# Patient Record
Sex: Male | Born: 1955 | Race: White | Hispanic: No | Marital: Single | Smoking: Former smoker
Health system: Southern US, Community
[De-identification: ages and names within clinical notes are randomized; demographics above are authoritative.]

## PROBLEM LIST (undated history)

## (undated) DIAGNOSIS — E785 Hyperlipidemia, unspecified: Secondary | ICD-10-CM

## (undated) DIAGNOSIS — I639 Cerebral infarction, unspecified: Secondary | ICD-10-CM

## (undated) DIAGNOSIS — I480 Paroxysmal atrial fibrillation: Secondary | ICD-10-CM

## (undated) DIAGNOSIS — I2119 ST elevation (STEMI) myocardial infarction involving other coronary artery of inferior wall: Secondary | ICD-10-CM

## (undated) DIAGNOSIS — I2510 Atherosclerotic heart disease of native coronary artery without angina pectoris: Secondary | ICD-10-CM

## (undated) DIAGNOSIS — I5042 Chronic combined systolic (congestive) and diastolic (congestive) heart failure: Secondary | ICD-10-CM

## (undated) DIAGNOSIS — I255 Ischemic cardiomyopathy: Secondary | ICD-10-CM

## (undated) DIAGNOSIS — F17200 Nicotine dependence, unspecified, uncomplicated: Secondary | ICD-10-CM

## (undated) DIAGNOSIS — I10 Essential (primary) hypertension: Secondary | ICD-10-CM

## (undated) DIAGNOSIS — E119 Type 2 diabetes mellitus without complications: Secondary | ICD-10-CM

## (undated) DIAGNOSIS — N183 Chronic kidney disease, stage 3 unspecified: Secondary | ICD-10-CM

## (undated) HISTORY — DX: Chronic kidney disease, stage 3 unspecified: N18.30

## (undated) HISTORY — DX: Ischemic cardiomyopathy: I25.5

## (undated) HISTORY — DX: Hyperlipidemia, unspecified: E78.5

## (undated) HISTORY — DX: Chronic combined systolic (congestive) and diastolic (congestive) heart failure: I50.42

## (undated) HISTORY — PX: CARDIAC CATHETERIZATION: SHX172

---

## 2012-02-19 ENCOUNTER — Ambulatory Visit: Payer: Self-pay | Admitting: Unknown Physician Specialty

## 2012-02-21 LAB — PATHOLOGY REPORT

## 2019-05-19 ENCOUNTER — Emergency Department: Payer: Medicaid Other

## 2019-05-19 ENCOUNTER — Other Ambulatory Visit: Payer: Self-pay

## 2019-05-19 ENCOUNTER — Encounter
Admission: EM | Disposition: A | Discharge status: Skilled Nursing Facility | Payer: Self-pay | Source: Home / Self Care | Attending: Internal Medicine

## 2019-05-19 ENCOUNTER — Inpatient Hospital Stay
Admission: EM | Admit: 2019-05-19 | Discharge: 2019-06-04 | DRG: 250 | Disposition: A | Discharge status: Skilled Nursing Facility | Payer: Medicaid Other | Attending: Internal Medicine | Admitting: Internal Medicine

## 2019-05-19 ENCOUNTER — Encounter: Payer: Self-pay | Admitting: Emergency Medicine

## 2019-05-19 DIAGNOSIS — N179 Acute kidney failure, unspecified: Secondary | ICD-10-CM | POA: Diagnosis present

## 2019-05-19 DIAGNOSIS — I480 Paroxysmal atrial fibrillation: Secondary | ICD-10-CM | POA: Diagnosis present

## 2019-05-19 DIAGNOSIS — Z20828 Contact with and (suspected) exposure to other viral communicable diseases: Secondary | ICD-10-CM | POA: Diagnosis present

## 2019-05-19 DIAGNOSIS — I5021 Acute systolic (congestive) heart failure: Secondary | ICD-10-CM

## 2019-05-19 DIAGNOSIS — R4701 Aphasia: Secondary | ICD-10-CM | POA: Diagnosis present

## 2019-05-19 DIAGNOSIS — N289 Disorder of kidney and ureter, unspecified: Secondary | ICD-10-CM

## 2019-05-19 DIAGNOSIS — I313 Pericardial effusion (noninflammatory): Secondary | ICD-10-CM | POA: Diagnosis present

## 2019-05-19 DIAGNOSIS — R233 Spontaneous ecchymoses: Secondary | ICD-10-CM | POA: Diagnosis present

## 2019-05-19 DIAGNOSIS — E785 Hyperlipidemia, unspecified: Secondary | ICD-10-CM | POA: Diagnosis present

## 2019-05-19 DIAGNOSIS — I2119 ST elevation (STEMI) myocardial infarction involving other coronary artery of inferior wall: Secondary | ICD-10-CM | POA: Diagnosis present

## 2019-05-19 DIAGNOSIS — I10 Essential (primary) hypertension: Secondary | ICD-10-CM

## 2019-05-19 DIAGNOSIS — T508X5A Adverse effect of diagnostic agents, initial encounter: Secondary | ICD-10-CM | POA: Diagnosis present

## 2019-05-19 DIAGNOSIS — I5043 Acute on chronic combined systolic (congestive) and diastolic (congestive) heart failure: Secondary | ICD-10-CM | POA: Diagnosis present

## 2019-05-19 DIAGNOSIS — Z720 Tobacco use: Secondary | ICD-10-CM

## 2019-05-19 DIAGNOSIS — I213 ST elevation (STEMI) myocardial infarction of unspecified site: Secondary | ICD-10-CM

## 2019-05-19 DIAGNOSIS — W19XXXA Unspecified fall, initial encounter: Secondary | ICD-10-CM | POA: Diagnosis present

## 2019-05-19 DIAGNOSIS — N141 Nephropathy induced by other drugs, medicaments and biological substances: Secondary | ICD-10-CM | POA: Diagnosis present

## 2019-05-19 DIAGNOSIS — E872 Acidosis: Secondary | ICD-10-CM | POA: Diagnosis not present

## 2019-05-19 DIAGNOSIS — I6330 Cerebral infarction due to thrombosis of unspecified cerebral artery: Secondary | ICD-10-CM

## 2019-05-19 DIAGNOSIS — E1122 Type 2 diabetes mellitus with diabetic chronic kidney disease: Secondary | ICD-10-CM | POA: Diagnosis present

## 2019-05-19 DIAGNOSIS — I2510 Atherosclerotic heart disease of native coronary artery without angina pectoris: Secondary | ICD-10-CM

## 2019-05-19 DIAGNOSIS — Z23 Encounter for immunization: Secondary | ICD-10-CM | POA: Diagnosis not present

## 2019-05-19 DIAGNOSIS — I255 Ischemic cardiomyopathy: Secondary | ICD-10-CM | POA: Diagnosis present

## 2019-05-19 DIAGNOSIS — I130 Hypertensive heart and chronic kidney disease with heart failure and stage 1 through stage 4 chronic kidney disease, or unspecified chronic kidney disease: Secondary | ICD-10-CM | POA: Diagnosis present

## 2019-05-19 DIAGNOSIS — F17210 Nicotine dependence, cigarettes, uncomplicated: Secondary | ICD-10-CM | POA: Diagnosis present

## 2019-05-19 DIAGNOSIS — G8191 Hemiplegia, unspecified affecting right dominant side: Secondary | ICD-10-CM | POA: Diagnosis present

## 2019-05-19 DIAGNOSIS — G9340 Encephalopathy, unspecified: Secondary | ICD-10-CM | POA: Diagnosis present

## 2019-05-19 DIAGNOSIS — I4891 Unspecified atrial fibrillation: Secondary | ICD-10-CM

## 2019-05-19 DIAGNOSIS — Z9114 Patient's other noncompliance with medication regimen: Secondary | ICD-10-CM

## 2019-05-19 DIAGNOSIS — N1831 Chronic kidney disease, stage 3a: Secondary | ICD-10-CM | POA: Diagnosis present

## 2019-05-19 DIAGNOSIS — N1832 Chronic kidney disease, stage 3b: Secondary | ICD-10-CM

## 2019-05-19 DIAGNOSIS — D631 Anemia in chronic kidney disease: Secondary | ICD-10-CM | POA: Diagnosis present

## 2019-05-19 DIAGNOSIS — N189 Chronic kidney disease, unspecified: Secondary | ICD-10-CM

## 2019-05-19 DIAGNOSIS — R482 Apraxia: Secondary | ICD-10-CM | POA: Diagnosis present

## 2019-05-19 DIAGNOSIS — I63512 Cerebral infarction due to unspecified occlusion or stenosis of left middle cerebral artery: Secondary | ICD-10-CM | POA: Diagnosis present

## 2019-05-19 DIAGNOSIS — R29702 NIHSS score 2: Secondary | ICD-10-CM | POA: Diagnosis present

## 2019-05-19 DIAGNOSIS — I2111 ST elevation (STEMI) myocardial infarction involving right coronary artery: Secondary | ICD-10-CM

## 2019-05-19 DIAGNOSIS — I639 Cerebral infarction, unspecified: Secondary | ICD-10-CM

## 2019-05-19 DIAGNOSIS — I5020 Unspecified systolic (congestive) heart failure: Secondary | ICD-10-CM

## 2019-05-19 HISTORY — DX: Essential (primary) hypertension: I10

## 2019-05-19 HISTORY — DX: ST elevation (STEMI) myocardial infarction involving other coronary artery of inferior wall: I21.19

## 2019-05-19 HISTORY — DX: Nicotine dependence, unspecified, uncomplicated: F17.200

## 2019-05-19 HISTORY — PX: LEFT HEART CATH AND CORONARY ANGIOGRAPHY: CATH118249

## 2019-05-19 HISTORY — PX: CORONARY/GRAFT ACUTE MI REVASCULARIZATION: CATH118305

## 2019-05-19 HISTORY — DX: Cerebral infarction, unspecified: I63.9

## 2019-05-19 HISTORY — PX: CORONARY BALLOON ANGIOPLASTY: CATH118233

## 2019-05-19 HISTORY — DX: Paroxysmal atrial fibrillation: I48.0

## 2019-05-19 HISTORY — DX: Type 2 diabetes mellitus without complications: E11.9

## 2019-05-19 HISTORY — DX: Atherosclerotic heart disease of native coronary artery without angina pectoris: I25.10

## 2019-05-19 LAB — CBC
HCT: 38.3 % — ABNORMAL LOW (ref 39.0–52.0)
Hemoglobin: 13.3 g/dL (ref 13.0–17.0)
MCH: 30.2 pg (ref 26.0–34.0)
MCHC: 34.7 g/dL (ref 30.0–36.0)
MCV: 86.8 fL (ref 80.0–100.0)
Platelets: 304 10*3/uL (ref 150–400)
RBC: 4.41 MIL/uL (ref 4.22–5.81)
RDW: 13.5 % (ref 11.5–15.5)
WBC: 11.7 10*3/uL — ABNORMAL HIGH (ref 4.0–10.5)
nRBC: 0 % (ref 0.0–0.2)

## 2019-05-19 LAB — PROTIME-INR
INR: 1.3 — ABNORMAL HIGH (ref 0.8–1.2)
Prothrombin Time: 16.2 seconds — ABNORMAL HIGH (ref 11.4–15.2)

## 2019-05-19 LAB — URINALYSIS, COMPLETE (UACMP) WITH MICROSCOPIC
Bacteria, UA: NONE SEEN
Bilirubin Urine: NEGATIVE
Glucose, UA: NEGATIVE mg/dL
Hgb urine dipstick: NEGATIVE
Ketones, ur: NEGATIVE mg/dL
Leukocytes,Ua: NEGATIVE
Nitrite: NEGATIVE
Protein, ur: NEGATIVE mg/dL
Specific Gravity, Urine: 1.012 (ref 1.005–1.030)
pH: 5 (ref 5.0–8.0)

## 2019-05-19 LAB — SARS CORONAVIRUS 2 BY RT PCR (HOSPITAL ORDER, PERFORMED IN ~~LOC~~ HOSPITAL LAB): SARS Coronavirus 2: NEGATIVE

## 2019-05-19 LAB — TROPONIN I (HIGH SENSITIVITY)
Troponin I (High Sensitivity): 3929 ng/L (ref <18)
Troponin I (High Sensitivity): 3682 ng/L (ref <18)
Troponin I (High Sensitivity): 3729 ng/L (ref <18)

## 2019-05-19 LAB — COMPREHENSIVE METABOLIC PANEL
ALT: 16 U/L (ref 0–44)
AST: 21 U/L (ref 15–41)
Albumin: 2.9 g/dL — ABNORMAL LOW (ref 3.5–5.0)
Alkaline Phosphatase: 76 U/L (ref 38–126)
Anion gap: 15 (ref 5–15)
BUN: 32 mg/dL — ABNORMAL HIGH (ref 8–23)
CO2: 16 mmol/L — ABNORMAL LOW (ref 22–32)
Calcium: 8.5 mg/dL — ABNORMAL LOW (ref 8.9–10.3)
Chloride: 104 mmol/L (ref 98–111)
Creatinine, Ser: 1.78 mg/dL — ABNORMAL HIGH (ref 0.61–1.24)
GFR calc Af Amer: 46 mL/min — ABNORMAL LOW (ref >60)
GFR calc non Af Amer: 39 mL/min — ABNORMAL LOW (ref >60)
Glucose, Bld: 155 mg/dL — ABNORMAL HIGH (ref 70–99)
Potassium: 3.2 mmol/L — ABNORMAL LOW (ref 3.5–5.1)
Sodium: 135 mmol/L (ref 135–145)
Total Bilirubin: 1 mg/dL (ref 0.3–1.2)
Total Protein: 6.5 g/dL (ref 6.5–8.1)

## 2019-05-19 LAB — POCT ACTIVATED CLOTTING TIME
Activated Clotting Time: 219 seconds
Activated Clotting Time: 235 seconds

## 2019-05-19 LAB — GLUCOSE, CAPILLARY: Glucose-Capillary: 100 mg/dL — ABNORMAL HIGH (ref 70–99)

## 2019-05-19 LAB — CARDIAC CATHETERIZATION: Cath EF Quantitative: 30 %

## 2019-05-19 LAB — APTT: aPTT: >160 seconds — ABNORMAL HIGH (ref 24–36)

## 2019-05-19 LAB — MRSA PCR SCREENING: MRSA by PCR: NEGATIVE

## 2019-05-19 SURGERY — CORONARY/GRAFT ACUTE MI REVASCULARIZATION
Anesthesia: Moderate Sedation

## 2019-05-19 MED ORDER — METOPROLOL TARTRATE 5 MG/5ML IV SOLN
INTRAVENOUS | Status: AC
  Filled 2019-05-19: qty 5

## 2019-05-19 MED ORDER — SODIUM CHLORIDE 0.9% FLUSH: 3.0000 mL | INTRAVENOUS | Status: DC | PRN

## 2019-05-19 MED ORDER — ATORVASTATIN CALCIUM 20 MG PO TABS
80.0000 mg | ORAL_TABLET | Freq: Every day | ORAL | Status: DC
  Administered 2019-05-19: 80 mg via ORAL
  Filled 2019-05-19: qty 4

## 2019-05-19 MED ORDER — SODIUM CHLORIDE 0.9 % IV SOLN: 250.0000 mL | INTRAVENOUS | Status: DC | PRN

## 2019-05-19 MED ORDER — NITROGLYCERIN 1 MG/10 ML FOR IR/CATH LAB
INTRA_ARTERIAL | Status: AC
  Filled 2019-05-19: qty 10

## 2019-05-19 MED ORDER — DILTIAZEM HCL 25 MG/5ML IV SOLN
INTRAVENOUS | Status: AC
  Administered 2019-05-19: 20 mg via INTRAVENOUS
  Filled 2019-05-19: qty 5

## 2019-05-19 MED ORDER — MIDAZOLAM HCL 2 MG/2ML IJ SOLN
INTRAMUSCULAR | Status: DC | PRN
  Administered 2019-05-19: 1 mg via INTRAVENOUS

## 2019-05-19 MED ORDER — DILTIAZEM HCL 25 MG/5ML IV SOLN
20.0000 mg | Freq: Once | INTRAVENOUS | Status: AC
  Administered 2019-05-19: 11:00:00 20 mg via INTRAVENOUS

## 2019-05-19 MED ORDER — VERAPAMIL HCL 2.5 MG/ML IV SOLN
INTRAVENOUS | Status: DC | PRN
  Administered 2019-05-19: 2.5 mg via INTRA_ARTERIAL

## 2019-05-19 MED ORDER — ONDANSETRON HCL 4 MG/2ML IJ SOLN: 4.0000 mg | Freq: Four times a day (QID) | INTRAMUSCULAR | Status: DC | PRN

## 2019-05-19 MED ORDER — HEPARIN SODIUM (PORCINE) 1000 UNIT/ML IJ SOLN
INTRAMUSCULAR | Status: AC
  Filled 2019-05-19: qty 1

## 2019-05-19 MED ORDER — METOPROLOL TARTRATE 5 MG/5ML IV SOLN
INTRAVENOUS | Status: DC | PRN
  Administered 2019-05-19: 2.5 mg via INTRAVENOUS

## 2019-05-19 MED ORDER — NITROGLYCERIN 1 MG/10 ML FOR IR/CATH LAB
INTRA_ARTERIAL | Status: DC | PRN
  Administered 2019-05-19: 200 ug
  Administered 2019-05-19: 200 ug via INTRACORONARY

## 2019-05-19 MED ORDER — ACETAMINOPHEN 325 MG PO TABS: 650.0000 mg | ORAL_TABLET | ORAL | Status: DC | PRN

## 2019-05-19 MED ORDER — ASPIRIN 81 MG PO CHEW
81.0000 mg | CHEWABLE_TABLET | Freq: Every day | ORAL | Status: DC
  Administered 2019-05-19 – 2019-05-22 (×4): 81 mg via ORAL
  Filled 2019-05-19 (×5): qty 1

## 2019-05-19 MED ORDER — HEPARIN (PORCINE) IN NACL 1000-0.9 UT/500ML-% IV SOLN
INTRAVENOUS | Status: AC
  Filled 2019-05-19: qty 1000

## 2019-05-19 MED ORDER — HEPARIN BOLUS VIA INFUSION
4000.0000 [IU] | Freq: Once | INTRAVENOUS | Status: DC
  Filled 2019-05-19: qty 4000

## 2019-05-19 MED ORDER — HEPARIN (PORCINE) IN NACL 1000-0.9 UT/500ML-% IV SOLN
INTRAVENOUS | Status: DC | PRN
  Administered 2019-05-19: 500 mL

## 2019-05-19 MED ORDER — CLOPIDOGREL BISULFATE 75 MG PO TABS
600.0000 mg | ORAL_TABLET | Freq: Once | ORAL | Status: AC
  Administered 2019-05-19: 600 mg via ORAL
  Filled 2019-05-19: qty 8

## 2019-05-19 MED ORDER — MIDAZOLAM HCL 2 MG/2ML IJ SOLN
INTRAMUSCULAR | Status: AC
  Filled 2019-05-19: qty 2

## 2019-05-19 MED ORDER — IOHEXOL 300 MG/ML  SOLN
INTRAMUSCULAR | Status: DC | PRN
  Administered 2019-05-19: 150 mL

## 2019-05-19 MED ORDER — CHLORHEXIDINE GLUCONATE CLOTH 2 % EX PADS
6.0000 | MEDICATED_PAD | Freq: Every day | CUTANEOUS | Status: DC
  Administered 2019-05-21 – 2019-06-03 (×11): 6 via TOPICAL

## 2019-05-19 MED ORDER — SODIUM CHLORIDE 0.9 % IV SOLN: INTRAVENOUS | Status: AC

## 2019-05-19 MED ORDER — CLOPIDOGREL BISULFATE 75 MG PO TABS
75.0000 mg | ORAL_TABLET | Freq: Every day | ORAL | Status: DC
  Administered 2019-05-20 – 2019-06-03 (×15): 75 mg via ORAL
  Filled 2019-05-19 (×15): qty 1

## 2019-05-19 MED ORDER — INFLUENZA VAC SPLIT QUAD 0.5 ML IM SUSY
0.5000 mL | PREFILLED_SYRINGE | INTRAMUSCULAR | Status: AC
  Administered 2019-05-22: 0.5 mL via INTRAMUSCULAR
  Filled 2019-05-19: qty 0.5

## 2019-05-19 MED ORDER — DILTIAZEM HCL 100 MG IV SOLR
5.0000 mg/h | INTRAVENOUS | Status: DC
  Administered 2019-05-19: 5 mg/h via INTRAVENOUS
  Filled 2019-05-19 (×2): qty 100

## 2019-05-19 MED ORDER — FENTANYL CITRATE (PF) 100 MCG/2ML IJ SOLN
INTRAMUSCULAR | Status: AC
  Filled 2019-05-19: qty 2

## 2019-05-19 MED ORDER — SODIUM CHLORIDE 0.9% FLUSH
3.0000 mL | Freq: Two times a day (BID) | INTRAVENOUS | Status: DC
  Administered 2019-05-20 – 2019-06-03 (×28): 3 mL via INTRAVENOUS

## 2019-05-19 MED ORDER — METOPROLOL TARTRATE 25 MG PO TABS
25.0000 mg | ORAL_TABLET | Freq: Four times a day (QID) | ORAL | Status: DC
  Administered 2019-05-19 – 2019-05-20 (×3): 25 mg via ORAL
  Filled 2019-05-19 (×3): qty 1

## 2019-05-19 MED ORDER — SODIUM CHLORIDE 0.9 % IV BOLUS
1000.0000 mL | Freq: Once | INTRAVENOUS | Status: AC
  Administered 2019-05-19: 1000 mL via INTRAVENOUS

## 2019-05-19 MED ORDER — FENTANYL CITRATE (PF) 100 MCG/2ML IJ SOLN
INTRAMUSCULAR | Status: DC | PRN
  Administered 2019-05-19: 25 ug via INTRAVENOUS

## 2019-05-19 MED ORDER — HEPARIN (PORCINE) 25000 UT/250ML-% IV SOLN
800.0000 [IU]/h | INTRAVENOUS | Status: DC
  Filled 2019-05-19: qty 250

## 2019-05-19 MED ORDER — HEPARIN (PORCINE) 25000 UT/250ML-% IV SOLN
1300.0000 [IU]/h | INTRAVENOUS | Status: DC
  Administered 2019-05-19: 22:00:00 800 [IU]/h via INTRAVENOUS
  Administered 2019-05-21: 1300 [IU]/h via INTRAVENOUS
  Filled 2019-05-19 (×2): qty 250

## 2019-05-19 MED ORDER — VERAPAMIL HCL 2.5 MG/ML IV SOLN
INTRAVENOUS | Status: AC
  Filled 2019-05-19: qty 2

## 2019-05-19 MED ORDER — HEPARIN SODIUM (PORCINE) 1000 UNIT/ML IJ SOLN
INTRAMUSCULAR | Status: DC | PRN
  Administered 2019-05-19: 4000 [IU] via INTRAVENOUS
  Administered 2019-05-19: 3000 [IU] via INTRAVENOUS

## 2019-05-19 SURGICAL SUPPLY — 15 items
BALLN MINITREK RX 2.0X15 (BALLOONS) ×3
BALLN TREK RX 2.5X12 (BALLOONS) ×3
BALLN TREK RX 2.5X20 (BALLOONS) ×3
BALLOON MINITREK RX 2.0X15 (BALLOONS) ×1 IMPLANT
BALLOON TREK RX 2.5X12 (BALLOONS) ×1 IMPLANT
BALLOON TREK RX 2.5X20 (BALLOONS) ×1 IMPLANT
CATH INFINITI 5FR JK (CATHETERS) ×3 IMPLANT
CATH VISTA GUIDE 6FR JR4 (CATHETERS) ×3 IMPLANT
DEVICE INFLAT 30 PLUS (MISCELLANEOUS) ×3 IMPLANT
DEVICE RAD TR BAND REGULAR (VASCULAR PRODUCTS) ×3 IMPLANT
GLIDESHEATH SLEND SS 6F .021 (SHEATH) ×3 IMPLANT
KIT MANI 3VAL PERCEP (MISCELLANEOUS) ×3 IMPLANT
PACK CARDIAC CATH (CUSTOM PROCEDURE TRAY) ×3 IMPLANT
WIRE ROSEN-J .035X260CM (WIRE) ×3 IMPLANT
WIRE RUNTHROUGH .014X180CM (WIRE) ×3 IMPLANT

## 2019-05-19 NOTE — ED Notes (Signed)
Cardiologist at bedside.  

## 2019-05-19 NOTE — ED Provider Notes (Signed)
[facility] Emergency Department Provider Note  Time seen: 10:26 AM  I have reviewed the triage vital signs and the nursing notes.   HISTORY  Chief Complaint Weakness   HPI Gordon [name] is a 63 y.o. male with a past medical history of hypertension not taking any medications currently presents to the emergency department for generalized weakness and confusion.  According to the patient since early this morning he has been feeling very weak, states he went to try to get out of bed today and fell to the ground.  States he just does not feel right but denies any pain.  Denies any headache.  Denies any loss of consciousness.  Denies any chest pain or palpitations however the patient was noted to be in atrial fibrillation at 150 bpm upon arrival.  No history of atrial fibrillation in the past per patient.  Denies any focal weakness or numbness, states a sensation of generalized fatigue.   No past medical history on file.  There are no active problems to display for this patient.   Prior to Admission medications   Not on File    Not on File  No family history on file.  Social History Social History   Tobacco Use  . Smoking status: Not on file  Substance Use Topics  . Alcohol use: Not on file  . Drug use: Not on file    Review of Systems Constitutional: Negative for fever. Cardiovascular: Negative for chest pain.  Negative for palpitations. Respiratory: Negative for shortness of breath.  Negative for cough. Gastrointestinal: Negative for abdominal pain, vomiting and diarrhea. Musculoskeletal: Negative for musculoskeletal complaints Neurological: Negative for headache All other ROS negative  ____________________________________________   PHYSICAL EXAM:  VITAL SIGNS: ED Triage Vitals  Enc Vitals Group     BP 05/19/19 1017 (!) 184/126     Pulse Rate 05/19/19 1017 (!) 151     Resp 05/19/19 1017 20     Temp --      Temp src --      SpO2 05/19/19  1017 97 %     Weight 05/19/19 1018 160 lb (72.6 kg)     Height 05/19/19 1018 5\' 6"  (1.676 m)     Head Circumference --      Peak Flow --      Pain Score 05/19/19 1018 0     Pain Loc --      Pain Edu? --      Excl. in [place]? --    Constitutional: Alert and oriented. Well appearing and in no distress. Eyes: Normal exam ENT      Head: Normocephalic and atraumatic.      Mouth/Throat: Mucous membranes are moist. Cardiovascular: Irregular rhythm rate around 150+ beats per minute.  No obvious murmur. Respiratory: Normal respiratory effort without tachypnea nor retractions. Breath sounds are clear Gastrointestinal: Soft and nontender. No distention. Musculoskeletal: Nontender with normal range of motion in all extremities.  Neurologic:  Normal speech and language. No gross focal neurologic deficits  Skin:  Skin is warm, dry and intact.  Psychiatric: Mood and affect are normal.   ____________________________________________    EKG  EKG viewed and interpreted by myself shows atrial fibrillation 140 bpm with a widened QRS, normal axis, largely normal intervals nonspecific ST changes.  No old EKG for comparison.  ____________________________________________    RADIOLOGY  CT head does not appear to show any acute abnormality.  ____________________________________________   INITIAL IMPRESSION / ASSESSMENT AND PLAN / ED COURSE  Pertinent labs & imaging results that were available during my care of the patient were reviewed by me and considered in my medical decision making (see chart for details).   Patient presents to the emergency department for generalized weakness, noted to be in atrial fibrillation around 150 bpm as high as 170 bpm at times.  Patient is hypertensive we will dose 20 mg of diltiazem, IV fluids and continue to closely monitor.  We will check labs including cardiac enzymes.  Patient's EKG shows nonspecific findings somewhat concerning in the inferior leads however given  the rate and likely demand ischemia we will repeat an EKG once we are able to slow the rate with diltiazem and reassess.  Patient denies any chest pain.  Repeat EKG viewed and interpreted by myself continues to show what appears to be ST elevation in the inferior leads.  I discussed patient with Dr. Fletcher Anon of cardiology has come down to see the patient.  Troponin has since resolved to greater than 3000 consistent with STEMI versus NSTEMI.  Given the patient's EKG findings Dr. Fletcher Anon will be taking emergently to the Cath Lab.  Tabor Trussell was evaluated in Emergency Department on 05/19/2019 for the symptoms described in the history of present illness. He was evaluated in the context of the global COVID-19 pandemic, which necessitated consideration that the patient might be at risk for infection with the SARS-CoV-2 virus that causes COVID-19. Institutional protocols and algorithms that pertain to the evaluation of patients at risk for COVID-19 are in a state of rapid change based on information released by regulatory bodies including the CDC and federal and state organizations. These policies and algorithms were followed during the patient's care in the ED.  CRITICAL CARE Performed by: Harvest Dark   Total critical care time: 45 minutes  Critical care time was exclusive of separately billable procedures and treating other patients.  Critical care was necessary to treat or prevent imminent or life-threatening deterioration.  Critical care was time spent personally by me on the following activities: development of treatment plan with patient and/or surrogate as well as nursing, discussions with consultants, evaluation of patient's response to treatment, examination of patient, obtaining history from patient or surrogate, ordering and performing treatments and interventions, ordering and review of laboratory studies, ordering and review of radiographic studies, pulse oximetry and re-evaluation of  patient's condition.   ____________________________________________   FINAL CLINICAL IMPRESSION(S) / ED DIAGNOSES  Weakness Atrial fibrillation with rapid ventricular response STEMI   Harvest Dark, MD 05/19/19 1300

## 2019-05-19 NOTE — Progress Notes (Signed)
   05/19/19 1600  Clinical Encounter Type  Visited With Patient and family together  Visit Type Follow-up  Referral From Nurse  Spiritual Encounters  Spiritual Needs Other (Comment) (Advance Directives)  Ch received an OR for AD education. Upon entering room, ch found the pt lying comfortably, and sister at bedside. Pt was recovering from the procedure and slightly confused. Pt requested that the AD education be done at a later time. Ch informed them how they can reach a chaplain. Chaplain assistance will be available upon request.

## 2019-05-19 NOTE — ED Triage Notes (Addendum)
Pt arrival via [facility] from home. EMS states that the patient called due to feeling confused. Pt states he has been 'non-compliant with his HTN medications'. Pt states having chest pain 3 days ago.  Pt had two medication bottles with unknown pills in them that are under his sisters name, pt states he doesn't know what they are.

## 2019-05-19 NOTE — H&P (Addendum)
Cardiology Admission History and Physical:   Patient ID: Gordon [name] MRN: [id number]; DOB: 07/09/1955   Admission date: 05/19/2019  Primary Care Provider: System, Pcp Not In Primary Cardiologist:new Fletcher Anon) Primary Electrophysiologist:  None   Chief Complaint: Chest pain and weakness  Patient Profile:   Gordon [name] is a 63 y.o. male with past medical history of essential hypertension currently not taking medications and tobacco use who presented with chest pain and generalized weakness.  History of Present Illness:   Mr. Gordon [name] is a 63 year old male with known history of tobacco use and no prior cardiac history.  He reports known history of essential hypertension but he has not taken any medications or seen a physician in many years.  He lives by himself.  He reports intermittent substernal chest pain described as tightness feeling which started about 1 week ago.  This has been happening more frequently and became severe this morning and thus he called EMS.  His sister was present at the bedside and reported that the patient did not look good during Thanksgiving dinner and he did complain of chest pain at that time and weakness but did not want to seek medical attention.  However, his symptoms worsened this morning and he also had periods of confusion with significant shortness of breath and dizziness.  Thus, he called EMS.  He was noted to be in A. fib with RVR and was hypertensive on presentation.  His initial EKG showed inferior ST elevation but the code STEMI was not called given concerns about his mental status and A. fib with RVR.  Subsequently, his troponin came back elevated at 3500 and the patient was given diltiazem.  EKG was repeated and the inferior ST elevation was still persistent.  I was contacted at this point and given the significant EKG changes in the patient's symptoms, I activated a code STEMI.  However, given the patient's intermittent confusion, I recommended obtaining a CT  scan of the head before proceeding with cath.  This was done and showed no evidence of bleeding.  The patient was given aspirin, heparin bolus and was placed on diltiazem drip for rate control.  Heart Pathway Score:     Past Medical History:  Diagnosis Date  . Hypertension     History reviewed. No pertinent surgical history.   Medications Prior to Admission: Prior to Admission medications   Not on File     Allergies:   Not on File  Social History:   Social History   Socioeconomic History  . Marital status: Single    Spouse name: Not on file  . Number of children: Not on file  . Years of education: Not on file  . Highest education level: Not on file  Occupational History  . Not on file  Social Needs  . Financial resource strain: Not on file  . Food insecurity    Worry: Not on file    Inability: Not on file  . Transportation needs    Medical: Not on file    Non-medical: Not on file  Tobacco Use  . Smoking status: Current Every Day Smoker    Packs/day: 0.50    Types: Cigarettes  Substance and Sexual Activity  . Alcohol use: Not on file  . Drug use: Not on file  . Sexual activity: Not on file  Lifestyle  . Physical activity    Days per week: Not on file    Minutes per session: Not on file  . Stress: Not on file  Relationships  . Social Herbalist on phone: Not on file    Gets together: Not on file    Attends religious service: Not on file    Active member of club or organization: Not on file    Attends meetings of clubs or organizations: Not on file    Relationship status: Not on file  . Intimate partner violence    Fear of current or ex partner: Not on file    Emotionally abused: Not on file    Physically abused: Not on file    Forced sexual activity: Not on file  Other Topics Concern  . Not on file  Social History Narrative  . Not on file    Family History:   The patient could not provide family history due to mental status change and  confusion.  I specifically asked him and he said he did not know.  ROS:  Please see the history of present illness.  All other ROS reviewed and negative.     Physical Exam/Data:   Vitals:   05/19/19 1115 05/19/19 1130 05/19/19 1145 05/19/19 1301  BP: (!) 158/98 (!) 142/92 (!) 161/109   Pulse: (!) 138 (!) 139 (!) 131   Resp: (!) 24 (!) 28 (!) 27   SpO2: 100% 100% 100% 100%  Weight:      Height:       No intake or output data in the 24 hours ending 05/19/19 1441 Last 3 Weights 05/19/2019  Weight (lbs) 160 lb  Weight (kg) 72.576 kg     Body mass index is 25.82 kg/m.  General:  Well nourished, well developed, in no acute distress HEENT: normal Lymph: no adenopathy Neck: no JVD Endocrine:  No thryomegaly Vascular: No carotid bruits; FA pulses 2+ bilaterally without bruits  Cardiac: Irregularly irregular and tachycardic; ; no murmur  Lungs:  clear to auscultation bilaterally, no wheezing, rhonchi or rales  Abd: soft, nontender, no hepatomegaly  Ext: no edema Musculoskeletal:  No deformities, BUE and BLE strength normal and equal Skin: warm and dry  Neuro:  CNs 2-12 intact, no focal abnormalities noted Psych:  Normal affect    EKG:  The ECG that was done.  EKG was personally reviewed and showed atrial fibrillation with RVR with inferior ST elevation   Relevant CV Studies: Cardiac catheterization was done emergently:  1.  Codominant coronary arteries with severe one-vessel coronary artery disease.  The culprit is an occluded proximal right coronary artery which is diffusely diseased and heavily calcified.  Left to right collaterals were noted. 2.  Moderately to severely reduced LV systolic function with an EF of 30 to 35% with akinesis of the inferior wall. 3.  Mildly elevated left ventricular end-diastolic pressure at 21 mmHg. 4.  Unsuccessful balloon angioplasty of the right coronary artery given diffuse disease, calcifications and thrombus.  This was in spite of multiple  balloon inflations throughout the whole vessel.  Recommendations: Based on the patient's history and angiographic findings, I suspect that this is a late presentation inferior ST elevation myocardial infarction or acute on chronic presentation.  Given late presentation, underlying renal failure of unknown chronicity and the fact that the patient was chest pain-free, I elected not to prolong the procedure anymore and accepted results. Recommend medical therapy.  I was also concerned about the patient's continuous right arm movement in spite of instructed him not to do so.  Laboratory Data:  High Sensitivity Troponin:   Recent Labs  Lab 05/19/19 1026  TROPONINIHS 3,929*      Chemistry Recent Labs  Lab 05/19/19 1026  NA 135  K 3.2*  CL 104  CO2 16*  GLUCOSE 155*  BUN 32*  CREATININE 1.78*  CALCIUM 8.5*  GFRNONAA 39*  GFRAA 46*  ANIONGAP 15    Recent Labs  Lab 05/19/19 1026  PROT 6.5  ALBUMIN 2.9*  AST 21  ALT 16  ALKPHOS 76  BILITOT 1.0   Hematology Recent Labs  Lab 05/19/19 1026  WBC 11.7*  RBC 4.41  HGB 13.3  HCT 38.3*  MCV 86.8  MCH 30.2  MCHC 34.7  RDW 13.5  PLT 304   BNPNo results for input(s): BNP, PROBNP in the last 168 hours.  DDimer No results for input(s): DDIMER in the last 168 hours.   Radiology/Studies:  Ct Head Wo Contrast  Result Date: 05/19/2019 CLINICAL DATA:  Encephalopathy EXAM: CT HEAD WITHOUT CONTRAST TECHNIQUE: Contiguous axial images were obtained from the base of the skull through the vertex without intravenous contrast. COMPARISON:  None FINDINGS: Brain: There is no acute intracranial hemorrhage, mass-effect, or edema. Gordon-white differentiation is preserved. There is no extra-axial fluid collection. Patchy and confluent hypoattenuation in the supratentorial white matter is nonspecific but may reflect moderate chronic microvascular ischemic changes. Small age-indeterminate infarct of the lateral right cerebellum. Ventricles and sulci  are within normal limits in size and configuration. Vascular: There is atherosclerotic calcification at the skull base. Skull: Calvarium is unremarkable. Sinuses/Orbits: Circumferential left sphenoid sinus mucosal thickening with occluded outflow and mild mucoperiosteal thickening. Orbits are unremarkable. Other: Mastoid air cells are clear. IMPRESSION: No acute intracranial hemorrhage, mass effect, or evidence of acute infarction. Moderate chronic microvascular ischemic changes. Small age-indeterminate infarct of right cerebellum. 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 Electronically Signed   By: Macy Mis M.D.   On: 05/19/2019 12:45   Dg Chest Portable 1 View  Result Date: 05/19/2019 CLINICAL DATA:  Altered mental status EXAM: PORTABLE CHEST 1 VIEW COMPARISON:  None. FINDINGS: Mild atelectasis at the left lung base. No pleural effusion or pneumothorax. Heart size is normal. IMPRESSION: Mild atelectasis at the left lung base. Electronically Signed   By: Macy Mis M.D.   On: 05/19/2019 11:02    Assessment and Plan:   1. Inferior ST elevation myocardial infarction: Likely with late presentation given elevated troponin and Q waves on EKG.  Emergent cardiac catheterization was performed which showed occluded codominant right coronary artery with left-to-right collaterals.  The vessel was noted to be heavily calcified with diffuse disease and thrombus.  In spite of multiple balloon inflations throughout the whole vessel, optimal flow could not be achieved and I elected not to continue with stenting as that might have required a full metal jacket likely with suboptimal results due to underexpansion related to calcifications.  Given that the patient was chest pain-free with left to right collaterals and the fact that he was a late presenter, I elected to accept balloon angioplasty result with TIMI II flow.  Ongoing with the patient with clopidogrel.  Recommend cardiac rehab at discharge.  2.  Atrial  fibrillation with rapid ventricular response: Continue diltiazem drip for now for rate control but the plan is not to send him home on diltiazem given ischemic cardiomyopathy.  I started oral metoprolol.  Resume heparin drip 8 hours after sheath pull with plans to transition to a DOAC before hospital discharge.  Cardioversion can be considered in the outpatient setting after 3 weeks of anticoagulation or TEE guided cardioversion can  be done if were not able to achieve optimal rate control.  3.  Acute systolic heart failure due to ischemic cardiomyopathy: EF was 30 to 35% by LV gram but the patient was very tachycardic at that time.  Obtain an echocardiogram tomorrow.  Transition metoprolol to Toprol before hospital discharge and had an ARB if renal function is stable.  4.  Renal failure: Unknown chronicity.  Gentle hydration post cardiac cath and repeat labs tomorrow morning.  5.  Essential hypertension: The patient continues to be hypertensive and will add medications as needed.  6.  Tobacco use: I will discuss with the patient smoking cessation.    Severity of Illness: The appropriate patient status for this patient is INPATIENT. Inpatient status is judged to be reasonable and necessary in order to provide the required intensity of service to ensure the patient's safety. The patient's presenting symptoms, physical exam findings, and initial radiographic and laboratory data in the context of their chronic comorbidities is felt to place them at high risk for further clinical deterioration. Furthermore, it is not anticipated that the patient will be medically stable for discharge from the hospital within 2 midnights of admission. The following factors support the patient status of inpatient.   " The patient's presenting symptoms include chest pain and shortness of breath. " The worrisome physical exam findings include significant tachycardia. " The initial radiographic and laboratory data are  worrisome because of presentation consistent with inferior ST elevation myocardial infarction as well as A. fib with RVR. " The chronic co-morbidities include essential hypertension and tobacco use.   * I certify that at the point of admission it is my clinical judgment that the patient will require inpatient hospital care spanning beyond 2 midnights from the point of admission due to high intensity of service, high risk for further deterioration and high frequency of surveillance required.*    For questions or updates, please contact [place] Please consult [email or web address] for contact info under        Signed, Kathlyn Sacramento, MD  05/19/2019 2:41 PM

## 2019-05-19 NOTE — Progress Notes (Signed)
   05/19/19 1200  Clinical Encounter Type  Visited With Patient and family together  Visit Type Initial;Code  Referral From Nurse  Stress Factors  Patient Stress Factors Health changes  Family Stress Factors Health changes  Ch received a call for Code Stemi. Pt has just returned from CT scan and pt's sister was at bedside. Sister debriefed the ch on what has happened to the pt. Sister seemed glad to have the pastoral support. Sister relies on her faith to maintain her calm. Pt seemed pretty calm as well but he was quickly moved to Cath Lab for the upcoming procedure. Ch escorted the sister to the waiting area and will check on her later.

## 2019-05-19 NOTE — ED Notes (Signed)
Verbal given by Dr. Fletcher Anon for 4000 units Heparin IV

## 2019-05-19 NOTE — ED Notes (Signed)
Pt A&Ox4 in NAD, pt stood up to use the urinal and his HR went up to 136, pt reports no chest pain at this time. Dr. Kerman Passey informed

## 2019-05-19 NOTE — ED Notes (Signed)
Code cart at bedside, pads placed

## 2019-05-19 NOTE — ED Notes (Signed)
Pt chewed 324 mg ASA

## 2019-05-19 NOTE — Progress Notes (Signed)
ANTICOAGULATION CONSULT NOTE  Pharmacy Consult for heparin Indication: chest pain/ACS (NSTEMI)  Not on File  Patient Measurements: Height: 5\' 8"  (172.7 cm) Weight: 161 lb 6 oz (73.2 kg) IBW/kg (Calculated) : 68.4 Heparin Dosing Weight: 72 kg  Vital Signs: Temp: 97.8 F (36.6 C) (12/07 1500) Temp Source: Oral (12/07 1500) BP: 126/83 (12/07 1500) Pulse Rate: 110 (12/07 1500)  Labs: Recent Labs    05/19/19 1026 05/19/19 1446  HGB 13.3  --   HCT 38.3*  --   PLT 304  --   APTT  --  >160*  LABPROT  --  16.2*  INR  --  1.3*  CREATININE 1.78*  --   TROPONINIHS 3,929* 3,682*    Estimated Creatinine Clearance: 40.6 mL/min (A) (by C-G formula based on SCr of 1.78 mg/dL (H)).   Medical History: Past Medical History:  Diagnosis Date  . Hypertension     Assessment: 63 year old male presented with chest pain and confusion. Patient reports non-compliance with his antihypertensives. Confirmed with patient that he is not on anticoagulants PTA. Pharmacy consulted for heparin drip for NSTEMI.  Goal of Therapy:  Heparin level 0.3-0.7 units/ml Monitor platelets by anticoagulation protocol: Yes   Plan:  Patient s/p cath. APTT elevated. Level drawn post cath. Patient received heparin bolus x 2 for total of 7000 units while in procedure.  Per Cardiologist, patient to resume on heparin 8 hours after sheath removal. Per procedure log, sheath removed approximately 1400. Heparin 800 units/hr ordered to start at 2200. HL tomorrow at 0400. CBC daily while on heparin drip.  Per Cardiology note, plan to transition to [place] before discharge.  Tawnya Crook, PharmD 05/19/2019,4:22 PM

## 2019-05-19 NOTE — Progress Notes (Signed)
[facility] for heparin Indication: chest pain/ACS (NSTEMI)  Not on File  Patient Measurements: Height: 5\' 6"  (167.6 cm) Weight: 160 lb (72.6 kg) IBW/kg (Calculated) : 63.8 Heparin Dosing Weight: 72 kg  Vital Signs: BP: 161/109 (12/07 1145) Pulse Rate: 131 (12/07 1145)  Labs: Recent Labs    05/19/19 1026  HGB 13.3  HCT 38.3*  PLT 304  CREATININE 1.78*  TROPONINIHS 3,929*    Estimated Creatinine Clearance: 37.8 mL/min (A) (by C-G formula based on SCr of 1.78 mg/dL (H)).   Medical History: Past Medical History:  Diagnosis Date  . Hypertension     Assessment: 63 year old male presented with chest pain and confusion. Patient reports non-compliance with his antihypertensives. Confirmed with patient that he is not on anticoagulants PTA. Pharmacy consulted for heparin drip for NSTEMI.  Goal of Therapy:  Heparin level 0.3-0.7 units/ml Monitor platelets by anticoagulation protocol: Yes   Plan:  Heparin 4000 unit bolus followed by heparin drip at 800 units/hr. HL at 1900. CBC daily while on heparin drip.  Tawnya Crook, PharmD 05/19/2019,12:22 PM

## 2019-05-19 NOTE — ED Notes (Addendum)
Pt otf for imaging, cardiologist aware

## 2019-05-19 NOTE — ED Notes (Signed)
Dr. Kerman Passey informed of elevated trop

## 2019-05-19 NOTE — Progress Notes (Signed)
Pt HR< 60 ; Dr. Fletcher Anon notified. Order given to d/c cardizem gtt. Will continue to monitor pt closely.

## 2019-05-20 ENCOUNTER — Inpatient Hospital Stay: Payer: Medicaid Other

## 2019-05-20 ENCOUNTER — Inpatient Hospital Stay (HOSPITAL_COMMUNITY)
Admit: 2019-05-20 | Discharge: 2019-05-20 | Disposition: A | Discharge status: Home / Self Care | Payer: Medicaid Other | Attending: Cardiovascular Disease | Admitting: Cardiovascular Disease

## 2019-05-20 DIAGNOSIS — I255 Ischemic cardiomyopathy: Secondary | ICD-10-CM

## 2019-05-20 DIAGNOSIS — E785 Hyperlipidemia, unspecified: Secondary | ICD-10-CM

## 2019-05-20 DIAGNOSIS — I340 Nonrheumatic mitral (valve) insufficiency: Secondary | ICD-10-CM

## 2019-05-20 DIAGNOSIS — I361 Nonrheumatic tricuspid (valve) insufficiency: Secondary | ICD-10-CM

## 2019-05-20 DIAGNOSIS — I2119 ST elevation (STEMI) myocardial infarction involving other coronary artery of inferior wall: Principal | ICD-10-CM

## 2019-05-20 DIAGNOSIS — N289 Disorder of kidney and ureter, unspecified: Secondary | ICD-10-CM

## 2019-05-20 LAB — LIPID PANEL
Cholesterol: 108 mg/dL (ref 0–200)
HDL: 22 mg/dL — ABNORMAL LOW (ref >40)
LDL Cholesterol: 75 mg/dL (ref 0–99)
Total CHOL/HDL Ratio: 4.9 RATIO
Triglycerides: 54 mg/dL (ref <150)
VLDL: 11 mg/dL (ref 0–40)

## 2019-05-20 LAB — HEPATIC FUNCTION PANEL
ALT: 18 U/L (ref 0–44)
AST: 22 U/L (ref 15–41)
Albumin: 2.6 g/dL — ABNORMAL LOW (ref 3.5–5.0)
Alkaline Phosphatase: 67 U/L (ref 38–126)
Bilirubin, Direct: 0.4 mg/dL — ABNORMAL HIGH (ref 0.0–0.2)
Indirect Bilirubin: 0.6 mg/dL (ref 0.3–0.9)
Total Bilirubin: 1 mg/dL (ref 0.3–1.2)
Total Protein: 5.9 g/dL — ABNORMAL LOW (ref 6.5–8.1)

## 2019-05-20 LAB — CBC
HCT: 35.8 % — ABNORMAL LOW (ref 39.0–52.0)
Hemoglobin: 12.2 g/dL — ABNORMAL LOW (ref 13.0–17.0)
MCH: 30 pg (ref 26.0–34.0)
MCHC: 34.1 g/dL (ref 30.0–36.0)
MCV: 88.2 fL (ref 80.0–100.0)
Platelets: 282 10*3/uL (ref 150–400)
RBC: 4.06 MIL/uL — ABNORMAL LOW (ref 4.22–5.81)
RDW: 13.8 % (ref 11.5–15.5)
WBC: 11.2 10*3/uL — ABNORMAL HIGH (ref 4.0–10.5)
nRBC: 0 % (ref 0.0–0.2)

## 2019-05-20 LAB — BASIC METABOLIC PANEL
Anion gap: 9 (ref 5–15)
BUN: 32 mg/dL — ABNORMAL HIGH (ref 8–23)
CO2: 19 mmol/L — ABNORMAL LOW (ref 22–32)
Calcium: 8.2 mg/dL — ABNORMAL LOW (ref 8.9–10.3)
Chloride: 107 mmol/L (ref 98–111)
Creatinine, Ser: 1.95 mg/dL — ABNORMAL HIGH (ref 0.61–1.24)
GFR calc Af Amer: 41 mL/min — ABNORMAL LOW (ref >60)
GFR calc non Af Amer: 35 mL/min — ABNORMAL LOW (ref >60)
Glucose, Bld: 107 mg/dL — ABNORMAL HIGH (ref 70–99)
Potassium: 4.2 mmol/L (ref 3.5–5.1)
Sodium: 135 mmol/L (ref 135–145)

## 2019-05-20 LAB — HEPARIN LEVEL (UNFRACTIONATED)
Heparin Unfractionated: <0.1 IU/mL — ABNORMAL LOW (ref 0.30–0.70)
Heparin Unfractionated: 0.28 IU/mL — ABNORMAL LOW (ref 0.30–0.70)
Heparin Unfractionated: 0.1 IU/mL — ABNORMAL LOW (ref 0.30–0.70)

## 2019-05-20 LAB — ECHOCARDIOGRAM COMPLETE
Height: 68 in
Weight: 2582.03 oz

## 2019-05-20 MED ORDER — ENSURE ENLIVE PO LIQD
237.0000 mL | Freq: Two times a day (BID) | ORAL | Status: DC
  Administered 2019-05-21 – 2019-06-03 (×27): 237 mL via ORAL

## 2019-05-20 MED ORDER — ATORVASTATIN CALCIUM 20 MG PO TABS
40.0000 mg | ORAL_TABLET | Freq: Every day | ORAL | Status: DC
  Administered 2019-05-20 – 2019-06-03 (×15): 40 mg via ORAL
  Filled 2019-05-20 (×15): qty 2

## 2019-05-20 MED ORDER — HEART ATTACK BOUNCING BOOK
Freq: Once | Status: AC
  Administered 2019-05-20: 12:00:00

## 2019-05-20 MED ORDER — CARVEDILOL 12.5 MG PO TABS
12.5000 mg | ORAL_TABLET | Freq: Two times a day (BID) | ORAL | Status: DC
  Administered 2019-05-20 – 2019-05-23 (×6): 12.5 mg via ORAL
  Filled 2019-05-20 (×6): qty 1

## 2019-05-20 NOTE — Plan of Care (Signed)
Nutrition Education Note  RD consulted for nutrition education regarding a Heart Healthy diet.   63 y.o. male with history of hypertension and tobacco use, admitted with late-presenting inferior STEMI, status post unsuccessful PCI to the RCA complicated by ischemic cardiomyopathy and renal insufficiency of uncertain chronicity.  Met with pt in room today. Pt reports good appetite and oral intake at baseline. Pt reports eating 50% of his breakfast and lunch today but reports that he does not feel hungry enough to eat supper. Pt would like to have Ensure supplements between meals; RD will order these to help pt meet his estimated needs until his appetite improves.   Lipid Panel     Component Value Date/Time   CHOL 108 05/20/2019 0408   TRIG 54 05/20/2019 0408   HDL 22 (L) 05/20/2019 0408   CHOLHDL 4.9 05/20/2019 0408   VLDL 11 05/20/2019 0408   LDLCALC 75 05/20/2019 0408    RD provided "Heart Healthy Nutrition Therapy" handout from the [employer]. Reviewed patient's dietary recall. Provided examples on ways to decrease sodium and fat intake in diet. Discouraged intake of processed foods and use of salt shaker. Encouraged fresh fruits and vegetables as well as whole grain sources of carbohydrates to maximize fiber intake. Teach back method used.  Expect fair compliance.  Body mass index is 24.54 kg/m. Pt meets criteria for normal weight based on current BMI.  RD will add Ensure Enlive po BID, each supplement provides 350 kcal and 20 grams of protein  No further nutrition interventions warranted at this time. RD contact information provided. If additional nutrition issues arise, please re-consult RD.  Koleen Distance MS, RD, LDN Pager #- [phone] Office#- [phone] After Hours Pager: [phone]

## 2019-05-20 NOTE — Progress Notes (Addendum)
Progress Note  Patient Name: Gordon [name] Date of Encounter: 05/20/2019  Primary Cardiologist: New - [facility]  Subjective   Feeling better this morning.  Notes some palpitations but denies chest pain, shortness of breath, orthopnea, and edema.  Inpatient Medications    Scheduled Meds: . aspirin  81 mg Oral Daily  . atorvastatin  80 mg Oral q1800  . Chlorhexidine Gluconate Cloth  6 each Topical Daily  . clopidogrel  75 mg Oral Daily  . influenza vac split quadrivalent PF  0.5 mL Intramuscular Tomorrow-1000  . metoprolol tartrate  25 mg Oral Q6H  . sodium chloride flush  3 mL Intravenous Q12H   Continuous Infusions: . sodium chloride    . heparin 800 Units/hr (05/19/19 2221)   PRN Meds: sodium chloride, acetaminophen, ondansetron (ZOFRAN) IV, sodium chloride flush   Vital Signs    Vitals:   05/20/19 0503 05/20/19 0600 05/20/19 0700 05/20/19 0800  BP: (!) 116/100 118/89 (!) 140/102   Pulse: 66 63 (!) 30   Resp:  (!) 21 19   Temp:    (!) 97.5 F (36.4 C)  TempSrc:    Oral  SpO2:  95% 99%   Weight:      Height:        Intake/Output Summary (Last 24 hours) at 05/20/2019 0836 Last data filed at 05/20/2019 0600 Gross per 24 hour  Intake 235.82 ml  Output 750 ml  Net -514.18 ml   Last 3 Weights 05/19/2019 05/19/2019  Weight (lbs) 161 lb 6 oz 160 lb  Weight (kg) 73.2 kg 72.576 kg      Telemetry    NSR with frequent PAC's and occasional PVC's - Personally Reviewed  ECG    No new tracing  Physical Exam   GEN: No acute distress.   Neck: No JVD Cardiac: RRR, no murmurs, rubs, or gallops.  Respiratory: Moderately diminished breath sounds throughout with scatter wheezes.  No crackles. GI: Soft, nontender, non-distended  MS: No edema; No deformity.  Right radial cath site covered with clean dressing. No hematoma. Neuro:  Nonfocal  Psych: Normal affect   Labs    High Sensitivity Troponin:   Recent Labs  Lab 05/19/19 1026 05/19/19 1446 05/19/19 1720   TROPONINIHS 3,929* 3,682* 3,729*      Chemistry Recent Labs  Lab 05/19/19 1026 05/20/19 0408  NA 135 135  K 3.2* 4.2  CL 104 107  CO2 16* 19*  GLUCOSE 155* 107*  BUN 32* 32*  CREATININE 1.78* 1.95*  CALCIUM 8.5* 8.2*  PROT 6.5 5.9*  ALBUMIN 2.9* 2.6*  AST 21 22  ALT 16 18  ALKPHOS 76 67  BILITOT 1.0 1.0  GFRNONAA 39* 35*  GFRAA 46* 41*  ANIONGAP 15 9     Hematology Recent Labs  Lab 05/19/19 1026 05/20/19 0408  WBC 11.7* 11.2*  RBC 4.41 4.06*  HGB 13.3 12.2*  HCT 38.3* 35.8*  MCV 86.8 88.2  MCH 30.2 30.0  MCHC 34.7 34.1  RDW 13.5 13.8  PLT 304 282    BNPNo results for input(s): BNP, PROBNP in the last 168 hours.   DDimer No results for input(s): DDIMER in the last 168 hours.   Radiology    Ct Head Wo Contrast  Result Date: 05/19/2019 CLINICAL DATA:  Encephalopathy EXAM: CT HEAD WITHOUT CONTRAST TECHNIQUE: Contiguous axial images were obtained from the base of the skull through the vertex without intravenous contrast. COMPARISON:  None FINDINGS: Brain: There is no acute intracranial hemorrhage, mass-effect, or edema.  Gray-white differentiation is preserved. There is no extra-axial fluid collection. Patchy and confluent hypoattenuation in the supratentorial white matter is nonspecific but may reflect moderate chronic microvascular ischemic changes. Small age-indeterminate infarct of the lateral right cerebellum. Ventricles and sulci are within normal limits in size and configuration. Vascular: There is atherosclerotic calcification at the skull base. Skull: Calvarium is unremarkable. Sinuses/Orbits: Circumferential left sphenoid sinus mucosal thickening with occluded outflow and mild mucoperiosteal thickening. Orbits are unremarkable. Other: Mastoid air cells are clear. IMPRESSION: No acute intracranial hemorrhage, mass effect, or evidence of acute infarction. Moderate chronic microvascular ischemic changes. Small age-indeterminate infarct of right cerebellum. 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 Electronically Signed   By: Macy Mis M.D.   On: 05/19/2019 12:45   Dg Chest Portable 1 View  Result Date: 05/19/2019 CLINICAL DATA:  Altered mental status EXAM: PORTABLE CHEST 1 VIEW COMPARISON:  None. FINDINGS: Mild atelectasis at the left lung base. No pleural effusion or pneumothorax. Heart size is normal. IMPRESSION: Mild atelectasis at the left lung base. Electronically Signed   By: Macy Mis M.D.   On: 05/19/2019 11:02    Cardiac Studies   LHC (127/12/2018): 1.  Codominant coronary arteries with severe one-vessel coronary artery disease.  The culprit is an occluded proximal right coronary artery which is diffusely diseased and heavily calcified.  Left to right collaterals were noted. 2.  Moderately to severely reduced LV systolic function with an EF of 30 to 35% with akinesis of the inferior wall. 3.  Mildly elevated left ventricular Chun Sellen-diastolic pressure at 21 mmHg. 4.  Unsuccessful balloon angioplasty of the right coronary artery given diffuse disease, calcifications and thrombus.  This was in spite of multiple balloon inflations throughout the whole vessel.  Patient Profile     63 y.o. male with history of hypertension and tobacco use, admitted with late-presenting inferior STEMI, status post unsuccessful PCI to the RCA complicated by ischemic cardiomyopathy and renal insufficiency of uncertain chronicity.  Assessment & Plan    Inferior STEMI: Late presenting (CP for at least 3 days prior to presentation).  PCI to the RCA was unsuccessful due to diffuse disease and heavy calcification.  Chest pain has since resolved.  Telemetry shows persistent ST elevation; post-cath EKG yet to be obtained.  HS-TnI flat, consistent with late-presenting MI.  Complete 48 hours of IV heparin.  DAPT with ASA and clopidogrel for at least 12 months.  Obtain EKG.  Follow-up TTE.  Continue atorvastatin 80 mg daily.  Advised smoking cessation.  Acute  systolic heart failure due to ischemic cardiomyopathy: LVEF 30-35% by LVgram with mildly elevated LVEDP.  Pt appears grossly euvolemic on exam.  Follow-up echo.  Maintain net even fluid status in the setting of renal insufficiency.  Switch metoprolol to carvedilol 12.5 mg BID for improved BP control and optimization of evidence-based HF therapy.  Defer addition of ACEI/ARB/ARNI/AA in the setting of renal insufficiency.  Consider addition of BiDil if BP remains elevated.  Ok to transfer to telemetry today.  Atrial fibrillation: Patient converted to sinus rhythm yesterday shortly after cath.  Continue heparin gtt for now.  Given short duration of a-fib in the setting AMI, will need to reassess risks and benefits of long-term anticoagulation with NOAC prior to discharge.  Renal insufficiency: Uncertain chronicity, with slight rise in creatinine overnight.  UA yesterday unrevealing.  Maintain net even fluid balance.  Avoid nephrotoxic drugs.  Check renal ultrasound to exclude obstructive uropathy.  Hypertension: BP trending down.  Transition metoprolol to carvedilol.  Consider addition of hydralazine/long-acting nitrate for BP control and HF therapy, if needed.  Hyperlipidemia: LDL reasonable at 75.  Deescalate atorvastatin to 40 mg daily to lower risk for side effects.  For questions or updates, please contact [place] Please consult [email or web address] for contact info under [facility] Cardiology.     Signed, Nelva Bush, MD  05/20/2019, 8:36 AM

## 2019-05-20 NOTE — Progress Notes (Signed)
ANTICOAGULATION CONSULT NOTE  Pharmacy Consult for heparin Indication: chest pain/ACS (NSTEMI)  Patient Measurements: Height: 5\' 8"  (172.7 cm) Weight: 161 lb 6 oz (73.2 kg) IBW/kg (Calculated) : 68.4 Heparin Dosing Weight: 72 kg  Vital Signs: Temp: 97.5 F (36.4 C) (12/08 0800) Temp Source: Oral (12/08 0800) BP: 131/79 (12/08 1300) Pulse Rate: 64 (12/08 1300)  Labs: Recent Labs    05/19/19 1026 05/19/19 1446 05/19/19 1720 05/20/19 0408 05/20/19 1405  HGB 13.3  --   --  12.2*  --   HCT 38.3*  --   --  35.8*  --   PLT 304  --   --  282  --   APTT  --  >160*  --   --   --   LABPROT  --  16.2*  --   --   --   INR  --  1.3*  --   --   --   HEPARINUNFRC  --   --   --  <0.10* 0.28*  CREATININE 1.78*  --   --  1.95*  --   TROPONINIHS 3,929* 3,682* 3,729*  --   --     Estimated Creatinine Clearance: 37 mL/min (A) (by C-G formula based on SCr of 1.95 mg/dL (H)).   Medical History: Past Medical History:  Diagnosis Date  . Hypertension     Assessment: 63 year old male presented with chest pain and confusion. Patient reports non-compliance with his antihypertensives. Confirmed with patient that he is not on anticoagulants PTA. Pharmacy consulted for heparin drip for NSTEMI. H&H trending down, PLT wnl  Heparin Course 12/7 initiation: 4000 unit bolus, then 800 units/hr 12/8 0408 HL<0.10: inc to 1000 units/hr 12/8 1405 HL 0.28: inc to 1100 units/hr  Goal of Therapy:  Heparin level 0.3-0.7 units/ml Monitor platelets by anticoagulation protocol: Yes   Plan:   Heparin level slightly subtherapeutic: increase rate to 1100 units/hr  Re-check heparin level 6 hours after rate change  CBC in am  Per Cardiology note, plan to transition to [place] before discharge.  Dallie Piles, PharmD 05/20/2019,2:41 PM

## 2019-05-20 NOTE — Progress Notes (Signed)
ANTICOAGULATION CONSULT NOTE  Pharmacy Consult for heparin Indication: chest pain/ACS (NSTEMI)  Not on File  Patient Measurements: Height: 5\' 8"  (172.7 cm) Weight: 161 lb 6 oz (73.2 kg) IBW/kg (Calculated) : 68.4 Heparin Dosing Weight: 72 kg  Vital Signs: Temp: 98.3 F (36.8 C) (12/08 0400) Temp Source: Oral (12/08 0400) BP: 116/100 (12/08 0503) Pulse Rate: 66 (12/08 0503)  Labs: Recent Labs    05/19/19 1026 05/19/19 1446 05/19/19 1720 05/20/19 0408  HGB 13.3  --   --  12.2*  HCT 38.3*  --   --  35.8*  PLT 304  --   --  282  APTT  --  >160*  --   --   LABPROT  --  16.2*  --   --   INR  --  1.3*  --   --   HEPARINUNFRC  --   --   --  <0.10*  CREATININE 1.78*  --   --  1.95*  TROPONINIHS 3,929* 3,682* 3,729*  --     Estimated Creatinine Clearance: 37 mL/min (A) (by C-G formula based on SCr of 1.95 mg/dL (H)).   Medical History: Past Medical History:  Diagnosis Date  . Hypertension     Assessment: 63 year old male presented with chest pain and confusion. Patient reports non-compliance with his antihypertensives. Confirmed with patient that he is not on anticoagulants PTA. Pharmacy consulted for heparin drip for NSTEMI.  Goal of Therapy:  Heparin level 0.3-0.7 units/ml Monitor platelets by anticoagulation protocol: Yes   Plan:  Patient s/p cath. APTT elevated. Level drawn post cath. Patient received heparin bolus x 2 for total of 7000 units while in procedure.  Per Cardiologist, patient to resume on heparin 8 hours after sheath removal. Per procedure log, sheath removed approximately 1400. Heparin 800 units/hr ordered to start at 2200. HL tomorrow at 0400. CBC daily while on heparin drip.  12/8 @ 0408 HL < 0.10.  Will increase heparin infusion to 1000 units/hr and recheck in 8 hours  Per Cardiology note, plan to transition to [place] before discharge.  Ena Dawley, PharmD 05/20/2019,5:52 AM

## 2019-05-20 NOTE — Progress Notes (Signed)
*  PRELIMINARY RESULTS* Echocardiogram 2D Echocardiogram has been performed.  Gordon [name] 05/20/2019, 11:17 AM

## 2019-05-21 ENCOUNTER — Inpatient Hospital Stay: Payer: Medicaid Other

## 2019-05-21 DIAGNOSIS — I5020 Unspecified systolic (congestive) heart failure: Secondary | ICD-10-CM

## 2019-05-21 DIAGNOSIS — I4891 Unspecified atrial fibrillation: Secondary | ICD-10-CM

## 2019-05-21 LAB — BASIC METABOLIC PANEL
Anion gap: 11 (ref 5–15)
BUN: 30 mg/dL — ABNORMAL HIGH (ref 8–23)
CO2: 18 mmol/L — ABNORMAL LOW (ref 22–32)
Calcium: 8.1 mg/dL — ABNORMAL LOW (ref 8.9–10.3)
Chloride: 108 mmol/L (ref 98–111)
Creatinine, Ser: 2.01 mg/dL — ABNORMAL HIGH (ref 0.61–1.24)
GFR calc Af Amer: 39 mL/min — ABNORMAL LOW (ref >60)
GFR calc non Af Amer: 34 mL/min — ABNORMAL LOW (ref >60)
Glucose, Bld: 89 mg/dL (ref 70–99)
Potassium: 3.8 mmol/L (ref 3.5–5.1)
Sodium: 137 mmol/L (ref 135–145)

## 2019-05-21 LAB — CBC
HCT: 36.4 % — ABNORMAL LOW (ref 39.0–52.0)
Hemoglobin: 12 g/dL — ABNORMAL LOW (ref 13.0–17.0)
MCH: 30.2 pg (ref 26.0–34.0)
MCHC: 33 g/dL (ref 30.0–36.0)
MCV: 91.5 fL (ref 80.0–100.0)
Platelets: 278 10*3/uL (ref 150–400)
RBC: 3.98 MIL/uL — ABNORMAL LOW (ref 4.22–5.81)
RDW: 14.1 % (ref 11.5–15.5)
WBC: 10.2 10*3/uL (ref 4.0–10.5)
nRBC: 0 % (ref 0.0–0.2)

## 2019-05-21 LAB — HEPARIN LEVEL (UNFRACTIONATED)
Heparin Unfractionated: 0.39 IU/mL (ref 0.30–0.70)
Heparin Unfractionated: <0.1 IU/mL — ABNORMAL LOW (ref 0.30–0.70)

## 2019-05-21 MED ORDER — ISOSORB DINITRATE-HYDRALAZINE 20-37.5 MG PO TABS
1.0000 | ORAL_TABLET | Freq: Three times a day (TID) | ORAL | Status: DC
  Administered 2019-05-21 – 2019-05-23 (×5): 1 via ORAL
  Filled 2019-05-21 (×10): qty 1

## 2019-05-21 NOTE — Progress Notes (Signed)
ANTICOAGULATION CONSULT NOTE  Pharmacy Consult for heparin Indication: chest pain/ACS (NSTEMI)  Patient Measurements: Height: 5\' 8"  (172.7 cm) Weight: 163 lb 4.8 oz (74.1 kg) IBW/kg (Calculated) : 68.4 Heparin Dosing Weight: 72 kg  Vital Signs: Temp: 97.9 F (36.6 C) (12/09 0731) Temp Source: Oral (12/09 0731) BP: 128/83 (12/09 0731) Pulse Rate: 70 (12/09 0731)  Labs: Recent Labs    05/19/19 1026 05/19/19 1446 05/19/19 1720  05/20/19 0408 05/20/19 1405 05/20/19 2242 05/21/19 0425 05/21/19 0627  HGB 13.3  --   --   --  12.2*  --   --  12.0*  --   HCT 38.3*  --   --   --  35.8*  --   --  36.4*  --   PLT 304  --   --   --  282  --   --  278  --   APTT  --  >160*  --   --   --   --   --   --   --   LABPROT  --  16.2*  --   --   --   --   --   --   --   INR  --  1.3*  --   --   --   --   --   --   --   HEPARINUNFRC  --   --   --    < > <0.10* 0.28* 0.10*  --  0.39  CREATININE 1.78*  --   --   --  1.95*  --   --   --   --   TROPONINIHS 3,929* 3,682* 3,729*  --   --   --   --   --   --    < > = values in this interval not displayed.    Estimated Creatinine Clearance: 37 mL/min (A) (by C-G formula based on SCr of 1.95 mg/dL (H)).   Medical History: Past Medical History:  Diagnosis Date  . Hypertension     Assessment: 63 year old male presented with chest pain and confusion. Patient reports non-compliance with his antihypertensives. Confirmed with patient that he is not on anticoagulants PTA. Pharmacy consulted for heparin drip for NSTEMI. H&H trending down, PLT wnl  Heparin Course 12/7 initiation: 4000 unit bolus, then 800 units/hr 12/8 0408 HL<0.10: inc to 1000 units/hr 12/8 1405 HL 0.28: inc to 1100 units/hr 12/8 2242 HL 0.10: inc to 1300 units/hr 12/9 0627 HL 0.39: continue with current rate.   Goal of Therapy:  Heparin level 0.3-0.7 units/ml Monitor platelets by anticoagulation protocol: Yes   Plan:   Heparin level therapeutic: continue rate at 1300  units/hr  Re-check heparin level 6 hours, if therapeutic switch to daily levels.   Daily CBC  Per Cardiology note, plan to transition to [place] before discharge, as pt has a diagnosis of afib.  Oswald Hillock, PharmD, BCPS 05/21/2019,7:45 AM

## 2019-05-21 NOTE — Evaluation (Signed)
Physical Therapy Evaluation Patient Details Name: Gordon [name] MRN: [id number] DOB: [birth year] Today's Date: 05/21/2019   History of Present Illness  Gordon [name] is a 33yoM who comes to [facility] 12/7 c CP and gen weakness, notable HTN and AFcRVR, pt admitted c STEMI, HeadCT negaitve. Pt underwent a cardiac catheterization on 12/7 status post unsuccessful balloon angioplasty of the RCA due to diffuse disease, calcification and thrombus. Pt having expressive difficulty and motor control impairment of the right hemibody. Head CT/MRI showing acute CVA. PTA pt was fully independent, retired but looking ino to working PT in the future.  Clinical Impression           Extremity/Trunk Assessment            Pt admitted with above diagnosis. Pt currently with functional limitations due to the deficits listed below (see "PT Problem List"). Upon entry, pt in bed, awake and agreeable to participate. The pt is alert and oriented x4 and generally a good historian, however his acute expressive aphasia creates communicative difficulty at times and pt is labile rapidly moving from pleasant to quite frustrated when presented with language difficulty. Sister is at bedside who helps with details in information. Pt able to mobilize to EOB with minA, rise to standing with minA, and AMB ~62ft with MinA, but has fatigue limitations. Examination is revealing for significant Rt hemi weakness (mildly), gross and fine motor apraxia and ataxia, mild cognitive difficulty with following commands for strength testing, and Right sensory impairment pt unable to localize any tactile stimulus in RUE/RLE. Pt also has difficulty with R/L discrimination at times, as well as Rt upper/lower limb discrimination at times. Functional mobility assessment demonstrates increased effort/time requirements, fair tolerance, and absolute need for physical assistance, whereas the patient performed these at a higher level of independence PTA. Pt will  benefit from skilled PT intervention to increase independence and safety with basic mobility in preparation for discharge to the venue listed below.       Upper Extremity Assessment Upper Extremity Assessment: RUE deficits/detail RUE Sensation: decreased proprioception;decreased light touch(tested sensory localization c eyes cosed. Pt unable to sense any lght touch on the RUE/RLE.) RUE Coordination: decreased gross motor;decreased fine motor(decreased segmental differentiation of the entire limb; delayed inititation with eventual large amplitude explosive movements; Pt sometimes has difficulty differentiating RUE from RLE or differentiaitng RUE from LUE.)  Lower Extremity Assessment Lower Extremity Assessment: RLE deficits/detail RLE Sensation: decreased proprioception;decreased light touch RLE Coordination: decreased gross motor;decreased fine motor(decreased segmental differentiation of the entire limb; delayed inititation with eventual large amplitude explosive movements; Pt sometimes has difficulty differentiating RUE from RLE or differentiaitng RUE from LUE.)  Cervical / Trunk Assessment Cervical / Trunk Assessment: (unable to follow cues for trunk leaning; gave cues for lateral lean and reach with resultant LUE/LLE synchronous movement.)    Follow Up Recommendations CIR;Supervision for mobility/OOB;Supervision - Intermittent    Equipment Recommendations  None recommended by PT(to be determined;)    Recommendations for Other Services       Precautions / Restrictions Precautions Precautions: Fall Restrictions Weight Bearing Restrictions: No      Mobility  Bed Mobility Overal bed mobility: Modified Independent             General bed mobility comments: loss of smooth, controlled movements, but generally able to move to EOB without mch assist, although Right heel get stuck in bed frame catherter hook and pt unable to problem solve this issue or identify the  problem.  Transfers Overall transfer  level: Needs assistance Equipment used: 1 person hand held assist Transfers: Sit to/from Stand Sit to Stand: Min assist         General transfer comment: rises to standing with fairly low to moderate effort, no impulsivity, but holds onto PT for balance throughout.  Ambulation/Gait Ambulation/Gait assistance: Min assist Gait Distance (Feet): 80 Feet Assistive device: 1 person hand held assist Gait Pattern/deviations: Step-to pattern     General Gait Details: genrally good Rt foot clearnance/heel stike, but does scuff Right foot twice during gait; instinctive imulsive use of RUE for self stabilization which is thwarted by coordination issues. Pt fatigued after 40 ft, asks to turn back to room.  Stairs            Wheelchair Mobility    Modified Rankin (Stroke Patients Only)       Balance Overall balance assessment: Modified Independent;Needs assistance Sitting-balance support: Feet supported Sitting balance-Leahy Scale: Fair Sitting balance - Comments: truncal apraxia, but sits upright and balanced without apparent trunk weakness. Unable to lean when cued. Able to touch ankles with BUE individually without LOB.   Standing balance support: Single extremity supported;During functional activity Standing balance-Leahy Scale: Fair Standing balance comment: surprisingly stable, but reqires absolute minA for avoiding LOB.                             Pertinent Vitals/Pain Pain Assessment: No/denies pain    Home Living Family/patient expects to be discharged to:: Private residence Living Arrangements: Alone   Type of Home: Mobile home Home Access: Level entry     Home Layout: One level Home Equipment: None      Prior Function Level of Independence: Independent         Comments: no history of falls or balance issues;     Hand Dominance   Dominant Hand: Right(sister thinks he is Left handed)     Extremity/Trunk Assessment   Upper Extremity Assessment Upper Extremity Assessment: RUE deficits/detail RUE Sensation: decreased proprioception;decreased light touch(tested sensory localization c eyes cosed. Pt unable to sense any lght touch on the RUE/RLE.) RUE Coordination: decreased gross motor;decreased fine motor(decreased segmental differentiation of the entire limb; delayed inititation with eventual large amplitude explosive movements; Pt sometimes has difficulty differentiating RUE from RLE or differentiaitng RUE from LUE.)    Lower Extremity Assessment Lower Extremity Assessment: RLE deficits/detail RLE Sensation: decreased proprioception;decreased light touch RLE Coordination: decreased gross motor;decreased fine motor(decreased segmental differentiation of the entire limb; delayed inititation with eventual large amplitude explosive movements; Pt sometimes has difficulty differentiating RUE from RLE or differentiaitng RUE from LUE.)    Cervical / Trunk Assessment Cervical / Trunk Assessment: (unable to follow cues for trunk leaning; gave cues for lateral lean and reach with resultant LUE/LLE synchronous movement.)  Communication   Communication: Expressive difficulties  Cognition Arousal/Alertness: Awake/alert Behavior During Therapy: WFL for tasks assessed/performed Overall Cognitive Status: Within Functional Limits for tasks assessed                                        General Comments      Exercises     Assessment/Plan    PT Assessment Patient needs continued PT services  PT Problem List Decreased strength;Decreased range of motion;Decreased activity tolerance;Decreased balance;Decreased mobility;Decreased coordination;Decreased safety awareness;Decreased knowledge of precautions       PT Treatment Interventions DME  instruction;Balance training;Gait training;Cognitive remediation;Neuromuscular re-education;Functional mobility training;Therapeutic  activities;Therapeutic exercise;Patient/family education    PT Goals (Current goals can be found in the Care Plan section)  Acute Rehab PT Goals Patient Stated Goal: return to baseline for basic mobility. PT Goal Formulation: With patient Time For Goal Achievement: 06/04/19 Potential to Achieve Goals: Fair    Frequency 7X/week   Barriers to discharge Inaccessible home environment      Co-evaluation               AM-PAC PT "6 Clicks" Mobility  Outcome Measure Help needed turning from your back to your side while in a flat bed without using bedrails?: A Little Help needed moving from lying on your back to sitting on the side of a flat bed without using bedrails?: A Little Help needed moving to and from a bed to a chair (including a wheelchair)?: A Little Help needed standing up from a chair using your arms (e.g., wheelchair or bedside chair)?: A Little Help needed to walk in hospital room?: A Little Help needed climbing 3-5 steps with a railing? : A Lot 6 Click Score: 17    End of Session Equipment Utilized During Treatment: Gait belt Activity Tolerance: Patient tolerated treatment well;Patient limited by fatigue Patient left: in chair;with family/visitor present;with call bell/phone within reach;with chair alarm set(sister at bedside, educated on not getting up on own, RN aware to place alarm once ready.) Nurse Communication: Mobility status PT Visit Diagnosis: Unsteadiness on feet (R26.81);Difficulty in walking, not elsewhere classified (R26.2);Apraxia (R48.2);Ataxic gait (R26.0);Other abnormalities of gait and mobility (R26.89)    Time: 1455-1530 PT Time Calculation (min) (ACUTE ONLY): 35 min   Charges:   PT Evaluation $PT Eval Moderate Complexity: 1 Mod PT Treatments $Neuromuscular Re-education: 8-22 mins        4:06 PM, 05/21/19 Etta Grandchild, PT, DPT Physical Therapist - [facility]  [phone] ([place])   Nyella Eckels  C 05/21/2019, 3:57 PM

## 2019-05-21 NOTE — Consult Note (Signed)
[facility] Medical Consultation  Gordon [name] [id number] DOB: [birth year] DOA: 05/19/2019 PCP: System, Pcp Not In   Requesting physician: Dr. Jaynie Bream Date of consultation: 05/21/2019 Reason for consultation: word finding difficulties  HPI:  63 year old male without known past medical history, on no medications as an outpatient and not seeing doctors regularly, who was admitted to the hospital on 05/23/2019 on cardiology service with substernal chest pain.  He was found to be in A. fib with RVR and there was concern for STEMI.  On admission he also reportedly had intermittent confusion.  He underwent a cardiac catheterization on 12/7 status post unsuccessful balloon angioplasty of the RCA due to diffuse disease, calcification and thrombus.  He was also found to have moderately to severely reduced LV systolic function with an EF of 30-35% with inferior wall akinesis.  I was called in for consult today by primary cardiology team due to the fact that since yesterday the nurses reported that the patient is experiencing word finding difficulties.  On my evaluation he appears comfortable and in no acute distress, and tells me that he has been having difficulties finding words and organizing his speech following the cardiac catheterization.  He denies any headaches, denies any lightheadedness or dizziness.  He denies any significant weakness but later admits to feeling more weak on the right side of his body including his right arm and right leg.  He denies any numbness or tingling.  Currently has no chest pain, denies any fever or chills.  Denies any palpitations.  Review of Systems:  As per HPI, otherwise 10 point review of systems negative   Impression/Recommendations Active Problems:   ST elevation myocardial infarction (STEMI) of inferior wall (HCC)   Word finding difficulties /acute encephalopathy /concern for CVA -Concern for CVA, he has been having persistent symptoms since  yesterday.  Will obtain an MRI of the brain, and if positive will need neurology consultation and completion of his stroke work-up -There are reports of patient having intermittent confusion since admission for which he underwent a CT scan prior to the cath which showed chronic microvascular ischemic changes and small age-indeterminate infarct of the right cerebellum -He has risk factors for CVA given A. fib, possibly post cath as well -Part of the stroke work-up already underwent a 2D echo which showed an EF of [id number], grade 2 diastolic dysfunction, underwent a lipid panel which showed an LDL of 75 and he was already started on statin.  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  Await MRI of the brain to make decisions about when could he be transitioned to a NOAC  Hyperlipidemia -Patient has been started on atorvastatin  Essential hypertension -If MRI positive for stroke will need to allow permissive hypertension and gradually normalize blood pressure in 5 to 7 days  Tobacco use -Counseled for cessation  Chronic kidney disease, unable to determined stage -Creatinine 1.78 on admission, 2.0 this morning, overall stable.  Renal ultrasound done yesterday showed increased cortical echogenicity consistent with medical renal disease -Monitor post cath contrast exposure   The hospitalist team will followup again tomorrow. Please contact me if I can be of assistance in the meanwhile. Thank you  for this consultation.   Past Medical History:  Diagnosis Date   Hypertension    Past Surgical History:  Procedure Laterality Date     CORONARY BALLOON ANGIOPLASTY N/A 05/19/2019   Procedure: CORONARY BALLOON ANGIOPLASTY;  Surgeon: Wellington Hampshire, MD;  Location: [place] [facility];  Service: Cardiovascular;  Laterality: N/A;   CORONARY/GRAFT ACUTE MI REVASCULARIZATION N/A 05/19/2019   Procedure: Coronary/Graft Acute MI Revascularization;  Surgeon: Wellington Hampshire, MD;  Location: [place] [facility];  Service: Cardiovascular;  Laterality: N/A;   LEFT HEART CATH AND CORONARY ANGIOGRAPHY N/A 05/19/2019   Procedure: LEFT HEART CATH AND CORONARY ANGIOGRAPHY;  Surgeon: Wellington Hampshire, MD;  Location: [place] [facility];  Service: Cardiovascular;  Laterality: N/A;   Social History:  reports that he has been smoking cigarettes. He has been smoking about 0.50 packs per day. He uses smokeless tobacco. No history on file for alcohol and drug.  History reviewed. No pertinent family history.  Prior to Admission medications   Not on File   Physical Exam: Blood pressure 128/83, pulse 70, temperature 97.9 F (36.6 C), temperature source Oral, resp. rate 18, height 5\' 8"  (1.727 m), weight 74.1 kg, SpO2 95 %. Vitals:   05/21/19 0410 05/21/19 0731  BP: 129/80 128/83  Pulse: 72 70  Resp: 17 18  Temp: 97.7 F (36.5 C) 97.9 F (36.6 C)  SpO2: 98% 95%     General: No apparent distress, appears comfortable  Eyes: No scleral icterus  ENT: NCAT  Neck: Supple  Cardiovascular: Regular rate and rhythm, no significant murmurs identified.  No peripheral edema  Respiratory: Clear bilaterally, slightly diminished at the bases but no wheezing or rhonchi  Abdomen: Soft, nontender, nondistended, bowel sounds positive  Skin: No rashes seen  Musculoskeletal: Normal muscle tone  Psychiatric: Alert and oriented x3  Neurologic: Cranial nerves grossly intact, 5-/5 right upper and right lower extremity, 5/5 on left.  Expressive aphasia noted  Labs on Admission:  Basic Metabolic Panel: Recent Labs  Lab 05/19/19 1026  05/20/19 0408 05/21/19 0427  NA 135 135 137  K 3.2* 4.2 3.8  CL 104 107 108  CO2 16* 19* 18*  GLUCOSE 155* 107* 89  BUN 32* 32* 30*  CREATININE 1.78* 1.95* 2.01*  CALCIUM 8.5* 8.2* 8.1*   Liver Function Tests: Recent Labs  Lab 05/19/19 1026 05/20/19 0408  AST 21 22  ALT 16 18  ALKPHOS 76 67  BILITOT 1.0 1.0  PROT 6.5 5.9*  ALBUMIN 2.9* 2.6*   No results for input(s): LIPASE, AMYLASE in the last 168 hours. No results for input(s): AMMONIA in the last 168 hours. CBC: Recent Labs  Lab 05/19/19 1026 05/20/19 0408 05/21/19 0425  WBC 11.7* 11.2* 10.2  HGB 13.3 12.2* 12.0*  HCT 38.3* 35.8* 36.4*  MCV 86.8 88.2 91.5  PLT 304 282 278   Cardiac Enzymes: No results for input(s): CKTOTAL, CKMB, CKMBINDEX, TROPONINI in the last 168 hours. BNP: Invalid input(s): POCBNP CBG: Recent Labs  Lab 05/19/19 1443  GLUCAP 100*    Radiological Exams on Admission: Ct Head Wo Contrast  Result Date: 05/19/2019 CLINICAL DATA:  Encephalopathy EXAM: CT HEAD WITHOUT CONTRAST TECHNIQUE: Contiguous axial images were obtained from the base of the skull through the vertex without intravenous contrast. COMPARISON:  None FINDINGS: Brain: There is no acute intracranial hemorrhage, mass-effect, or edema. Gray-white differentiation is preserved. There is no extra-axial fluid collection. Patchy and confluent hypoattenuation in the supratentorial white matter is nonspecific but may reflect  moderate chronic microvascular ischemic changes. Small age-indeterminate infarct of the lateral right cerebellum. Ventricles and sulci are within normal limits in size and configuration. Vascular: There is atherosclerotic calcification at the skull base. Skull: Calvarium is unremarkable. Sinuses/Orbits: Circumferential left sphenoid sinus mucosal thickening with occluded outflow and mild mucoperiosteal thickening. Orbits are unremarkable. Other: Mastoid air cells are clear. IMPRESSION: No acute intracranial hemorrhage,  mass effect, or evidence of acute infarction. Moderate chronic microvascular ischemic changes. Small age-indeterminate infarct of right cerebellum. 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 Electronically Signed   By: Macy Mis M.D.   On: 05/19/2019 12:45   US Renal  Result Date: 05/20/2019 CLINICAL DATA:  Renal insufficiency EXAM: RENAL / URINARY TRACT ULTRASOUND COMPLETE COMPARISON:  None. FINDINGS: Right Kidney: Renal measurements: 9.3 x 4.9 x 4.9 cm = volume: 104.3 mL. Increased cortical echogenicity. Left Kidney: Renal measurements: 9.6 x 6.1 x 5.5 cm = volume: 167.0 mL. Increased cortical echogenicity. Bladder: Appears normal for degree of bladder distention. Other: None. IMPRESSION: Increased cortical echogenicity bilaterally consistent with medical renal disease. No acute abnormalities. Electronically Signed   By: Dorise Bullion III M.D   On: 05/20/2019 10:53   Dg Chest Portable 1 View  Result Date: 05/19/2019 CLINICAL DATA:  Altered mental status EXAM: PORTABLE CHEST 1 VIEW COMPARISON:  None. FINDINGS: Mild atelectasis at the left lung base. No pleural effusion or pneumothorax. Heart size is normal. IMPRESSION: Mild atelectasis at the left lung base. Electronically Signed   By: Macy Mis M.D.   On: 05/19/2019 11:02    [facility]   If 7PM-7AM, please contact night-coverage [email or web address]  05/21/2019, 10:14 AM

## 2019-05-21 NOTE — Consult Note (Signed)
Reason for Consult:stroke Referring Physician: DR. Cruzita Lederer  CC: stroke  HPI: Gordon [name] is an 63 y.o. male without known past medical history, on no medications as an outpatient and no primary physician admitted to the hospital for substernal chest pian.  He was found to be in A. fib with RVR and there was concern for STEMI.  On admission he also reportedly had intermittent confusion.  He underwent a cardiac catheterization on 12/7 status post unsuccessful balloon angioplasty of the RCA due to diffuse disease, calcification and thrombus.  He was also found to have moderately to severely reduced LV systolic function with an EF of 30-35% with inferior wall akinesis.  Last seen normal yesterday and he was found to have difficulty expressing himself around the time post cath.      Past Medical History:  Diagnosis Date  . Hypertension     Past Surgical History:  Procedure Laterality Date  . CORONARY BALLOON ANGIOPLASTY N/A 05/19/2019   Procedure: CORONARY BALLOON ANGIOPLASTY;  Surgeon: Wellington Hampshire, MD;  Location: [place] [facility];  Service: Cardiovascular;  Laterality: N/A;  . CORONARY/GRAFT ACUTE MI REVASCULARIZATION N/A 05/19/2019   Procedure: Coronary/Graft Acute MI Revascularization;  Surgeon: Wellington Hampshire, MD;  Location: [place] [facility];  Service: Cardiovascular;  Laterality: N/A;  . LEFT HEART CATH AND CORONARY ANGIOGRAPHY N/A 05/19/2019   Procedure: LEFT HEART CATH AND CORONARY ANGIOGRAPHY;  Surgeon: Wellington Hampshire, MD;  Location: [place] [facility];  Service: Cardiovascular;  Laterality: N/A;    History reviewed. No pertinent family history.  Social History:  reports that he has been smoking cigarettes. He has been smoking about 0.50 packs per day. He uses smokeless tobacco. No history on file for alcohol and drug.  Not on File  Medications: I have reviewed the patient's current medications.  ROS: Unable to obtain due to hard of hearing and confusion    Physical Examination: Blood pressure 128/83, pulse 70, temperature 97.9 F (36.6 C), temperature source Oral, resp. rate 18, height 5\' 8"  (1.727 m), weight 74.1 kg, SpO2 95 %.   Neurological Examination   Mental Status: Alert, oriented to name and location. Periods of expressive aphasia  Cranial Nerves: II: Discs flat bilaterally; Visual fields grossly normal, pupils equal, round, reactive to light and accommodation III,IV, VI: ptosis not present, extra-ocular motions intact bilaterally V,VII: smile symmetric, facial light touch sensation normal bilaterally VIII: hearing normal bilaterally IX,X: gag reflex present XI: bilateral shoulder shrug XII: midline tongue extension Motor: Right : Upper extremity   4+/5    Left:     Upper extremity   5/5  Lower extremity   4+/5     Lower extremity   5/5 Tone and bulk:normal tone throughout; no atrophy noted Sensory: Pinprick and light touch intact throughout, bilaterally Deep Tendon Reflexes: 1+ and symmetric throughout Plantars: Right: downgoing   Left: downgoing Cerebellar: normal finger-to-nose Gait: not tested      Laboratory Studies:   Basic Metabolic Panel: Recent Labs  Lab 05/19/19 1026 05/20/19 0408 05/21/19 0427  NA 135 135 137  K 3.2* 4.2 3.8  CL 104 107 108  CO2 16* 19* 18*  GLUCOSE 155* 107* 89  BUN 32* 32* 30*  CREATININE 1.78* 1.95* 2.01*  CALCIUM 8.5* 8.2* 8.1*    Liver Function Tests: Recent Labs  Lab 05/19/19 1026 05/20/19 0408  AST 21 22  ALT 16 18  ALKPHOS 76 67  BILITOT 1.0 1.0  PROT 6.5 5.9*  ALBUMIN 2.9* 2.6*  No results for input(s): LIPASE, AMYLASE in the last 168 hours. No results for input(s): AMMONIA in the last 168 hours.  CBC: Recent Labs  Lab 05/19/19 1026 05/20/19 0408 05/21/19 0425  WBC 11.7* 11.2* 10.2  HGB 13.3 12.2* 12.0*  HCT 38.3* 35.8* 36.4*  MCV 86.8 88.2 91.5  PLT 304 282 278    Cardiac Enzymes: No results for input(s): CKTOTAL, CKMB, CKMBINDEX, TROPONINI  in the last 168 hours.  BNP: Invalid input(s): POCBNP  CBG: Recent Labs  Lab 05/19/19 1443  GLUCAP 100*    Microbiology: Results for orders placed or performed during the hospital encounter of 05/19/19  SARS Coronavirus 2 by RT PCR (hospital order, performed in [facility] hospital lab) Nasopharyngeal Nasopharyngeal Swab     Status: None   Collection Time: 05/19/19 12:40 PM   Specimen: Nasopharyngeal Swab  Result Value Ref Range Status   SARS Coronavirus 2 NEGATIVE NEGATIVE Final    Comment: (NOTE) SARS-CoV-2 target nucleic acids are NOT DETECTED. The SARS-CoV-2 RNA is generally detectable in upper and lower respiratory specimens during the acute phase of infection. The lowest concentration of SARS-CoV-2 viral copies this assay can detect is 250 copies / mL. A negative result does not preclude SARS-CoV-2 infection and should not be used as the sole basis for treatment or other patient management decisions.  A negative result may occur with improper specimen collection / handling, submission of specimen other than nasopharyngeal swab, presence of viral mutation(s) within the areas targeted by this assay, and inadequate number of viral copies (<250 copies / mL). A negative result must be combined with clinical observations, patient history, and epidemiological information. Fact Sheet for Patients:   StrictlyIdeas.no Fact Sheet for Healthcare Providers: BankingDealers.co.za This test is not yet approved or cleared  by the [place] FDA and has been authorized for detection and/or diagnosis of SARS-CoV-2 by FDA under an Emergency Use Authorization (EUA).  This EUA will remain in effect (meaning this test can be used) for the duration of the COVID-19 declaration under Section 564(b)(1) of the Act, 21 U.S.C. section 360bbb-3(b)(1), unless the authorization is terminated or revoked sooner. Performed at [facility], [address] [place], [place] [address]   MRSA PCR Screening     Status: None   Collection Time: 05/19/19  3:57 PM   Specimen: Nasal Mucosa; Nasopharyngeal  Result Value Ref Range Status   MRSA by PCR NEGATIVE NEGATIVE Final    Comment:        The GeneXpert MRSA Assay (FDA approved for NASAL specimens only), is one component of a comprehensive MRSA colonization surveillance program. It is not intended to diagnose MRSA infection nor to guide or monitor treatment for MRSA infections. Performed at [facility], [address] [place], [place] [address]     Coagulation Studies: Recent Labs    05/19/19 1446  LABPROT 16.2*  INR 1.3*    Urinalysis:  Recent Labs  Lab 05/19/19 1059  COLORURINE YELLOW*  LABSPEC 1.012  PHURINE 5.0  GLUCOSEU NEGATIVE  HGBUR NEGATIVE  BILIRUBINUR NEGATIVE  KETONESUR NEGATIVE  PROTEINUR NEGATIVE  NITRITE NEGATIVE  LEUKOCYTESUR NEGATIVE    Lipid Panel:     Component Value Date/Time   CHOL 108 05/20/2019 0408   TRIG 54 05/20/2019 0408   HDL 22 (L) 05/20/2019 0408   CHOLHDL 4.9 05/20/2019 0408   VLDL 11 05/20/2019 0408   LDLCALC 75 05/20/2019 0408    HgbA1C: No results found for: HGBA1C  Urine Drug Screen:  No results found for: LABOPIA, COCAINSCRNUR, LABBENZ, AMPHETMU, THCU, LABBARB  Alcohol Level: No results for input(s): ETH in the last 168 hours.  Other results: EKG: A-fib rate controlled .  Imaging: Mr Brain Wo Contrast  Result Date: 05/21/2019 CLINICAL DATA:  Focal neuro deficit, greater than 6 hours, stroke suspected. EXAM: MRI HEAD WITHOUT CONTRAST TECHNIQUE: Multiplanar, multiecho pulse sequences of the brain and surrounding structures were obtained without intravenous contrast. COMPARISON:  Head CT 05/19/2019 FINDINGS: Brain: Mild-to-moderate motion degradation of multiple sequences. There is a moderate-sized acute cortical/subcortical infarct within the left MCA vascular territory measuring 5.1 x 3.3 x 4.4 cm  (AP x TV x CC). The infarct involves the left insula, a small portion of the superior left temporal lobe and the left parietal lobe, including the postcentral gyrus. Corresponding T2/FLAIR hyperintensity at this site. Small amount of petechial hemorrhage within the posterior left insula. There is a background of moderate chronic small vessel ischemic disease. Small chronic lacunar infarct within the left thalamus. No midline shift. No intracranial mass. No extra-axial fluid collection. Mild generalized parenchymal atrophy. Vascular: Flow voids maintained within the proximal large arterial vessels. Skull and upper cervical spine: No focal marrow lesion identified on motion degraded imaging. Sinuses/Orbits: Visualized orbits demonstrate no acute abnormality. Mild left sphenoid sinus mucosal thickening. No significant mastoid effusion. These results were called by telephone at the time of interpretation on 05/21/2019 at 1:09 pm to provider [facility] , who verbally acknowledged these results. IMPRESSION: 1. Motion degraded examination. 2. Moderate-sized acute cortical/subcortical infarct within the left MCA vascular territory as detailed. Small amount of petechial hemorrhage within the posterior left insula. 3. Background of moderate chronic small vessel ischemic disease. Tiny chronic left thalamic lacunar infarct. Electronically Signed   By: Kellie Simmering DO   On: 05/21/2019 13:09   US Renal  Result Date: 05/20/2019 CLINICAL DATA:  Renal insufficiency EXAM: RENAL / URINARY TRACT ULTRASOUND COMPLETE COMPARISON:  None. FINDINGS: Right Kidney: Renal measurements: 9.3 x 4.9 x 4.9 cm = volume: 104.3 mL. Increased cortical echogenicity. Left Kidney: Renal measurements: 9.6 x 6.1 x 5.5 cm = volume: 167.0 mL. Increased cortical echogenicity. Bladder: Appears normal for degree of bladder distention. Other: None. IMPRESSION: Increased cortical echogenicity bilaterally consistent with medical renal disease. No acute  abnormalities. Electronically Signed   By: Dorise Bullion III M.D   On: 05/20/2019 10:53   US Carotid Bilateral  Result Date: 05/21/2019 CLINICAL DATA:  63 year old male with a history stroke EXAM: BILATERAL CAROTID DUPLEX ULTRASOUND TECHNIQUE: Pearline Cables scale imaging, color Doppler and duplex ultrasound were performed of bilateral carotid and vertebral arteries in the neck. COMPARISON:  None. FINDINGS: Criteria: Quantification of carotid stenosis is based on velocity parameters that correlate the residual internal carotid diameter with NASCET-based stenosis levels, using the diameter of the distal internal carotid lumen as the denominator for stenosis measurement. The following velocity measurements were obtained: RIGHT ICA:  Systolic 80 cm/sec, Diastolic 25 cm/sec CCA:  86 cm/sec SYSTOLIC ICA/CCA RATIO:  0.9 ECA:  92 cm/sec LEFT ICA:  Systolic 74 cm/sec, Diastolic 21 cm/sec CCA:  123XX123 cm/sec SYSTOLIC ICA/CCA RATIO:  0.7 ECA:  123 cm/sec Right Brachial SBP: Not acquired Left Brachial SBP: Not acquired RIGHT CAROTID ARTERY: No significant calcifications of the right common carotid artery. Intermediate waveform maintained. Heterogeneous and partially calcified plaque at the right carotid bifurcation. No significant lumen shadowing. Low resistance waveform of the right ICA. No significant tortuosity. RIGHT VERTEBRAL ARTERY: Antegrade flow with low resistance waveform. LEFT CAROTID  ARTERY: No significant calcifications of the left common carotid artery. Intermediate waveform maintained. Heterogeneous and partially calcified plaque at the left carotid bifurcation without significant lumen shadowing. Low resistance waveform of the left ICA. No significant tortuosity. LEFT VERTEBRAL ARTERY: Antegrade flow. Late reversal of the systolic waveform with antegrade diastole. IMPRESSION: Color duplex indicates minimal heterogeneous and calcified plaque, with no hemodynamically significant stenosis by duplex criteria in the  extracranial cerebrovascular circulation. Waveform of the left vertebral artery indicates developing proximal left subclavian artery stenosis. Correlation with bilateral upper extremity blood pressure is recommended. Signed, Dulcy Fanny. Dellia Nims, RPVI [facility] Radiology Electronically Signed   By: Corrie Mckusick D.O.   On: 05/21/2019 12:39     Assessment/Plan:  63 y.o. male without known past medical history, on no medications as an outpatient and no primary physician admitted to the hospital for substernal chest pian.  He was found to be in A. fib with RVR and there was concern for STEMI.  On admission he also reportedly had intermittent confusion.  He underwent a cardiac catheterization on 12/7 status post unsuccessful balloon angioplasty of the RCA due to diffuse disease, calcification and thrombus.  He was also found to have moderately to severely reduced LV systolic function with an EF of 30-35% with inferior wall akinesis.  Last seen normal yesterday and he was found to have difficulty expressing himself around the time post cath.      - MRI brain with L MCA subcortical ischemia that is fairly decent in size along with some petechial hemorrhage in the area - [facility] reviewed on 12/7 with aspect 10 and no signs of ischemia.  Suspect stroke is about 74 days old - Please hold heparin gtt for next 2 days due to size of stroke and petechial hemorrhage - Repeat CTH in 2 days and if resolution of petechial hemorrhage and no further hemorrhage can restart anticoagulation and likely NOAC early next week for his A-fib along with poor EF.  - will follow.  - Heparin sq can be restarted.  05/21/2019, 1:20 PM

## 2019-05-21 NOTE — Progress Notes (Signed)
Pt having difficulties with fine motor movement- requiring RN  to assist pt with eating breakfast/ pt also seem to have difficulty finding words and repeating words/ MD made aware/ hospitalitis and neuro consult ordered/ MRI ordered/  RN called sister, Levada Dy to obtain pts baseline / she states this is new for him- he lives alone and is very independent- states she spoke with him yesterday morning and could tell "his head wasn't right " and that she informed MD of this over the phone/ will monitor close

## 2019-05-21 NOTE — Progress Notes (Signed)
Cardiovascular and Pulmonary Nurse Navigator Note:    Rounded on patient this a.m. Patient getting bath.  This RN checked to make sure patient had received, "Heart Attack Bouncing Back," booklet. Booklet at bedside.  Informed patient I would return to see patient prior to discharged to discuss Referral to Cardiac Rehab.  Patient appreciative of this RN stopping by.     NOTE:  MD has ordered a MRI of brain today  to rule out stroke.     Roanna Epley, RN, BSN, [facility]  Cardiovascular & Pulmonary Nurse Navigator  Direct Line: [phone]  Department Phone #: [phone] Fax: [phone]  Email Address: [email or web address]

## 2019-05-21 NOTE — Progress Notes (Signed)
Progress Note  Patient Name: Gordon [name] Date of Encounter: 05/21/2019  Primary Cardiologist:New - Dr. Fletcher Anon  Subjective   He denies any chest pain, shortness of breath, orthopnea, or lower extremity edema.  He also denies racing heart rate but does note some palpitations; however, he is unable to specify at what time they occur.    Of note, patient does not appear completely oriented this morning with baseline status unknown.  Will discuss with admitting cardiologist.  Inpatient Medications    Scheduled Meds: . aspirin  81 mg Oral Daily  . atorvastatin  40 mg Oral q1800  . carvedilol  12.5 mg Oral BID WC  . Chlorhexidine Gluconate Cloth  6 each Topical Daily  . clopidogrel  75 mg Oral Daily  . feeding supplement (ENSURE ENLIVE)  237 mL Oral BID BM  . influenza vac split quadrivalent PF  0.5 mL Intramuscular Tomorrow-1000  . sodium chloride flush  3 mL Intravenous Q12H   Continuous Infusions: . sodium chloride    . heparin 1,300 Units/hr (05/21/19 0011)   PRN Meds: sodium chloride, acetaminophen, ondansetron (ZOFRAN) IV, sodium chloride flush   Vital Signs    Vitals:   05/20/19 1533 05/20/19 1952 05/21/19 0410 05/21/19 0731  BP: (!) 151/91 118/81 129/80 128/83  Pulse: 74 67 72 70  Resp: 18 20 17 18   Temp: 97.6 F (36.4 C) 98 F (36.7 C) 97.7 F (36.5 C) 97.9 F (36.6 C)  TempSrc:  Oral Oral Oral  SpO2: 100% 99% 98% 95%  Weight:   74.1 kg   Height:        Intake/Output Summary (Last 24 hours) at 05/21/2019 0926 Last data filed at 05/21/2019 0416 Gross per 24 hour  Intake 287.47 ml  Output 500 ml  Net -212.53 ml   Last 3 Weights 05/21/2019 05/19/2019 05/19/2019  Weight (lbs) 163 lb 4.8 oz 161 lb 6 oz 160 lb  Weight (kg) 74.072 kg 73.2 kg 72.576 kg      Telemetry    Normal sinus rhythm with frequent ectopy- Personally Reviewed  ECG    No new tracings- Personally Reviewed  Physical Exam   GEN: No acute distress.  Lying comfortably in bed Neck: No  JVD Cardiac: RRR, no murmurs, rubs, or gallops.  Respiratory:  Poor inspiratory effort and bilaterally diminished breath sounds.  No crackles or wheezes noted. GI: Somewhat firm and distended. MS:  1+ bilateral lower extremity edema; No deformity. Neuro:  Nonfocal  Psych: Normal affect   Labs    High Sensitivity Troponin:   Recent Labs  Lab 05/19/19 1026 05/19/19 1446 05/19/19 1720  TROPONINIHS 3,929* 3,682* 3,729*      Cardiac EnzymesNo results for input(s): TROPONINI in the last 168 hours. No results for input(s): TROPIPOC in the last 168 hours.   Chemistry Recent Labs  Lab 05/19/19 1026 05/20/19 0408 05/21/19 0427  NA 135 135 137  K 3.2* 4.2 3.8  CL 104 107 108  CO2 16* 19* 18*  GLUCOSE 155* 107* 89  BUN 32* 32* 30*  CREATININE 1.78* 1.95* 2.01*  CALCIUM 8.5* 8.2* 8.1*  PROT 6.5 5.9*  --   ALBUMIN 2.9* 2.6*  --   AST 21 22  --   ALT 16 18  --   ALKPHOS 76 67  --   BILITOT 1.0 1.0  --   GFRNONAA 39* 35* 34*  GFRAA 46* 41* 39*  ANIONGAP 15 9 11      Hematology Recent Labs  Lab 05/19/19 1026  05/20/19 0408 05/21/19 0425  WBC 11.7* 11.2* 10.2  RBC 4.41 4.06* 3.98*  HGB 13.3 12.2* 12.0*  HCT 38.3* 35.8* 36.4*  MCV 86.8 88.2 91.5  MCH 30.2 30.0 30.2  MCHC 34.7 34.1 33.0  RDW 13.5 13.8 14.1  PLT 304 282 278    BNPNo results for input(s): BNP, PROBNP in the last 168 hours.   DDimer No results for input(s): DDIMER in the last 168 hours.   Radiology    Ct Head Wo Contrast  Result Date: 05/19/2019 CLINICAL DATA:  Encephalopathy EXAM: CT HEAD WITHOUT CONTRAST TECHNIQUE: Contiguous axial images were obtained from the base of the skull through the vertex without intravenous contrast. COMPARISON:  None FINDINGS: Brain: There is no acute intracranial hemorrhage, mass-effect, or edema. Gray-white differentiation is preserved. There is no extra-axial fluid collection. Patchy and confluent hypoattenuation in the supratentorial white matter is nonspecific but may  reflect moderate chronic microvascular ischemic changes. Small age-indeterminate infarct of the lateral right cerebellum. Ventricles and sulci are within normal limits in size and configuration. Vascular: There is atherosclerotic calcification at the skull base. Skull: Calvarium is unremarkable. Sinuses/Orbits: Circumferential left sphenoid sinus mucosal thickening with occluded outflow and mild mucoperiosteal thickening. Orbits are unremarkable. Other: Mastoid air cells are clear. IMPRESSION: No acute intracranial hemorrhage, mass effect, or evidence of acute infarction. Moderate chronic microvascular ischemic changes. Small age-indeterminate infarct of right cerebellum. 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 Electronically Signed   By: Macy Mis M.D.   On: 05/19/2019 12:45   US Renal  Result Date: 05/20/2019 CLINICAL DATA:  Renal insufficiency EXAM: RENAL / URINARY TRACT ULTRASOUND COMPLETE COMPARISON:  None. FINDINGS: Right Kidney: Renal measurements: 9.3 x 4.9 x 4.9 cm = volume: 104.3 mL. Increased cortical echogenicity. Left Kidney: Renal measurements: 9.6 x 6.1 x 5.5 cm = volume: 167.0 mL. Increased cortical echogenicity. Bladder: Appears normal for degree of bladder distention. Other: None. IMPRESSION: Increased cortical echogenicity bilaterally consistent with medical renal disease. No acute abnormalities. Electronically Signed   By: Dorise Bullion III M.D   On: 05/20/2019 10:53   Dg Chest Portable 1 View  Result Date: 05/19/2019 CLINICAL DATA:  Altered mental status EXAM: PORTABLE CHEST 1 VIEW COMPARISON:  None. FINDINGS: Mild atelectasis at the left lung base. No pleural effusion or pneumothorax. Heart size is normal. IMPRESSION: Mild atelectasis at the left lung base. Electronically Signed   By: Macy Mis M.D.   On: 05/19/2019 11:02    Cardiac Studies   LHC (127/12/2018): 1. Codominant coronary arteries with severe one-vessel coronary artery disease. The culprit is an occluded  proximal right coronary artery which is diffusely diseased and heavily calcified. Left to right collaterals were noted. 2. Moderately to severely reduced LV systolic function with an EF of 30 to 35% with akinesis of the inferior wall. 3. Mildly elevated left ventricular end-diastolic pressure at 21 mmHg. 4. Unsuccessful balloon angioplasty of the right coronary artery given diffuse disease, calcifications and thrombus. This was in spite of multiple balloon inflations throughout the whole vessel.  Echo 05/21/2019  1. Left ventricular ejection fraction, by visual estimation, is 30 to 35%. The left ventricle has moderate to severely decreased function. There is no left ventricular hypertrophy.  2. Left ventricular diastolic parameters are consistent with Grade II diastolic dysfunction (pseudonormalization).  3. There is severe hypokinesis of the inferior and inferoseptal walls.  4. There is septal "bounce" suggestive of increased intrapericardial pressure or constriction.  5. Global right ventricle has moderately reduced systolic function.The right ventricular  size is mildly enlarged. No increase in right ventricular wall thickness.  6. Left atrial size was normal.  7. Right atrial size was mildly dilated.  8. Small pericardial effusion.  9. Mild to moderate mitral annular calcification. 10. The mitral valve is degenerative. Mild mitral valve regurgitation. No evidence of mitral stenosis. 11. The tricuspid valve is not well visualized. Tricuspid valve regurgitation mild-moderate. 12. The aortic valve is tricuspid. Aortic valve regurgitation is not visualized. Mild to moderate aortic valve sclerosis/calcification without any evidence of aortic stenosis. 13. The pulmonic valve was not well visualized. Pulmonic valve regurgitation is not visualized. 14. Mildly dilated pulmonary artery. 15. Mildly elevated pulmonary artery systolic pressure. 16. The inferior vena cava is dilated in size with <50%  respiratory variability, suggesting right atrial pressure of 15 mmHg.  Patient Profile     63 y.o. male with a history of hypertension, tobacco use, and admitted with late presenting inferior ST elevation MI s/p unsuccessful PCI to RCA, complicated by ischemic cardiomyopathy and renal insufficiency of uncertain chronicity.  Assessment & Plan    Inferior ST elevation MI --Late presenting with chest pain at least 3 days prior to presentation.  HS Tn flat and consistent with late presentation. As above, PCI to RCA unsuccessful with diffuse disease and heavy calcification noted.  No chest pain this morning.  Completing 48 hours of IV heparin and should receive daily CBC with Hgb stable 12.2  12.0, as well as Hct and RBC. 12/7 EKG showed atrial fibrillation with recommendations as below to consider NOAC before discharge.  TTE as above with EF 30 to 35%, severe hypokinesis of the inferior / inferoseptal walls, septal bounce, small pericardial effusion, right heart enlargement, mildly elevated PASP, and further findings as outlined above.  At discharge, will need at least 12 months of dual antiplatelet therapy and ASA. Continue Coreg. ACE/ARB/ARNI/AA to be considered if BP and renal function allow. Addition of nitrates before discharge as well. Further recommendations as below and including statin therapy for aggressive risk factor modification.  Smoking cessation advised.  Acute systolic HF due to ICM --Mildly volume up on exam, which appears to be an increase in volume when compared with yesterday's physical exam.  Echo as above shows EF 30 to 35% and mildly elevated PASP.  Continue to monitor I's/O's, daily weights, and BMET. May need low dose diuretic at discharge as renal function tolerates and to include hydralazine. Addition of Bidil could be considered if needed for more BP support.  Continue Coreg.  Addition of ACE/ARB/ARNI has been deferred in the setting of renal insufficiency and should be  considered at follow-up if not added before discharge. Addition of nitrate therapy before discharge. Follow-up echo recommended to reassess EF in 3-4 months. If not before discharge, ARB / Entresto/ Arlyce Harman should be considered at follow-up.  Atrial fibrillation --As above, EKG showed atrial fibrillation; however, converted to NSR shortly after catheterization.  Rates have been well controlled on Coreg, started yesterday. Remains on heparin at this time. Before discharge, reassess risks and benefits of long-term anticoagulation in the setting of AMI. CHA2DS2VASc score of at least 3 (CHF, HTN, vascular), soon to be 4 with recommendation for long term [place].   Renal insufficiency --Continue to monitor with daily BMET. Slightly volume overloaded today with Cr slowly rising with improved BUN 1.95  2.01. BUN 32  30. Consider worsening Cr 2/2 volume overload. Avoid nephrotoxins. Consider renal ultrasound.  HTN --As above, metoprolol transition to carvedilol. BP improved today. Could consider  BiDil/long-acting nitrate, addition of hydralazine for BP control and heart failure therapy, if needed.  HLD --LDL 75. Statin decreased to lower side effects and should be continued.   For questions or updates, please contact [place] Please consult [email or web address] for contact info under        Signed, Arvil Chaco, PA-C  05/21/2019, 9:26 AM

## 2019-05-21 NOTE — Progress Notes (Signed)
ANTICOAGULATION CONSULT NOTE  Pharmacy Consult for heparin Indication: chest pain/ACS (NSTEMI)  Patient Measurements: Height: 5\' 8"  (172.7 cm) Weight: 161 lb 6 oz (73.2 kg) IBW/kg (Calculated) : 68.4 Heparin Dosing Weight: 72 kg  Vital Signs: Temp: 98 F (36.7 C) (12/08 1952) Temp Source: Oral (12/08 1952) BP: 118/81 (12/08 1952) Pulse Rate: 67 (12/08 1952)  Labs: Recent Labs    05/19/19 1026 05/19/19 1446 05/19/19 1720 05/20/19 0408 05/20/19 1405 05/20/19 2242  HGB 13.3  --   --  12.2*  --   --   HCT 38.3*  --   --  35.8*  --   --   PLT 304  --   --  282  --   --   APTT  --  >160*  --   --   --   --   LABPROT  --  16.2*  --   --   --   --   INR  --  1.3*  --   --   --   --   HEPARINUNFRC  --   --   --  <0.10* 0.28* 0.10*  CREATININE 1.78*  --   --  1.95*  --   --   TROPONINIHS 3,929* 3,682* 3,729*  --   --   --     Estimated Creatinine Clearance: 37 mL/min (A) (by C-G formula based on SCr of 1.95 mg/dL (H)).   Medical History: Past Medical History:  Diagnosis Date  . Hypertension     Assessment: 63 year old male presented with chest pain and confusion. Patient reports non-compliance with his antihypertensives. Confirmed with patient that he is not on anticoagulants PTA. Pharmacy consulted for heparin drip for NSTEMI. H&H trending down, PLT wnl  Heparin Course 12/7 initiation: 4000 unit bolus, then 800 units/hr 12/8 0408 HL<0.10: inc to 1000 units/hr 12/8 1405 HL 0.28: inc to 1100 units/hr 12/8 2242 HL 0.10: inc to 1300 units/hr  Goal of Therapy:  Heparin level 0.3-0.7 units/ml Monitor platelets by anticoagulation protocol: Yes   Plan:   Heparin level subtherapeutic: increase rate to 1300 units/hr  Re-check heparin level 6 hours after rate change  CBC in am  Per Cardiology note, plan to transition to [place] before discharge.  Ena Dawley, PharmD 05/21/2019,12:01 AM

## 2019-05-21 NOTE — Progress Notes (Signed)
   Notified by IM that patient has a large stroke with some petechial hemorrhage and neurology consulted with recommendation to hold heparin for at least 3 days and repeat CT scan on Friday. Appropriate to switch patient over to [employer], given these findings.   Signed, Arvil Chaco, PA-C 05/21/2019, 1:56 PM Pager [phone]

## 2019-05-22 DIAGNOSIS — N179 Acute kidney failure, unspecified: Secondary | ICD-10-CM

## 2019-05-22 DIAGNOSIS — I639 Cerebral infarction, unspecified: Secondary | ICD-10-CM

## 2019-05-22 DIAGNOSIS — I480 Paroxysmal atrial fibrillation: Secondary | ICD-10-CM

## 2019-05-22 DIAGNOSIS — R4701 Aphasia: Secondary | ICD-10-CM

## 2019-05-22 LAB — HEMOGLOBIN A1C
Hgb A1c MFr Bld: 6.1 % — ABNORMAL HIGH (ref 4.8–5.6)
Mean Plasma Glucose: 128.37 mg/dL

## 2019-05-22 NOTE — Progress Notes (Addendum)
Physical Therapy Treatment Patient Details Name: Gordon [name] MRN: [id number] DOB: 1955-10-05 Today's Date: 05/22/2019    History of Present Illness Gordon [name] is a 20yoM who comes to [facility] 12/7 c CP and gen weakness, notable HTN and AFcRVR, pt admitted c STEMI, HeadCT negaitve. Pt underwent a cardiac catheterization on 12/7 status post unsuccessful balloon angioplasty of the RCA due to diffuse disease, calcification and thrombus. Pt having expressive difficulty and motor control impairment of the right hemibody. Head CT/MRI showing acute CVA. PTA pt was fully independent, retired but looking ino to working part time in the future.    PT Comments    Pt in bed upon entry, agreeable to participate. Pt appears in better spirits this date. Pt given supervision for moving to EOB, help provided for catheter awareness, and problem solving when foot gets stuck in mattress at times as pt has limited awareness of limb. At EOB, seated balance remains intact. MinA and RW to rise to standing, although later pt attempts several STS unsupported for motor control training, minA required for Rt foot planting and intermittent MaxA for LOB. Pt able to AMB with intermittent MinA for hand placement and occasional LOB. Continued with standing balance training with double to single limb support for balance, as well as promotion of differentiation of BUE tasks. Pt continues to have impaired proproception of RUE. Overall expressive language difficulties remain apparent with continued intermittent frustrations. Pt also continues to have lability easily frustrated and agitated, but calming back down quickly.     Follow Up Recommendations  CIR;Supervision for mobility/OOB;Supervision - Intermittent     Equipment Recommendations  None recommended by PT(unclear at this time;)    Recommendations for Other Services       Precautions / Restrictions Precautions Precautions: Fall Restrictions Weight Bearing Restrictions:  No    Mobility  Bed Mobility Overal bed mobility: Needs Assistance Bed Mobility: Supine to Sit;Sit to Supine     Supine to sit: Min assist Sit to supine: Supervision   General bed mobility comments: supervision for safety regarding condom cath and Right heel getting stuck at EOB again.  Transfers Overall transfer level: Needs assistance Equipment used: Rolling walker (2 wheeled) Transfers: Sit to/from Stand Sit to Stand: Min assist         General transfer comment: Needs assist getting Rt hand placement on RW; difficulty following commands for LUE placement; has difficulty keeping Left knee flexed during  Ambulation/Gait Ambulation/Gait assistance: Min assist Gait Distance (Feet): 40 Feet(4x76ft, requires standing rest breaks between efforts to maintian good control of RLE during gait.) Assistive device: Rolling walker (2 wheeled)       General Gait Details: suprisingly good management of RW, some isntances of trunk instability minA correction c gait belt, difficult with smal radius turns (got trapped in a corner while turning). SOB after final interval.   Stairs             Wheelchair Mobility    Modified Rankin (Stroke Patients Only)       Balance Overall balance assessment: Modified Independent;Needs assistance Sitting-balance support: Feet supported Sitting balance-Leahy Scale: Good Sitting balance - Comments: Pt steady static and dynamic sitting EOB during functional tasks. No UE support this date.   Standing balance support: Single extremity supported;During functional activity Standing balance-Leahy Scale: Fair Standing balance comment: struggles unsupported, but single limb balance is fairly successful.  Cognition Arousal/Alertness: Awake/alert Behavior During Therapy: WFL for tasks assessed/performed;Agitated(eaily agitated with easy resolution.) Overall Cognitive Status: Impaired/Different from  baseline Area of Impairment: Safety/judgement;Following commands;Problem solving;Awareness                       Following Commands: Follows one step commands with increased time;Follows multi-step commands inconsistently Safety/Judgement: Decreased awareness of deficits;Decreased awareness of safety Awareness: Emergent Problem Solving: Difficulty sequencing;Requires tactile cues General Comments: has limited awareness of Rt limbs, limited sensation/proprioception.      Exercises Other Exercises Other Exercises: Single UE support on RW c contrlateral target tapping on daily status board using visual ane verbal cues, 1x10 bilat. Pt elects to use thumb tip for touching, acurracy improves over the course of reps. Other Exercises: STS from EOB hands free, smooth controlled movement and balance: 1x5, has difficulty with keeping Rt knee flexion, 1 significant LOB requires maxA for control. [place] provides feet block Right for last 3. Other Exercises: Normal stance, firm surface, no horizontal head turns 5x bilat c BUE support, 5x bilat c single UE support; vertical head turns x10 c RU Esupport (no LOB issues)    General Comments        Pertinent Vitals/Pain Pain Assessment: No/denies pain    Home Living Family/patient expects to be discharged to:: Private residence Living Arrangements: Alone Available Help at Discharge: Family;Available PRN/intermittently(Sister) Type of Home: Mobile home Home Access: Level entry   Home Layout: One level Home Equipment: None      Prior Function Level of Independence: Independent      Comments: no history of falls or balance issues;   PT Goals (current goals can now be found in the care plan section) Acute Rehab PT Goals Patient Stated Goal: To regain my independence PT Goal Formulation: With patient Time For Goal Achievement: 06/04/19 Potential to Achieve Goals: Fair Progress towards PT goals: Progressing toward goals     Frequency    7X/week      PT Plan Current plan remains appropriate    Co-evaluation              AM-PAC PT "6 Clicks" Mobility   Outcome Measure  Help needed turning from your back to your side while in a flat bed without using bedrails?: A Little Help needed moving from lying on your back to sitting on the side of a flat bed without using bedrails?: A Little Help needed moving to and from a bed to a chair (including a wheelchair)?: A Little Help needed standing up from a chair using your arms (e.g., wheelchair or bedside chair)?: A Little Help needed to walk in hospital room?: A Little Help needed climbing 3-5 steps with a railing? : A Lot 6 Click Score: 17    End of Session Equipment Utilized During Treatment: Gait belt Activity Tolerance: Patient tolerated treatment well;Patient limited by fatigue Patient left: with call bell/phone within reach;with chair alarm set;in bed Nurse Communication: Mobility status PT Visit Diagnosis: Unsteadiness on feet (R26.81);Difficulty in walking, not elsewhere classified (R26.2);Apraxia (R48.2);Ataxic gait (R26.0);Other abnormalities of gait and mobility (R26.89)     Time: [id number] PT Time Calculation (min) (ACUTE ONLY): 24 min  Charges:  $Neuromuscular Re-education: 23-37 mins                     12:44 PM, 05/22/19 Etta Grandchild, PT, DPT Physical Therapist - [facility]  [phone] ([place])    Buccola,Allan C 05/22/2019, 12:36 PM

## 2019-05-22 NOTE — Consult Note (Signed)
Subjective:  No complaints this AM. RUE appears stronger.    Past Medical History:  Diagnosis Date  . Hypertension     Past Surgical History:  Procedure Laterality Date  . CORONARY BALLOON ANGIOPLASTY N/A 05/19/2019   Procedure: CORONARY BALLOON ANGIOPLASTY;  Surgeon: Wellington Hampshire, MD;  Location: [facility];  Service: Cardiovascular;  Laterality: N/A;  . CORONARY/GRAFT ACUTE MI REVASCULARIZATION N/A 05/19/2019   Procedure: Coronary/Graft Acute MI Revascularization;  Surgeon: Wellington Hampshire, MD;  Location: [facility];  Service: Cardiovascular;  Laterality: N/A;  . LEFT HEART CATH AND CORONARY ANGIOGRAPHY N/A 05/19/2019   Procedure: LEFT HEART CATH AND CORONARY ANGIOGRAPHY;  Surgeon: Wellington Hampshire, MD;  Location: [facility];  Service: Cardiovascular;  Laterality: N/A;    History reviewed. No pertinent family history.  Social History:  reports that he has been smoking cigarettes. He has been smoking about 0.50 packs per day. He uses smokeless tobacco. No history on file for alcohol and drug.  Not on File  Medications: I have reviewed the patient's current medications.  ROS: Unable to obtain due to hard of hearing and confusion   Physical Examination: Blood pressure (!) 139/95, pulse 63, temperature 97.7 F (36.5 C), temperature source Oral, resp. rate 18, height 5\' 8"  (1.727 m), weight 73.8 kg, SpO2 99 %.   Neurological Examination   Mental Status: Alert, oriented to name and location. Periods of expressive aphasia  Cranial Nerves: II: Discs flat bilaterally; Visual fields grossly normal, pupils equal, round, reactive to light and accommodation III,IV, VI: ptosis not present, extra-ocular motions intact bilaterally V,VII: smile symmetric, facial light touch sensation normal bilaterally VIII: hearing normal bilaterally IX,X: gag reflex present XI: bilateral shoulder shrug XII: midline tongue extension Motor: Right : Upper extremity    4+/5    Left:     Upper extremity   5/5  Lower extremity   4+/5     Lower extremity   5/5 Tone and bulk:normal tone throughout; no atrophy noted Sensory: Pinprick and light touch intact throughout, bilaterally Deep Tendon Reflexes: 1+ and symmetric throughout Plantars: Right: downgoing   Left: downgoing Cerebellar: normal finger-to-nose Gait: not tested      Laboratory Studies:   Basic Metabolic Panel: Recent Labs  Lab 05/19/19 1026 05/20/19 0408 05/21/19 0427  NA 135 135 137  K 3.2* 4.2 3.8  CL 104 107 108  CO2 16* 19* 18*  GLUCOSE 155* 107* 89  BUN 32* 32* 30*  CREATININE 1.78* 1.95* 2.01*  CALCIUM 8.5* 8.2* 8.1*    Liver Function Tests: Recent Labs  Lab 05/19/19 1026 05/20/19 0408  AST 21 22  ALT 16 18  ALKPHOS 76 67  BILITOT 1.0 1.0  PROT 6.5 5.9*  ALBUMIN 2.9* 2.6*   No results for input(s): LIPASE, AMYLASE in the last 168 hours. No results for input(s): AMMONIA in the last 168 hours.  CBC: Recent Labs  Lab 05/19/19 1026 05/20/19 0408 05/21/19 0425  WBC 11.7* 11.2* 10.2  HGB 13.3 12.2* 12.0*  HCT 38.3* 35.8* 36.4*  MCV 86.8 88.2 91.5  PLT 304 282 278    Cardiac Enzymes: No results for input(s): CKTOTAL, CKMB, CKMBINDEX, TROPONINI in the last 168 hours.  BNP: Invalid input(s): POCBNP  CBG: Recent Labs  Lab 05/19/19 1443  GLUCAP 100*    Microbiology: Results for orders placed or performed during the hospital encounter of 05/19/19  SARS Coronavirus 2 by RT PCR (hospital order, performed in [facility] hospital lab)  Nasopharyngeal Nasopharyngeal Swab     Status: None   Collection Time: 05/19/19 12:40 PM   Specimen: Nasopharyngeal Swab  Result Value Ref Range Status   SARS Coronavirus 2 NEGATIVE NEGATIVE Final    Comment: (NOTE) SARS-CoV-2 target nucleic acids are NOT DETECTED. The SARS-CoV-2 RNA is generally detectable in upper and lower respiratory specimens during the acute phase of infection. The lowest concentration of SARS-CoV-2  viral copies this assay can detect is 250 copies / mL. A negative result does not preclude SARS-CoV-2 infection and should not be used as the sole basis for treatment or other patient management decisions.  A negative result may occur with improper specimen collection / handling, submission of specimen other than nasopharyngeal swab, presence of viral mutation(s) within the areas targeted by this assay, and inadequate number of viral copies (<250 copies / mL). A negative result must be combined with clinical observations, patient history, and epidemiological information. Fact Sheet for Patients:   StrictlyIdeas.no Fact Sheet for Healthcare Providers: BankingDealers.co.za This test is not yet approved or cleared  by the [place] FDA and has been authorized for detection and/or diagnosis of SARS-CoV-2 by FDA under an Emergency Use Authorization (EUA).  This EUA will remain in effect (meaning this test can be used) for the duration of the COVID-19 declaration under Section 564(b)(1) of the Act, 21 U.S.C. section 360bbb-3(b)(1), unless the authorization is terminated or revoked sooner. Performed at [facility], [address] [place], [place] [address]   MRSA PCR Screening     Status: None   Collection Time: 05/19/19  3:57 PM   Specimen: Nasal Mucosa; Nasopharyngeal  Result Value Ref Range Status   MRSA by PCR NEGATIVE NEGATIVE Final    Comment:        The GeneXpert MRSA Assay (FDA approved for NASAL specimens only), is one component of a comprehensive MRSA colonization surveillance program. It is not intended to diagnose MRSA infection nor to guide or monitor treatment for MRSA infections. Performed at [facility], [address] [place],  [address]     Coagulation Studies: Recent Labs    05/19/19 1446  LABPROT 16.2*  INR 1.3*    Urinalysis:  Recent Labs  Lab 05/19/19 1059  COLORURINE  YELLOW*  LABSPEC 1.012  PHURINE 5.0  GLUCOSEU NEGATIVE  HGBUR NEGATIVE  BILIRUBINUR NEGATIVE  KETONESUR NEGATIVE  PROTEINUR NEGATIVE  NITRITE NEGATIVE  LEUKOCYTESUR NEGATIVE    Lipid Panel:     Component Value Date/Time   CHOL 108 05/20/2019 0408   TRIG 54 05/20/2019 0408   HDL 22 (L) 05/20/2019 0408   CHOLHDL 4.9 05/20/2019 0408   VLDL 11 05/20/2019 0408   LDLCALC 75 05/20/2019 0408    HgbA1C: No results found for: HGBA1C  Urine Drug Screen:  No results found for: LABOPIA, COCAINSCRNUR, LABBENZ, AMPHETMU, THCU, LABBARB  Alcohol Level: No results for input(s): ETH in the last 168 hours.  Other results: EKG: A-fib rate controlled .  Imaging: MR BRAIN WO CONTRAST  Result Date: 05/21/2019 CLINICAL DATA:  Focal neuro deficit, greater than 6 hours, stroke suspected. EXAM: MRI HEAD WITHOUT CONTRAST TECHNIQUE: Multiplanar, multiecho pulse sequences of the brain and surrounding structures were obtained without intravenous contrast. COMPARISON:  Head CT 05/19/2019 FINDINGS: Brain: Mild-to-moderate motion degradation of multiple sequences. There is a moderate-sized acute cortical/subcortical infarct within the left MCA vascular territory measuring 5.1 x 3.3 x 4.4 cm (AP x TV x CC). The infarct involves the left insula, a small portion of  the superior left temporal lobe and the left parietal lobe, including the postcentral gyrus. Corresponding T2/FLAIR hyperintensity at this site. Small amount of petechial hemorrhage within the posterior left insula. There is a background of moderate chronic small vessel ischemic disease. Small chronic lacunar infarct within the left thalamus. No midline shift. No intracranial mass. No extra-axial fluid collection. Mild generalized parenchymal atrophy. Vascular: Flow voids maintained within the proximal large arterial vessels. Skull and upper cervical spine: No focal marrow lesion identified on motion degraded imaging. Sinuses/Orbits: Visualized orbits  demonstrate no acute abnormality. Mild left sphenoid sinus mucosal thickening. No significant mastoid effusion. These results were called by telephone at the time of interpretation on 05/21/2019 at 1:09 pm to provider [facility] , who verbally acknowledged these results. IMPRESSION: 1. Motion degraded examination. 2. Moderate-sized acute cortical/subcortical infarct within the left MCA vascular territory as detailed. Small amount of petechial hemorrhage within the posterior left insula. 3. Background of moderate chronic small vessel ischemic disease. Tiny chronic left thalamic lacunar infarct. Electronically Signed   By: Kellie Simmering DO   On: 05/21/2019 13:09   US Carotid Bilateral  Result Date: 05/21/2019 CLINICAL DATA:  63 year old male with a history stroke EXAM: BILATERAL CAROTID DUPLEX ULTRASOUND TECHNIQUE: Pearline Cables scale imaging, color Doppler and duplex ultrasound were performed of bilateral carotid and vertebral arteries in the neck. COMPARISON:  None. FINDINGS: Criteria: Quantification of carotid stenosis is based on velocity parameters that correlate the residual internal carotid diameter with NASCET-based stenosis levels, using the diameter of the distal internal carotid lumen as the denominator for stenosis measurement. The following velocity measurements were obtained: RIGHT ICA:  Systolic 80 cm/sec, Diastolic 25 cm/sec CCA:  86 cm/sec SYSTOLIC ICA/CCA RATIO:  0.9 ECA:  92 cm/sec LEFT ICA:  Systolic 74 cm/sec, Diastolic 21 cm/sec CCA:  123XX123 cm/sec SYSTOLIC ICA/CCA RATIO:  0.7 ECA:  123 cm/sec Right Brachial SBP: Not acquired Left Brachial SBP: Not acquired RIGHT CAROTID ARTERY: No significant calcifications of the right common carotid artery. Intermediate waveform maintained. Heterogeneous and partially calcified plaque at the right carotid bifurcation. No significant lumen shadowing. Low resistance waveform of the right ICA. No significant tortuosity. RIGHT VERTEBRAL ARTERY: Antegrade flow with low  resistance waveform. LEFT CAROTID ARTERY: No significant calcifications of the left common carotid artery. Intermediate waveform maintained. Heterogeneous and partially calcified plaque at the left carotid bifurcation without significant lumen shadowing. Low resistance waveform of the left ICA. No significant tortuosity. LEFT VERTEBRAL ARTERY: Antegrade flow. Late reversal of the systolic waveform with antegrade diastole. IMPRESSION: Color duplex indicates minimal heterogeneous and calcified plaque, with no hemodynamically significant stenosis by duplex criteria in the extracranial cerebrovascular circulation. Waveform of the left vertebral artery indicates developing proximal left subclavian artery stenosis. Correlation with bilateral upper extremity blood pressure is recommended. Signed, Dulcy Fanny. Dellia Nims, RPVI [facility] Radiology Electronically Signed   By: Corrie Mckusick D.O.   On: 05/21/2019 12:39     Assessment/Plan:  63 y.o. male without known past medical history, on no medications as an outpatient and no primary physician admitted to the hospital for substernal chest pian.  He was found to be in A. fib with RVR and there was concern for STEMI.  On admission he also reportedly had intermittent confusion.  He underwent a cardiac catheterization on 12/7 status post unsuccessful balloon angioplasty of the RCA due to diffuse disease, calcification and thrombus.  He was also found to have moderately to severely reduced LV systolic function with  an EF of 30-35% with inferior wall akinesis.  Last seen normal yesterday and he was found to have difficulty expressing himself around the time post cath.      - MRI brain with L MCA subcortical ischemia that is fairly decent in size along with some petechial hemorrhage in the area - [facility] reviewed on 12/7 with aspect 10 and no signs of ischemia.  Suspect stroke is about 83 days old - Please hold heparin gtt until  tomorrow when [facility] will be repeated to make sure no hemorrhage.  - Can restart anticoagulation and likely NOAC early next week for his A-fib along with poor EF.  - will follow.   05/22/2019, 11:54 AM

## 2019-05-22 NOTE — Evaluation (Addendum)
Speech Language Pathology Evaluation Patient Details Name: Gordon [name] MRN: [id number] DOB: Aug 03, 1955 Today's Date: 05/22/2019 Time: [id number] SLP Time Calculation (min) (ACUTE ONLY): 60 min  Problem List:  Patient Active Problem List   Diagnosis Date Noted  . Acute CVA (cerebrovascular accident) ([place])   . Aphasia   . AF (paroxysmal atrial fibrillation) ([place])   . Acute renal failure superimposed on stage 3a chronic kidney disease ([place])   . Atrial fibrillation with rapid ventricular response ([place])   . HFrEF (heart failure with reduced ejection fraction) ([place])   . ST elevation myocardial infarction (STEMI) ([place]) 05/19/2019   Past Medical History:  Past Medical History:  Diagnosis Date  . Hypertension    Past Surgical History:  Past Surgical History:  Procedure Laterality Date  . CORONARY BALLOON ANGIOPLASTY N/A 05/19/2019   Procedure: CORONARY BALLOON ANGIOPLASTY;  Surgeon: Wellington Hampshire, MD;  Location: [place] [facility];  Service: Cardiovascular;  Laterality: N/A;  . CORONARY/GRAFT ACUTE MI REVASCULARIZATION N/A 05/19/2019   Procedure: Coronary/Graft Acute MI Revascularization;  Surgeon: Wellington Hampshire, MD;  Location: [place] [facility];  Service: Cardiovascular;  Laterality: N/A;  . LEFT HEART CATH AND CORONARY ANGIOGRAPHY N/A 05/19/2019   Procedure: LEFT HEART CATH AND CORONARY ANGIOGRAPHY;  Surgeon: Wellington Hampshire, MD;  Location: [place] [facility];  Service: Cardiovascular;  Laterality: N/A;   HPI:  Pt is a 63yo male who comes to [facility] 12/7  w/ c/o CP and weakness, notable HTN and AFcRVR. Pt endorsed s/s of issues including chest pain since Thanksgiving; code STEMI initiated by MDs. Head CT negaitve. Pt underwent a cardiac catheterization on 12/7 status post unsuccessful balloon angioplasty of the RCA due to diffuse disease, calcification and thrombus. Pt having expressive difficulty and motor control impairment of the right hemibody post precedure. Head CT/MRI  following revealed acute CVA. PTA pt was fully independent, retired. Pt verbally engaging and eager to attempt communication but is quite hampered by the expressive deficits. Speech clear.    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 This significantly impacts his ability to verbally communicate in functional communication/ADLs. Pt also exhibits impaired proprioception and differentiation of R/L in tasks. He is able to read at the Word Level aloud often but again hampered by expressive deficits when the task becomes verbal in nature. Pt's expressive language was c/b by deficits of phonemic and semantic paraphasias; Apraxia and groping w/ increased motor behaviors of limbs/body; jargon w/ unintelligible speech. Pt was mostly aware of his speech errors and would immediately try to self-correct but when unable to, he became frustrated. He often stated "I'm going to try to do/say that" in a clear automatic statement when he was presented w/ a task/question. Confrontational tasks were mostly in error; few Automatic speech tasks were successful given verbal cues and melodic intonation to engage successful verbal initiation by pt(numbers 1-10, DOW, greetings, "turn the TV off right now"). He often recognized his errors and attempted to self-correct. Auditory comprehension tasks appeared grossly [facility] w/ basic tasks, but full assessment was difficult d/t the degree of expressive deficits and Apraxia. No Dysarthria noted; speech clear in greetings as well as few tasks. Pt responded well to trying/following along w/ SLP as strategies were attempted; the easy onset initiation phrase("my name is ___"; "I live in ___"; this is a ___") and discussing object attributes/use was fairly successful; pt did not respond immeidately to phonemic cues.  Recommend further skilled ST services for more formal assessment of Aphasia and  [place]; therapy and teaching of strategies in order to maximize functional communication, safety, and independence thus reduce caregiver burden. Upon hospital discharge, recommend pt discharge to CIR for acute inpatient rehabilitation to maximize communication and return to PLOF.    SLP Assessment  SLP Recommendation/Assessment: Patient needs continued Speech Lanaguage Pathology Services SLP Visit Diagnosis: Apraxia (R48.2);Aphasia (R47.01)    Follow Up Recommendations  Inpatient Rehab    Frequency and Duration min 3x week  2 weeks      SLP Evaluation Cognition  Overall Cognitive Status: Difficult to assess(d/t degree of Expressive deficits) Arousal/Alertness: Awake/alert Orientation Level: Oriented to person;Oriented to place;Oriented to situation("I had a stroke") Attention: Focused;Sustained Focused Attention: Appears intact Sustained Attention: Appears intact Memory: (DNT) Awareness: Appears intact Problem Solving: (DNT) Behaviors: Restless;Poor frustration tolerance;Lability(min overall) Safety/Judgment: Appears intact Comments: he nodded readily and stated "I know" to need to stay in bed d/t risk for falling       Comprehension  Auditory Comprehension Overall Auditory Comprehension: Appears within functional limits for tasks assessed(at this bedside screening) Yes/No Questions: Within Functional Limits(self-corrected w/ accuracy as difficulty increased) Commands: Within Functional Limits(1-2 step) Conversation: Simple Other Conversation Comments: expressive deficits Interfering Components: (apraxic-like behaviors) EffectiveTechniques: Extra processing time(given) Reading Comprehension Reading Status: Within funtional limits(grossly w/ few 1-2 word food names on menu)    Expression Expression Primary Mode of Expression: Verbal Verbal Expression Overall Verbal Expression: Impaired Initiation: Impaired Automatic Speech: (impaired but improved w/ easy onset  phrases) Level of Generative/Spontaneous Verbalization: Word Repetition: Impaired Level of Impairment: Word level(intermittent; more impairment at phrase level) Naming: Impairment Responsive: 0-25% accurate Confrontation: Impaired Convergent: 0-24% accurate Other Naming Comments: phonemic and semantic paraphasias; Apraxia and groping w/ increased motor behaviors of limbs/body; jargon Verbal Errors: Semantic paraphasias;Phonemic paraphasias;Aware of errors;Jargon Pragmatics: Impairment Impairments: (min labile intermittently w/ SLP) Interfering Components: Speech intelligibility(frustration) Effective Techniques: Melodic intonation;Semantic cues(easy onset phrase to initiate) Non-Verbal Means of Communication: Gestures(were helpful) Written Expression Dominant Hand: Right Written Expression: Not tested   Oral / Motor  Oral Motor/Sensory Function Overall Oral Motor/Sensory Function: Within functional limits(no overt lingual-labial weakness)   GO                      [name], MS, CCC-SLP Julisa Flippo 05/22/2019, 4:08 PM

## 2019-05-22 NOTE — Progress Notes (Signed)
Inpatient Rehabilitation-Admissions Coordinator   Please see PM&R note from Dr. Ranell Patrick for details. Pt will not require an IP Rehab stay at this time. Dr. Ranell Patrick has discussed her recommendations with the patient. AC will sign off and communicate with TOC team.   Please call if questions.   Jhonnie Garner, OTR/L  Rehab Admissions Coordinator  [phone] 05/22/2019 4:10 PM

## 2019-05-22 NOTE — Plan of Care (Signed)
  Problem: Education: Goal: Knowledge of General Education information will improve Description: Including pain rating scale, medication(s)/side effects and non-pharmacologic comfort measures Outcome: Progressing   Problem: Health Behavior/Discharge Planning: Goal: Ability to manage health-related needs will improve Outcome: Progressing   Problem: Clinical Measurements: Goal: Ability to maintain clinical measurements within normal limits will improve Outcome: Progressing Goal: Will remain free from infection Outcome: Progressing Goal: Diagnostic test results will improve Outcome: Progressing Goal: Respiratory complications will improve Outcome: Not Progressing Note: Pt desaturated to 82% on RA while at rest.  Resumed oxygen at 2L/min [place] and SpO2 now 95%. Goal: Cardiovascular complication will be avoided Outcome: Progressing   Problem: Activity: Goal: Risk for activity intolerance will decrease Outcome: Progressing Note: Pt OOB in chair x4 hours; tolerated well.   Problem: Nutrition: Goal: Adequate nutrition will be maintained Outcome: Progressing   Problem: Coping: Goal: Level of anxiety will decrease Outcome: Progressing   Problem: Elimination: Goal: Will not experience complications related to bowel motility Outcome: Not Progressing Note: Lactulose given for c/o mild constipation. Goal: Will not experience complications related to urinary retention Outcome: Progressing   Problem: Pain Managment: Goal: General experience of comfort will improve Outcome: Progressing   Problem: Safety: Goal: Ability to remain free from injury will improve Outcome: Progressing   Problem: Skin Integrity: Goal: Risk for impaired skin integrity will decrease Outcome: Progressing

## 2019-05-22 NOTE — Progress Notes (Signed)
[place] PHYSICAL MEDICINE AND REHABILITATION  CONSULT SERVICE NOTE       Physical Medicine and Rehabilitation Consult Reason for Consult: Impaired mobility and ADLs following stroke. Referring Physician: Dr. Loletha Grayer   HPI: Gordon [name] is a 63 y.o. male without PMH who presented to [facility] with substernal chest pain and was found to be in Afib with RVR with concern for STEMI. He was found to have intermittent confusion and underwent cardiac catheterization on 12/7 s/p an unsuccessful balloon angioplasty of the RCA due to diffuse disease, calcification, and thrombus. He was found to have moderately to severely reduced LV systolic function with an EF of 30-35% with inferior wall akinesis.    ROS Past Medical History:  Diagnosis Date   Hypertension    Past Surgical History:  Procedure Laterality Date   CORONARY BALLOON ANGIOPLASTY N/A 05/19/2019   Procedure: CORONARY BALLOON ANGIOPLASTY;  Surgeon: Wellington Hampshire, MD;  Location: [place] [facility];  Service: Cardiovascular;  Laterality: N/A;   CORONARY/GRAFT ACUTE MI REVASCULARIZATION N/A 05/19/2019   Procedure: Coronary/Graft Acute MI Revascularization;  Surgeon: Wellington Hampshire, MD;  Location: [place] [facility];  Service: Cardiovascular;  Laterality: N/A;   LEFT HEART CATH AND CORONARY ANGIOGRAPHY N/A 05/19/2019   Procedure: LEFT HEART CATH AND CORONARY ANGIOGRAPHY;  Surgeon: Wellington Hampshire, MD;  Location: [place] [facility];  Service: Cardiovascular;  Laterality: N/A;   History reviewed. No pertinent family history. Social History:  reports that he has been smoking cigarettes. He has been smoking about 0.50 packs per day. He uses smokeless tobacco. No history on file for alcohol and drug. Allergies: Not on File No medications prior to admission.    Home: Home Living Family/patient expects to be discharged to:: Private residence Living Arrangements: Alone Available Help at Discharge: Family, Available  PRN/intermittently(Sister) Type of Home: Mobile home Home Access: Level entry Home Layout: One level Bathroom Shower/Tub: Multimedia programmer: Standard Home Equipment: None  Functional History: Prior Function Level of Independence: Independent Comments: no history of falls or balance issues; Functional Status:  Mobility: Bed Mobility Overal bed mobility: Needs Assistance Bed Mobility: Supine to Sit, Sit to Supine Supine to sit: Min assist Sit to supine: Supervision General bed mobility comments: supervision for safety regarding condom cath and Right heel getting stuck at EOB again. Transfers Overall transfer level: Needs assistance Equipment used: Rolling walker (2 wheeled) Transfers: Sit to/from Stand Sit to Stand: Min assist General transfer comment: Needs assist getting Rt hand placement on RW; difficulty following commands for LUE placement; has difficulty keeping Left knee flexed during Ambulation/Gait Ambulation/Gait assistance: Min assist Gait Distance (Feet): 40 Feet(4x30ft, requires standing rest breaks between efforts to maintian good control of RLE during gait.) Assistive device: Rolling walker (2 wheeled) Gait Pattern/deviations: Step-to pattern General Gait Details: suprisingly good management of RW, some isntances of trunk instability minA correction c gait belt, difficult with smal radius turns (got trapped in a corner while turning). SOB after final interval.    ADL: ADL Overall ADL's : Needs assistance/impaired Eating/Feeding: Set up, Supervision/ safety, Minimal assistance, Sitting, Cueing for compensatory techinques, With adaptive utensils Eating/Feeding Details (indicate cue type and reason): Pt requires min A for cutting items on meal tray and occasional HOH assistance to bring food to mouth. OT educated pt on compensatory eating strategies including 1-handed cutting and provided red foam built-up handle this date. Pt expressed improved  satisfaction with self-feeding ability with use of built-up handle, but continues to be functionally  limited by decreased fine motor coordination and poor motor planning. General ADL Comments: Pt functionally limited by R sided weakness, difficulty with R/L differentiation, and decreased motor planning. Requires CGA-min assist for functional mobility. Min assist for self-feeding. Likely minimal to moderate assist for more complex motor tasks such as bathing and dressing.  Cognition: Cognition Overall Cognitive Status: Impaired/Different from baseline Orientation Level: (P) Oriented X4, Other (comment)(expressive aphasia) Cognition Arousal/Alertness: Awake/alert Behavior During Therapy: WFL for tasks assessed/performed, Agitated(eaily agitated with easy resolution.) Overall Cognitive Status: Impaired/Different from baseline Area of Impairment: Safety/judgement, Following commands, Problem solving, Awareness Following Commands: Follows one step commands with increased time, Follows multi-step commands inconsistently Safety/Judgement: Decreased awareness of deficits, Decreased awareness of safety Awareness: Emergent Problem Solving: Difficulty sequencing, Requires tactile cues General Comments: has limited awareness of Rt limbs, limited sensation/proprioception.  Blood pressure (!) 139/95, pulse 63, temperature 97.7 F (36.5 C), temperature source Oral, resp. rate 18, height 5\' 8"  (1.727 m), weight 73.8 kg, SpO2 99 %. Physical Exam   Gen: no distress, normal appearing HEENT: oral mucosa pink and moist, NCAT Cardio: Reg rate Chest: normal effort, normal rate of breathing Abd: soft, non-distended Ext: no edema Skin: intact Neuro/MSK: AOx3. CN2-12 intact bilaterally. 5/5 strength on left side, 4+/5 throughout right side. Able to follow commands. Finger-to-nose intact bilaterally. Sensation intact throughout.  Psych: pleasant, normal affect  No results found for this or any previous visit  (from the past 24 hour(s)). MR BRAIN WO CONTRAST  Result Date: 05/21/2019 CLINICAL DATA:  Focal neuro deficit, greater than 6 hours, stroke suspected. EXAM: MRI HEAD WITHOUT CONTRAST TECHNIQUE: Multiplanar, multiecho pulse sequences of the brain and surrounding structures were obtained without intravenous contrast. COMPARISON:  Head CT 05/19/2019 FINDINGS: Brain: Mild-to-moderate motion degradation of multiple sequences. There is a moderate-sized acute cortical/subcortical infarct within the left MCA vascular territory measuring 5.1 x 3.3 x 4.4 cm (AP x TV x CC). The infarct involves the left insula, a small portion of the superior left temporal lobe and the left parietal lobe, including the postcentral gyrus. Corresponding T2/FLAIR hyperintensity at this site. Small amount of petechial hemorrhage within the posterior left insula. There is a background of moderate chronic small vessel ischemic disease. Small chronic lacunar infarct within the left thalamus. No midline shift. No intracranial mass. No extra-axial fluid collection. Mild generalized parenchymal atrophy. Vascular: Flow voids maintained within the proximal large arterial vessels. Skull and upper cervical spine: No focal marrow lesion identified on motion degraded imaging. Sinuses/Orbits: Visualized orbits demonstrate no acute abnormality. Mild left sphenoid sinus mucosal thickening. No significant mastoid effusion. These results were called by telephone at the time of interpretation on 05/21/2019 at 1:09 pm to provider [facility] , who verbally acknowledged these results. IMPRESSION: 1. Motion degraded examination. 2. Moderate-sized acute cortical/subcortical infarct within the left MCA vascular territory as detailed. Small amount of petechial hemorrhage within the posterior left insula. 3. Background of moderate chronic small vessel ischemic disease. Tiny chronic left thalamic lacunar infarct. Electronically Signed   By: Kellie Simmering DO   On:  05/21/2019 13:09   US Carotid Bilateral  Result Date: 05/21/2019 CLINICAL DATA:  63 year old male with a history stroke EXAM: BILATERAL CAROTID DUPLEX ULTRASOUND TECHNIQUE: Pearline Cables scale imaging, color Doppler and duplex ultrasound were performed of bilateral carotid and vertebral arteries in the neck. COMPARISON:  None. FINDINGS: Criteria: Quantification of carotid stenosis is based on velocity parameters that correlate the residual internal carotid diameter with NASCET-based stenosis levels, using the diameter of the distal  internal carotid lumen as the denominator for stenosis measurement. The following velocity measurements were obtained: RIGHT ICA:  Systolic 80 cm/sec, Diastolic 25 cm/sec CCA:  86 cm/sec SYSTOLIC ICA/CCA RATIO:  0.9 ECA:  92 cm/sec LEFT ICA:  Systolic 74 cm/sec, Diastolic 21 cm/sec CCA:  123XX123 cm/sec SYSTOLIC ICA/CCA RATIO:  0.7 ECA:  123 cm/sec Right Brachial SBP: Not acquired Left Brachial SBP: Not acquired RIGHT CAROTID ARTERY: No significant calcifications of the right common carotid artery. Intermediate waveform maintained. Heterogeneous and partially calcified plaque at the right carotid bifurcation. No significant lumen shadowing. Low resistance waveform of the right ICA. No significant tortuosity. RIGHT VERTEBRAL ARTERY: Antegrade flow with low resistance waveform. LEFT CAROTID ARTERY: No significant calcifications of the left common carotid artery. Intermediate waveform maintained. Heterogeneous and partially calcified plaque at the left carotid bifurcation without significant lumen shadowing. Low resistance waveform of the left ICA. No significant tortuosity. LEFT VERTEBRAL ARTERY: Antegrade flow. Late reversal of the systolic waveform with antegrade diastole. IMPRESSION: Color duplex indicates minimal heterogeneous and calcified plaque, with no hemodynamically significant stenosis by duplex criteria in the extracranial cerebrovascular circulation. Waveform of the left vertebral artery  indicates developing proximal left subclavian artery stenosis. Correlation with bilateral upper extremity blood pressure is recommended. Signed, Dulcy Fanny. Dellia Nims, RPVI [facility] Rad[facility] Electronically Signed   By: Corrie Mckusick D.O.   On: 05/21/2019 12:39    Assessment/Plan: Gordon [name] is a 63 year old man presenting with right sided weakness, gross and fine motor apraxia, and cognitive difficulty following a L MCA stroke. On examination he demonstrates good cognition and strength and is highly motivated. He has been able to walk with RW 40 feet x4 MinA with rest breaks and perform ADLs MinA. He will not require intensive rehabilitation in acute rehab and can be discharged home with family supervision and support.  Thank you for this consult. I will order physiatry outpatient follow-up to assist in his rehabilitation upon discharge.   Izora Ribas, MD 05/22/2019

## 2019-05-22 NOTE — NC FL2 (Signed)
[place] LEVEL OF CARE SCREENING TOOL     IDENTIFICATION  Patient Name: Gordon [name] Birthdate: February 21, 1956 Sex: male Admission Date (Current Location): 05/19/2019  [place] and [place] Number:  Engineering geologist and Address:  [facility], [address] [place], [place] [address]      Provider Number: [id number]  Attending Physician Name and Address:  Loletha Grayer, MD  Relative Name and Phone Number:  Ancil Linsey Sister [phone]  [phone] or Tonna Boehringer Niece   [phone]    Current Level of Care: Hospital Recommended Level of Care: [place] Prior Approval Number:    Date Approved/Denied:   PASRR Number: [id number]  Discharge Plan: SNF    Current Diagnoses: Patient Active Problem List   Diagnosis Date Noted  . Acute CVA (cerebrovascular accident) ([place])   . Aphasia   . AF (paroxysmal atrial fibrillation) ([place])   . Acute renal failure superimposed on stage 3a chronic kidney disease ([place])   . Atrial fibrillation with rapid ventricular response ([place])   . HFrEF (heart failure with reduced ejection fraction) ([place])   . ST elevation myocardial infarction (STEMI) ([place]) 05/19/2019    Orientation RESPIRATION BLADDER Height & Weight     Self, Time, Place  Normal Continent Weight: 162 lb 11.2 oz (73.8 kg) Height:  5\' 8"  (172.7 cm)  BEHAVIORAL SYMPTOMS/MOOD NEUROLOGICAL BOWEL NUTRITION STATUS      Continent Diet(Cardiac)  AMBULATORY STATUS COMMUNICATION OF NEEDS Skin   Limited Assist Verbally Normal                       Personal Care Assistance Level of Assistance  Feeding, Bathing, Dressing Bathing Assistance: Limited assistance Feeding assistance: Limited assistance Dressing Assistance: Limited assistance     Functional Limitations Info  Sight, Hearing, Speech Sight Info: Adequate Hearing Info: Adequate Speech Info: Adequate    SPECIAL CARE FACTORS FREQUENCY  PT (By licensed  PT), OT (By licensed OT)     PT Frequency: Minimum 5x a week OT Frequency: Minimum 5x a week            Contractures Contractures Info: Not present    Additional Factors Info  Code Status, Allergies Code Status Info: Full Code Allergies Info: NKA           Current Medications (05/22/2019):  This is the current hospital active medication list Current Facility-Administered Medications  Medication Dose Route Frequency Provider Last Rate Last Admin  . 0.9 %  sodium chloride infusion  250 mL Intravenous PRN Kathlyn Sacramento A, MD      . acetaminophen (TYLENOL) tablet 650 mg  650 mg Oral Q4H PRN Kathlyn Sacramento A, MD      . aspirin chewable tablet 81 mg  81 mg Oral Daily Kathlyn Sacramento A, MD   81 mg at 05/22/19 1106  . atorvastatin (LIPITOR) tablet 40 mg  40 mg Oral q1800 End, Christopher, MD   40 mg at 05/21/19 1716  . carvedilol (COREG) tablet 12.5 mg  12.5 mg Oral BID WC End, Christopher, MD   12.5 mg at 05/22/19 1216  . Chlorhexidine Gluconate Cloth 2 % PADS 6 each  6 each Topical Daily Wellington Hampshire, MD   6 each at 05/21/19 [phone]  . clopidogrel (PLAVIX) tablet 75 mg  75 mg Oral Daily Kathlyn Sacramento A, MD   75 mg at 05/22/19 1106  . feeding supplement (ENSURE ENLIVE) (ENSURE ENLIVE) liquid 237 mL  237 mL Oral BID  BM Wellington Hampshire, MD   237 mL at 05/22/19 1556  . isosorbide-hydrALAZINE (BIDIL) 20-37.5 MG per tablet 1 tablet  1 tablet Oral TID Kate Sable, MD   1 tablet at 05/22/19 1217  . ondansetron (ZOFRAN) injection 4 mg  4 mg Intravenous Q6H PRN Kathlyn Sacramento A, MD      . sodium chloride flush (NS) 0.9 % injection 3 mL  3 mL Intravenous Q12H Kathlyn Sacramento A, MD   3 mL at 05/22/19 1216  . sodium chloride flush (NS) 0.9 % injection 3 mL  3 mL Intravenous PRN Wellington Hampshire, MD         Discharge Medications: Please see discharge summary for a list of discharge medications.  Relevant Imaging Results:  Relevant Lab Results:   Additional  Information SSN [id number]  Ross Ludwig, LCSW

## 2019-05-22 NOTE — Progress Notes (Signed)
Rehab Admissions Coordinator Note:  Per PT recommendation, this patient was screened by Raechel Ache for appropriateness for an Inpatient Acute Rehab Consult.  At this time, we are recommending an Inpatient Rehab consult.  AC will place a consult order in the chart to allow for further assessment and candidacy for IP Rehab.   Raechel Ache 05/22/2019, 9:46 AM  I can be reached at [phone].

## 2019-05-22 NOTE — Progress Notes (Signed)
Patient ID: Demitre Borts, male   DOB: 1956-03-13, 63 y.o.   MRN: [id number] [facility] PROGRESS NOTE  Dwan Coady [id number] DOB: [birth year] DOA: 05/19/2019 PCP: System, Pcp Not In  HPI/Subjective: Patient having difficulty expressing himself and getting out what he wants to say.  He was not slurring his speech.  It took a while for him to process what I was saying.  Objective: Vitals:   05/22/19 0420 05/22/19 0844  BP: (!) 138/92 (!) 139/95  Pulse: 70 63  Resp:  18  Temp: 97.7 F (36.5 C) 97.7 F (36.5 C)  SpO2: 99% 99%    Intake/Output Summary (Last 24 hours) at 05/22/2019 1549 Last data filed at 05/22/2019 1300 Gross per 24 hour  Intake 240 ml  Output 350 ml  Net -110 ml   Filed Weights   05/19/19 1500 05/21/19 0410 05/22/19 0420  Weight: 73.2 kg 74.1 kg 73.8 kg    ROS: Review of Systems  Constitutional: Negative for chills and fever.  Eyes: Negative for blurred vision.  Respiratory: Negative for cough and shortness of breath.   Cardiovascular: Negative for chest pain.  Gastrointestinal: Negative for abdominal pain, constipation, diarrhea, nausea and vomiting.  Genitourinary: Negative for dysuria.  Musculoskeletal: Negative for joint pain.  Neurological: Negative for dizziness and headaches.   Exam: Physical Exam  Constitutional: He is oriented to person, place, and time.  HENT:  Nose: No mucosal edema.  Mouth/Throat: No oropharyngeal exudate or posterior oropharyngeal edema.  Eyes: Pupils are equal, round, and reactive to light. Conjunctivae, EOM and lids are normal.  Neck: No JVD present. Carotid bruit is not present. No thyroid mass and no thyromegaly present.  Cardiovascular: S1 normal and S2 normal. Exam reveals no gallop.  No murmur heard. Pulses:      Dorsalis pedis pulses are 2+ on the right side and 2+ on the left side.  Respiratory: No respiratory distress. He has no wheezes. He has no rhonchi. He has no rales.  GI: Soft. Bowel sounds are  normal. There is no abdominal tenderness.  Musculoskeletal:     Cervical back: No edema.     Right ankle: No swelling.     Left ankle: No swelling.  Lymphadenopathy:    He has no cervical adenopathy.  Neurological: He is alert and oriented to person, place, and time. No cranial nerve deficit.  Power 5 out of 5 upper and lower extremities but slow with his responses of what I was asking him to do.  Skin: Skin is warm. No rash noted. Nails show no clubbing.  Psychiatric:  Slow with some responses.      Data Reviewed: Basic Metabolic Panel: Recent Labs  Lab 05/19/19 1026 05/20/19 0408 05/21/19 0427  NA 135 135 137  K 3.2* 4.2 3.8  CL 104 107 108  CO2 16* 19* 18*  GLUCOSE 155* 107* 89  BUN 32* 32* 30*  CREATININE 1.78* 1.95* 2.01*  CALCIUM 8.5* 8.2* 8.1*   Liver Function Tests: Recent Labs  Lab 05/19/19 1026 05/20/19 0408  AST 21 22  ALT 16 18  ALKPHOS 76 67  BILITOT 1.0 1.0  PROT 6.5 5.9*  ALBUMIN 2.9* 2.6*   CBC: Recent Labs  Lab 05/19/19 1026 05/20/19 0408 05/21/19 0425  WBC 11.7* 11.2* 10.2  HGB 13.3 12.2* 12.0*  HCT 38.3* 35.8* 36.4*  MCV 86.8 88.2 91.5  PLT 304 282 278    CBG: Recent Labs  Lab 05/19/19 1443  GLUCAP 100*    Recent Results (from  the past 240 hour(s))  SARS Coronavirus 2 by RT PCR (hospital order, performed in [facility] hospital lab) Nasopharyngeal Nasopharyngeal Swab     Status: None   Collection Time: 05/19/19 12:40 PM   Specimen: Nasopharyngeal Swab  Result Value Ref Range Status   SARS Coronavirus 2 NEGATIVE NEGATIVE Final    Comment: (NOTE) SARS-CoV-2 target nucleic acids are NOT DETECTED. The SARS-CoV-2 RNA is generally detectable in upper and lower respiratory specimens during the acute phase of infection. The lowest concentration of SARS-CoV-2 viral copies this assay can detect is 250 copies / mL. A negative result does not preclude SARS-CoV-2 infection and should not be used as the sole basis for treatment or  other patient management decisions.  A negative result may occur with improper specimen collection / handling, submission of specimen other than nasopharyngeal swab, presence of viral mutation(s) within the areas targeted by this assay, and inadequate number of viral copies (<250 copies / mL). A negative result must be combined with clinical observations, patient history, and epidemiological information. Fact Sheet for Patients:   StrictlyIdeas.no Fact Sheet for Healthcare Providers: BankingDealers.co.za This test is not yet approved or cleared  by the [place] FDA and has been authorized for detection and/or diagnosis of SARS-CoV-2 by FDA under an Emergency Use Authorization (EUA).  This EUA will remain in effect (meaning this test can be used) for the duration of the COVID-19 declaration under Section 564(b)(1) of the Act, 21 U.S.C. section 360bbb-3(b)(1), unless the authorization is terminated or revoked sooner. Performed at [facility], [address] [place], [place] [address]   MRSA PCR Screening     Status: None   Collection Time: 05/19/19  3:57 PM   Specimen: Nasal Mucosa; Nasopharyngeal  Result Value Ref Range Status   MRSA by PCR NEGATIVE NEGATIVE Final    Comment:        The GeneXpert MRSA Assay (FDA approved for NASAL specimens only), is one component of a comprehensive MRSA colonization surveillance program. It is not intended to diagnose MRSA infection nor to guide or monitor treatment for MRSA infections. Performed at [facility], [address] [place], [place] [address]      Studies: MR BRAIN WO CONTRAST  Result Date: 05/21/2019 CLINICAL DATA:  Focal neuro deficit, greater than 6 hours, stroke suspected. EXAM: MRI HEAD WITHOUT CONTRAST TECHNIQUE: Multiplanar, multiecho pulse sequences of the brain and surrounding structures were obtained without intravenous contrast. COMPARISON:   Head CT 05/19/2019 FINDINGS: Brain: Mild-to-moderate motion degradation of multiple sequences. There is a moderate-sized acute cortical/subcortical infarct within the left MCA vascular territory measuring 5.1 x 3.3 x 4.4 cm (AP x TV x CC). The infarct involves the left insula, a small portion of the superior left temporal lobe and the left parietal lobe, including the postcentral gyrus. Corresponding T2/FLAIR hyperintensity at this site. Small amount of petechial hemorrhage within the posterior left insula. There is a background of moderate chronic small vessel ischemic disease. Small chronic lacunar infarct within the left thalamus. No midline shift. No intracranial mass. No extra-axial fluid collection. Mild generalized parenchymal atrophy. Vascular: Flow voids maintained within the proximal large arterial vessels. Skull and upper cervical spine: No focal marrow lesion identified on motion degraded imaging. Sinuses/Orbits: Visualized orbits demonstrate no acute abnormality. Mild left sphenoid sinus mucosal thickening. No significant mastoid effusion. These results were called by telephone at the time of interpretation on 05/21/2019 at 1:09 pm to provider [facility] , who verbally acknowledged these results. IMPRESSION:  1. Motion degraded examination. 2. Moderate-sized acute cortical/subcortical infarct within the left MCA vascular territory as detailed. Small amount of petechial hemorrhage within the posterior left insula. 3. Background of moderate chronic small vessel ischemic disease. Tiny chronic left thalamic lacunar infarct. Electronically Signed   By: Kellie Simmering DO   On: 05/21/2019 13:09   US Carotid Bilateral  Result Date: 05/21/2019 CLINICAL DATA:  63 year old male with a history stroke EXAM: BILATERAL CAROTID DUPLEX ULTRASOUND TECHNIQUE: Pearline Cables scale imaging, color Doppler and duplex ultrasound were performed of bilateral carotid and vertebral arteries in the neck. COMPARISON:  None. FINDINGS:  Criteria: Quantification of carotid stenosis is based on velocity parameters that correlate the residual internal carotid diameter with NASCET-based stenosis levels, using the diameter of the distal internal carotid lumen as the denominator for stenosis measurement. The following velocity measurements were obtained: RIGHT ICA:  Systolic 80 cm/sec, Diastolic 25 cm/sec CCA:  86 cm/sec SYSTOLIC ICA/CCA RATIO:  0.9 ECA:  92 cm/sec LEFT ICA:  Systolic 74 cm/sec, Diastolic 21 cm/sec CCA:  123XX123 cm/sec SYSTOLIC ICA/CCA RATIO:  0.7 ECA:  123 cm/sec Right Brachial SBP: Not acquired Left Brachial SBP: Not acquired RIGHT CAROTID ARTERY: No significant calcifications of the right common carotid artery. Intermediate waveform maintained. Heterogeneous and partially calcified plaque at the right carotid bifurcation. No significant lumen shadowing. Low resistance waveform of the right ICA. No significant tortuosity. RIGHT VERTEBRAL ARTERY: Antegrade flow with low resistance waveform. LEFT CAROTID ARTERY: No significant calcifications of the left common carotid artery. Intermediate waveform maintained. Heterogeneous and partially calcified plaque at the left carotid bifurcation without significant lumen shadowing. Low resistance waveform of the left ICA. No significant tortuosity. LEFT VERTEBRAL ARTERY: Antegrade flow. Late reversal of the systolic waveform with antegrade diastole. IMPRESSION: Color duplex indicates minimal heterogeneous and calcified plaque, with no hemodynamically significant stenosis by duplex criteria in the extracranial cerebrovascular circulation. Waveform of the left vertebral artery indicates developing proximal left subclavian artery stenosis. Correlation with bilateral upper extremity blood pressure is recommended. Signed, Dulcy Fanny. Dellia Nims, RPVI [facility] Inter[facility] Radiology Electronically Signed   By: Corrie Mckusick D.O.   On: 05/21/2019 12:39    Scheduled  Meds: . aspirin  81 mg Oral Daily  . atorvastatin  40 mg Oral q1800  . carvedilol  12.5 mg Oral BID WC  . Chlorhexidine Gluconate Cloth  6 each Topical Daily  . clopidogrel  75 mg Oral Daily  . feeding supplement (ENSURE ENLIVE)  237 mL Oral BID BM  . isosorbide-hydrALAZINE  1 tablet Oral TID  . sodium chloride flush  3 mL Intravenous Q12H   Continuous Infusions: . sodium chloride      Assessment/Plan:  1. 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  Patient on aspirin and Plavix.  Patient having difficulty with processing words and saying what he wants to say.  Patient was evaluated by acute rehab and not a candidate.  I am concerned about this patient living alone.  I spoke with the patient's sister on the phone.  Continue atorvastatin. 2. STEMI.  Medical management with aspirin, Plavix, atorvastatin and Coreg. 3. Hyperlipidemia.  LDL 75.  Goal less than 70.  High-dose atorvastatin should achieve this goal. 4. Chronic kidney disease stage IIIa. 5. Paroxysmal atrial fibrillation.  Holding major anticoagulation with DOAC at this time. 6. Cardiomyopathy with chronic systolic congestive heart failure.  No ACE or ARB with kidney injury.  No spironolactone with kidney injury.  On beta-blocker only.  Code Status:     Code Status Orders  (From admission, onward)         Start     Ordered   05/19/19 1459  Full code  Continuous     05/19/19 1459        Code Status History    This patient has a current code status but no historical code status.   Advance Care Planning Activity     Family Communication: Spoke with sister on the phone Disposition Plan: I am concerned about this patient living alone.  Consultants:  Cardiology  Neurology  Time spent: 28 minutes  [place]

## 2019-05-22 NOTE — Progress Notes (Signed)
Progress Note  Patient Name: Gordon [name] Date of Encounter: 05/22/2019  Primary Cardiologist: Kathlyn Sacramento, MD  Subjective   No c/p or sob.  Feels like speech has improved some but says he's got a long way to go.  Inpatient Medications    Scheduled Meds:  aspirin  81 mg Oral Daily   atorvastatin  40 mg Oral q1800   carvedilol  12.5 mg Oral BID WC   Chlorhexidine Gluconate Cloth  6 each Topical Daily   clopidogrel  75 mg Oral Daily   feeding supplement (ENSURE ENLIVE)  237 mL Oral BID BM   isosorbide-hydrALAZINE  1 tablet Oral TID   sodium chloride flush  3 mL Intravenous Q12H   Continuous Infusions:  sodium chloride     PRN Meds: sodium chloride, acetaminophen, ondansetron (ZOFRAN) IV, sodium chloride flush   Vital Signs    Vitals:   05/21/19 1624 05/21/19 1924 05/22/19 0420 05/22/19 0844  BP: 119/74 126/76 (!) 138/92 (!) 139/95  Pulse: 74 72 70 63  Resp: 18   18  Temp: 98.1 F (36.7 C) 97.7 F (36.5 C) 97.7 F (36.5 C) 97.7 F (36.5 C)  TempSrc: Oral Oral Oral Oral  SpO2: (!) 86% 98% 99% 99%  Weight:   73.8 kg   Height:        Intake/Output Summary (Last 24 hours) at 05/22/2019 1332 Last data filed at 05/22/2019 0900 Gross per 24 hour  Intake 0 ml  Output 250 ml  Net -250 ml   Filed Weights   05/19/19 1500 05/21/19 0410 05/22/19 0420  Weight: 73.2 kg 74.1 kg 73.8 kg    Physical Exam   GEN: Well nourished, well developed, in no acute distress.  HEENT: Grossly normal.  Neck: Supple, no JVD, carotid bruits, or masses. Cardiac: RRR, no murmurs, rubs, or gallops. No clubbing, cyanosis, edema.  Radials/DP/PT 2+ and equal bilaterally. R wrist cath site w/o bleeding/bruit/hematoma. R FA ecchymosis noted. Respiratory:  Respirations regular and unlabored, clear to auscultation bilaterally. GI: Soft, nontender, nondistended, BS + x 4. MS: no deformity or atrophy. Skin: warm and dry, no rash. Neuro:  Strength and sensation are intact. Periodic  expressive aphasia. Psych: AAOx3.  Normal affect.  Labs    Chemistry Recent Labs  Lab 05/19/19 1026 05/20/19 0408 05/21/19 0427  NA 135 135 137  K 3.2* 4.2 3.8  CL 104 107 108  CO2 16* 19* 18*  GLUCOSE 155* 107* 89  BUN 32* 32* 30*  CREATININE 1.78* 1.95* 2.01*  CALCIUM 8.5* 8.2* 8.1*  PROT 6.5 5.9*  --   ALBUMIN 2.9* 2.6*  --   AST 21 22  --   ALT 16 18  --   ALKPHOS 76 67  --   BILITOT 1.0 1.0  --   GFRNONAA 39* 35* 34*  GFRAA 46* 41* 39*  ANIONGAP 15 9 11      Hematology Recent Labs  Lab 05/19/19 1026 05/20/19 0408 05/21/19 0425  WBC 11.7* 11.2* 10.2  RBC 4.41 4.06* 3.98*  HGB 13.3 12.2* 12.0*  HCT 38.3* 35.8* 36.4*  MCV 86.8 88.2 91.5  MCH 30.2 30.0 30.2  MCHC 34.7 34.1 33.0  RDW 13.5 13.8 14.1  PLT 304 282 278    Cardiac Enzymes  Recent Labs  Lab 05/19/19 1026 05/19/19 1446 05/19/19 1720  TROPONINIHS 3,929* 3,682* 3,729*       Radiology    MR BRAIN WO CONTRAST  Result Date: 05/21/2019 CLINICAL DATA:  Focal neuro deficit, greater than  6 hours, stroke suspected. EXAM: MRI HEAD WITHOUT CONTRAST TECHNIQUE: Multiplanar, multiecho pulse sequences of the brain and surrounding structures were obtained without intravenous contrast. COMPARISON:  Head CT 05/19/2019 FINDINGS: Brain: Mild-to-moderate motion degradation of multiple sequences. There is a moderate-sized acute cortical/subcortical infarct within the left MCA vascular territory measuring 5.1 x 3.3 x 4.4 cm (AP x TV x CC). The infarct involves the left insula, a small portion of the superior left temporal lobe and the left parietal lobe, including the postcentral gyrus. Corresponding T2/FLAIR hyperintensity at this site. Small amount of petechial hemorrhage within the posterior left insula. There is a background of moderate chronic small vessel ischemic disease. Small chronic lacunar infarct within the left thalamus. No midline shift. No intracranial mass. No extra-axial fluid collection. Mild generalized  parenchymal atrophy. Vascular: Flow voids maintained within the proximal large arterial vessels. Skull and upper cervical spine: No focal marrow lesion identified on motion degraded imaging. Sinuses/Orbits: Visualized orbits demonstrate no acute abnormality. Mild left sphenoid sinus mucosal thickening. No significant mastoid effusion. These results were called by telephone at the time of interpretation on 05/21/2019 at 1:09 pm to provider [facility] , who verbally acknowledged these results. IMPRESSION: 1. Motion degraded examination. 2. Moderate-sized acute cortical/subcortical infarct within the left MCA vascular territory as detailed. Small amount of petechial hemorrhage within the posterior left insula. 3. Background of moderate chronic small vessel ischemic disease. Tiny chronic left thalamic lacunar infarct. Electronically Signed   By: Kellie Simmering DO   On: 05/21/2019 13:09   US Carotid Bilateral  Result Date: 05/21/2019 CLINICAL DATA:  63 year old male with a history stroke EXAM: BILATERAL CAROTID DUPLEX ULTRASOUND TECHNIQUE: Pearline Cables scale imaging, color Doppler and duplex ultrasound were performed of bilateral carotid and vertebral arteries in the neck. COMPARISON:  None. FINDINGS: Criteria: Quantification of carotid stenosis is based on velocity parameters that correlate the residual internal carotid diameter with NASCET-based stenosis levels, using the diameter of the distal internal carotid lumen as the denominator for stenosis measurement. The following velocity measurements were obtained: RIGHT ICA:  Systolic 80 cm/sec, Diastolic 25 cm/sec CCA:  86 cm/sec SYSTOLIC ICA/CCA RATIO:  0.9 ECA:  92 cm/sec LEFT ICA:  Systolic 74 cm/sec, Diastolic 21 cm/sec CCA:  123XX123 cm/sec SYSTOLIC ICA/CCA RATIO:  0.7 ECA:  123 cm/sec Right Brachial SBP: Not acquired Left Brachial SBP: Not acquired RIGHT CAROTID ARTERY: No significant calcifications of the right common carotid artery. Intermediate waveform maintained.  Heterogeneous and partially calcified plaque at the right carotid bifurcation. No significant lumen shadowing. Low resistance waveform of the right ICA. No significant tortuosity. RIGHT VERTEBRAL ARTERY: Antegrade flow with low resistance waveform. LEFT CAROTID ARTERY: No significant calcifications of the left common carotid artery. Intermediate waveform maintained. Heterogeneous and partially calcified plaque at the left carotid bifurcation without significant lumen shadowing. Low resistance waveform of the left ICA. No significant tortuosity. LEFT VERTEBRAL ARTERY: Antegrade flow. Late reversal of the systolic waveform with antegrade diastole. IMPRESSION: Color duplex indicates minimal heterogeneous and calcified plaque, with no hemodynamically significant stenosis by duplex criteria in the extracranial cerebrovascular circulation. Waveform of the left vertebral artery indicates developing proximal left subclavian artery stenosis. Correlation with bilateral upper extremity blood pressure is recommended. Signed, Dulcy Fanny. Dellia Nims, RPVI Vascular and Interventional Radiology Specialists [facility] Radiology Electronically Signed   By: Corrie Mckusick D.O.   On: 05/21/2019 12:39    Telemetry    RSR, 70's - Personally Reviewed  Cardiac Studies   Cardiac Catheterization and Percutaneous Coronary Intervention  12.7.2020  Left Anterior Descending  Ost LAD to Prox LAD lesion 30% stenosed  Ost LAD to Prox LAD lesion is 30% stenosed.  Prox LAD to Mid LAD lesion 30% stenosed  Prox LAD to Mid LAD lesion is 30% stenosed.  Mid LAD to Dist LAD lesion 60% stenosed  Mid LAD to Dist LAD lesion is 60% stenosed.  Left Circumflex  There is mild diffuse disease throughout the vessel.  Left Posterior Atrioventricular Artery  The vessel exhibits minimal luminal irregularities.  Right Coronary Artery  Prox RCA to Mid RCA lesion 100% stenosed  Prox RCA to Mid RCA lesion is 100% stenosed. Vessel is the culprit lesion.  The lesion is type C and thrombotic with left-to-right collateral flow. The lesion is severely calcified.  Right Posterior Descending Artery  Collaterals  RPDA filled by collaterals from Dist LAD.    RPDA lesion 90% stenosed  RPDA lesion is 90% stenosed.  EF of 30 to 35% with akinesis of the inferior wall.   **Unsuccessful balloon angioplasty of the right coronary artery given diffuse disease, calcifications and thrombus. This was in spite of multiple balloon inflations throughout the whole vessel.  _____________   2D Echocardiogram    1. Left ventricular ejection fraction, by visual estimation, is 30 to 35%. The left ventricle has moderate to severely decreased function. There is no left ventricular hypertrophy.  2. Left ventricular diastolic parameters are consistent with Grade II diastolic dysfunction (pseudonormalization).  3. There is severe hypokinesis of the inferior and inferoseptal walls.  4. There is septal "bounce" suggestive of increased intrapericardial pressure or constriction.  5. Global right ventricle has moderately reduced systolic function.The right ventricular size is mildly enlarged. No increase in right ventricular wall thickness.  6. Left atrial size was normal.  7. Right atrial size was mildly dilated.  8. Small pericardial effusion.  9. Mild to moderate mitral annular calcification. 10. The mitral valve is degenerative. Mild mitral valve regurgitation. No evidence of mitral stenosis. 11. The tricuspid valve is not well visualized. Tricuspid valve regurgitation mild-moderate. 12. The aortic valve is tricuspid. Aortic valve regurgitation is not visualized. Mild to moderate aortic valve sclerosis/calcification without any evidence of aortic stenosis. 13. The pulmonic valve was not well visualized. Pulmonic valve regurgitation is not visualized. 14. Mildly dilated pulmonary artery. 15. Mildly elevated pulmonary artery systolic pressure. 16. The inferior vena cava is  dilated in size with <50% respiratory variability, suggesting right atrial pressure of 15 mmHg. _____________   Patient Profile     63 y.o. male with a history of hypertension, tobacco use, and admitted with Afib RVR, late presenting inferior ST elevation MI s/p unsuccessful PCI to RCA, ICM, AKI (? Chronicity of renal dzs), and L MCA territory stroke.  Assessment & Plan    1.  Acute inferior STEMI/CAD:  S/p emergent cath revealing severe RCA dzs.  PCI attempted and unsuccessful in the setting of diffuse dzs  medical therapy.  EF 30-35% w/ inferior HK.  Peak HsTrop 3729.  No c/p or dyspnea overnight.  Maintaining sinus rhythm.  Cont asa, statin,  blocker, plavix.  Will plan to transition to plavix and DOAC (w/o ASA), once cleared by neuro.  Will benefit from cardiac rehab.  2.  Acute L MCA territory stroke:  Seen by neuro w/ rec for f/u CT head today to r/o hemorrhage.  Pt w/ intermittent expressive aphasia but otw stable.  As above, will plan on DOAC once cleared by neuro, in setting of PAF on arrival.  3. PAF:  Noted on admission.  Currently maintaining sinus on  blocker therapy.  CHA2DS2VASc = 6.  As above  DOAC when cleared by neuro.  4.  ICM/HFrEF:  EF 30-35% w/ inf wma's.  Volume stable.  Cont  blocker and bidil.  BP trended nl yesterday, sl higher this AM.  Follow and consider titrating  blocker further if elevated trends persist.  No ACEI/ARB/ARNI/MRA 2/2 renal failure.  Will need CHF education and outpt f/u in CHF clinic.  5.  AKI:  Creat on admission elevated @ 1.78, but has since risen to 2.01.  ? Chronicity of renal dzs +/- role of contrast nephropathy.  Cont to avoid nephrotoxic agents.  Follow.  6.  Tob abuse:  Says he quit prior to admission.  I encouraged him to remain off of cigarettes.  Signed, Murray Hodgkins, NP  05/22/2019, 1:32 PM    For questions or updates, please contact   Please consult [email or web address] for contact info under Cardiology/STEMI.

## 2019-05-22 NOTE — Evaluation (Signed)
Occupational Therapy Evaluation Patient Details Name: Gordon [name] MRN: [id number] DOB: 1956-01-08 Today's Date: 05/22/2019    History of Present Illness Gordon [name] is a 80yoM who comes to [facility] 12/7 c CP and gen weakness, notable HTN and AFcRVR, pt admitted c STEMI, HeadCT negaitve. Pt underwent a cardiac catheterization on 12/7 status post unsuccessful balloon angioplasty of the RCA due to diffuse disease, calcification and thrombus. Pt having expressive difficulty and motor control impairment of the right hemibody. Head CT/MRI showing acute CVA. PTA pt was fully independent, retired but looking ino to working PT in the future.   Clinical Impression   Gordon [name]" Erhard was seen for OT evaluation this date. Prior to hospital admission, pt was active and independent in all ADL/IADL tasks. He reports being retired, but had recently begun to look into obtaining part-time work PTA. Pt lives alone in a 1 level mobile home with a level entry. Currently pt demonstrates significant impairments in RUE/RLE strength, coordination, and sensation. Pt reports he is R hand dominant. He requires min assist for functional transfers and minimal to moderate assist for seated ADL tasks this date. He is noted to have significant motor planning difficulties along with expressive language challenges, decreased right-left differentiation as well as decreased cognition this date. Pt is eager to return to his PLOF with increased independence and functional use of his R arm and leg. Pt educated on safe positioning of RUE in order to promote functional return, improve safety, and promote skin integrity on this date. Pt was motivated t/o OT session and return demonstrated understanding of compensatory self-feeding strategies with VCs for technique. Pt would benefit from skilled OT to address noted impairments and functional limitations (see below for any additional details) in order to maximize safety and independence while  minimizing falls risk and caregiver burden. Upon hospital discharge, recommend pt discharge to CIR for acute inpatient rehabilitation to maximize safety and return to PLOF.     Follow Up Recommendations  CIR    Equipment Recommendations  3 in 1 bedside commode    Recommendations for Other Services       Precautions / Restrictions Precautions Precautions: Fall Restrictions Weight Bearing Restrictions: No      Mobility Bed Mobility Overal bed mobility: Modified Independent             General bed mobility comments: Pt comes to sitting EOB with decreased awareness of feet crossing through catheter tubing. Requires physical prompting in order to move feet appropriately to bring feet to floor over tubing.  Transfers Overall transfer level: Needs assistance Equipment used: Rolling walker (2 wheeled) Transfers: Sit to/from Stand Sit to Stand: Min assist         General transfer comment: Pt mildly impulsive with movements this date, but this may be due to decreased motor planning and overshooting movements. Attempts to immediately come to standing with poor safety awareness.    Balance Overall balance assessment: Modified Independent;Needs assistance Sitting-balance support: Feet supported Sitting balance-Leahy Scale: Good Sitting balance - Comments: Pt steady static and dynamic sitting EOB during functional tasks. No UE support this date.   Standing balance support: Single extremity supported;During functional activity Standing balance-Leahy Scale: Fair Standing balance comment: Pt requires BUE support on RW. CGA-min A provided during short amb to chair to maintain balance.                           ADL either performed or assessed with clinical  judgement   ADL Overall ADL's : Needs assistance/impaired Eating/Feeding: Set up;Supervision/ safety;Minimal assistance;Sitting;Cueing for compensatory techinques;With adaptive utensils Eating/Feeding Details  (indicate cue type and reason): Pt requires min A for cutting items on meal tray and occasional HOH assistance to bring food to mouth. OT educated pt on compensatory eating strategies including 1-handed cutting and provided red foam built-up handle this date. Pt expressed improved satisfaction with self-feeding ability with use of built-up handle, but continues to be functionally limited by decreased fine motor coordination and poor motor planning.                                   General ADL Comments: Pt functionally limited by R sided weakness, difficulty with R/L differentiation, and decreased motor planning. Requires CGA-min assist for functional mobility. Min assist for self-feeding. Likely minimal to moderate assist for more complex motor tasks such as bathing and dressing.     Vision Baseline Vision/History: Wears glasses Wears Glasses: Reading only Vision Assessment?: Yes Eye Alignment: Within Functional Limits Ocular Range of Motion: Restricted looking up Tracking/Visual Pursuits: Decreased smoothness of vertical tracking;Requires cues, head turns, or add eye shifts to track;Impaired - to be further tested in functional context Saccades: Additional eye shifts occurred during testing;Additional head turns occurred during testing Convergence: Impaired - to be further tested in functional context Visual Fields: No apparent deficits     Perception     Praxis      Pertinent Vitals/Pain Pain Assessment: No/denies pain     Hand Dominance Right   Extremity/Trunk Assessment Upper Extremity Assessment Upper Extremity Assessment: RUE deficits/detail(Decreased FMC in BUE with RUE>LUE. Unable to isolate index on either hand for finger to nose. Decreased opposition/coordination.) RUE Sensation: decreased proprioception;decreased light touch(Pt unable to identify light touch on RUE/RLE. At times senses touch but is unable to localize.) RUE Coordination: decreased fine  motor;decreased gross motor(decreased segmental differentiation of the entire limb; delayed inititation with eventual large amplitude explosive movements; Pt sometimes has difficulty differentiating RUE from RLE or differentiaitng RUE from LUE)   Lower Extremity Assessment Lower Extremity Assessment: RLE deficits/detail RLE Sensation: decreased proprioception;decreased light touch RLE Coordination: decreased gross motor;decreased fine motor   Cervical / Trunk Assessment Cervical / Trunk Assessment: Normal   Communication Communication Communication: Expressive difficulties   Cognition Arousal/Alertness: Awake/alert Behavior During Therapy: WFL for tasks assessed/performed Overall Cognitive Status: Impaired/Different from baseline Area of Impairment: Safety/judgement;Following commands;Problem solving;Awareness                       Following Commands: Follows one step commands with increased time;Follows multi-step commands inconsistently Safety/Judgement: Decreased awareness of deficits;Decreased awareness of safety Awareness: Emergent Problem Solving: Difficulty sequencing;Requires tactile cues General Comments: Pt requires consistent multimodal cueing in order to complete functional tasks. Becomes easily frustrated by functional deficits but eager to maintain independence as much as possible. At times exhibits poor safety awareness particularly with functional transfers.   General Comments       Exercises Other Exercises Other Exercises: OT provides education on safe use of AE for functional mobility, falls prevention strategies, compensatory self-feeding strategies, safe positioning and continued use of RUE to promote functional return and skin integrity. Other Exercises: Ot assists pt with breakfast tray and educates pt on safe use of built-up handle for self-feeding.   Shoulder Instructions      Home Living Family/patient expects to be discharged to:: Private  residence  Living Arrangements: Alone Available Help at Discharge: Family;Available PRN/intermittently(Sister) Type of Home: Mobile home Home Access: Level entry     Home Layout: One level     Bathroom Shower/Tub: Occupational psychologist: Standard     Home Equipment: None          Prior Functioning/Environment Level of Independence: Independent        Comments: no history of falls or balance issues;        OT Problem List: Decreased strength;Decreased activity tolerance;Decreased safety awareness;Decreased cognition;Impaired sensation;Impaired UE functional use;Impaired balance (sitting and/or standing);Decreased knowledge of use of DME or AE;Decreased coordination;Decreased range of motion      OT Treatment/Interventions: Self-care/ADL training;Therapeutic exercise;Therapeutic activities;Neuromuscular education;Cognitive remediation/compensation;DME and/or AE instruction;Patient/family education;Manual therapy;Balance training;Visual/perceptual remediation/compensation    OT Goals(Current goals can be found in the care plan section) Acute Rehab OT Goals Patient Stated Goal: To regain my independence OT Goal Formulation: With patient Time For Goal Achievement: 06/05/19 Potential to Achieve Goals: Good ADL Goals Pt Will Perform Eating: with modified independence;with adaptive utensils(With LRAD PRN for improved safety and functional independence.) Pt Will Perform Grooming: with set-up;with supervision;with adaptive equipment;sitting;standing(With LRAD PRN for improved safety and functional independence.) Pt Will Transfer to Toilet: ambulating;bedside commode;regular height toilet(With LRAD PRN for improved safety and functional independence.) Pt Will Perform Toileting - Clothing Manipulation and hygiene: with adaptive equipment;sit to/from stand;with min assist  OT Frequency: Min 3X/week   Barriers to D/C: Inaccessible home environment           Co-evaluation              AM-PAC OT "6 Clicks" Daily Activity     Outcome Measure Help from another person eating meals?: A Little Help from another person taking care of personal grooming?: A Little Help from another person toileting, which includes using toliet, bedpan, or urinal?: A Little Help from another person bathing (including washing, rinsing, drying)?: A Lot Help from another person to put on and taking off regular upper body clothing?: A Little Help from another person to put on and taking off regular lower body clothing?: A Lot 6 Click Score: 16   End of Session Equipment Utilized During Treatment: Gait belt;Rolling walker  Activity Tolerance: Patient tolerated treatment well Patient left: in chair;with call bell/phone within reach;with chair alarm set  OT Visit Diagnosis: Other abnormalities of gait and mobility (R26.89);Hemiplegia and hemiparesis Hemiplegia - Right/Left: Right Hemiplegia - dominant/non-dominant: Dominant Hemiplegia - caused by: Cerebral infarction                [id number] OT Time Calculation (min): 50 min Charges:  OT General Charges $OT Visit: 1 Visit OT Evaluation $OT Eval Moderate Complexity: 1 Mod OT Treatments $Self Care/Home Management : 38-52 mins  Shara Blazing, M.S., OTR/L Ascom: [phone] 05/22/19, 10:40 AM

## 2019-05-23 ENCOUNTER — Inpatient Hospital Stay: Payer: Medicaid Other

## 2019-05-23 DIAGNOSIS — R4701 Aphasia: Secondary | ICD-10-CM

## 2019-05-23 DIAGNOSIS — N1831 Chronic kidney disease, stage 3a: Secondary | ICD-10-CM

## 2019-05-23 DIAGNOSIS — N179 Acute kidney failure, unspecified: Secondary | ICD-10-CM

## 2019-05-23 DIAGNOSIS — I480 Paroxysmal atrial fibrillation: Secondary | ICD-10-CM

## 2019-05-23 DIAGNOSIS — N1832 Chronic kidney disease, stage 3b: Secondary | ICD-10-CM

## 2019-05-23 LAB — BASIC METABOLIC PANEL
Anion gap: 8 (ref 5–15)
BUN: 28 mg/dL — ABNORMAL HIGH (ref 8–23)
CO2: 20 mmol/L — ABNORMAL LOW (ref 22–32)
Calcium: 8.5 mg/dL — ABNORMAL LOW (ref 8.9–10.3)
Chloride: 109 mmol/L (ref 98–111)
Creatinine, Ser: 1.94 mg/dL — ABNORMAL HIGH (ref 0.61–1.24)
GFR calc Af Amer: 41 mL/min — ABNORMAL LOW (ref >60)
GFR calc non Af Amer: 36 mL/min — ABNORMAL LOW (ref >60)
Glucose, Bld: 111 mg/dL — ABNORMAL HIGH (ref 70–99)
Potassium: 4.2 mmol/L (ref 3.5–5.1)
Sodium: 137 mmol/L (ref 135–145)

## 2019-05-23 MED ORDER — APIXABAN 5 MG PO TABS
5.0000 mg | ORAL_TABLET | Freq: Two times a day (BID) | ORAL | Status: DC
  Administered 2019-05-25 – 2019-06-03 (×20): 5 mg via ORAL
  Filled 2019-05-23 (×9): qty 1
  Filled 2019-05-23: qty 2
  Filled 2019-05-23 (×10): qty 1

## 2019-05-23 MED ORDER — CARVEDILOL 6.25 MG PO TABS
6.2500 mg | ORAL_TABLET | Freq: Two times a day (BID) | ORAL | Status: DC
  Administered 2019-05-23 – 2019-05-24 (×2): 6.25 mg via ORAL
  Filled 2019-05-23 (×2): qty 1

## 2019-05-23 MED ORDER — ISOSORBIDE MONONITRATE ER 60 MG PO TB24
60.0000 mg | ORAL_TABLET | Freq: Every day | ORAL | Status: DC
  Administered 2019-05-24 – 2019-06-03 (×11): 60 mg via ORAL
  Filled 2019-05-23 (×11): qty 1

## 2019-05-23 MED ORDER — HYDRALAZINE HCL 25 MG PO TABS
25.0000 mg | ORAL_TABLET | Freq: Three times a day (TID) | ORAL | Status: DC
  Administered 2019-05-23 – 2019-06-03 (×35): 25 mg via ORAL
  Filled 2019-05-23 (×35): qty 1

## 2019-05-23 NOTE — Consult Note (Signed)
Subjective:  No complaints this AM. RUE appears stronger.  S/p discussion with family at bedside.    Past Medical History:  Diagnosis Date  . Hypertension     Past Surgical History:  Procedure Laterality Date  . CORONARY BALLOON ANGIOPLASTY N/A 05/19/2019   Procedure: CORONARY BALLOON ANGIOPLASTY;  Surgeon: Wellington Hampshire, MD;  Location: [facility];  Service: Cardiovascular;  Laterality: N/A;  . CORONARY/GRAFT ACUTE MI REVASCULARIZATION N/A 05/19/2019   Procedure: Coronary/Graft Acute MI Revascularization;  Surgeon: Wellington Hampshire, MD;  Location: [facility];  Service: Cardiovascular;  Laterality: N/A;  . LEFT HEART CATH AND CORONARY ANGIOGRAPHY N/A 05/19/2019   Procedure: LEFT HEART CATH AND CORONARY ANGIOGRAPHY;  Surgeon: Wellington Hampshire, MD;  Location: [facility];  Service: Cardiovascular;  Laterality: N/A;    History reviewed. No pertinent family history.  Social History:  reports that he has been smoking cigarettes. He has been smoking about 0.50 packs per day. He uses smokeless tobacco. No history on file for alcohol and drug.  No Known Allergies  Medications: I have reviewed the patient's current medications.   Physical Examination: Blood pressure 130/80, pulse 70, temperature 98.2 F (36.8 C), temperature source Oral, resp. rate 16, height 5\' 8"  (1.727 m), weight 75 kg, SpO2 100 %.   Neurological Examination   Mental Status: Alert, oriented to name and location. Periods of expressive aphasia  Cranial Nerves: II: Discs flat bilaterally; Visual fields grossly normal, pupils equal, round, reactive to light and accommodation III,IV, VI: ptosis not present, extra-ocular motions intact bilaterally V,VII: smile symmetric, facial light touch sensation normal bilaterally VIII: hearing normal bilaterally IX,X: gag reflex present XI: bilateral shoulder shrug XII: midline tongue extension Motor: Right : Upper extremity   4+/5    Left:     Upper  extremity   5/5  Lower extremity   4+/5     Lower extremity   5/5 Tone and bulk:normal tone throughout; no atrophy noted Sensory: Pinprick and light touch intact throughout, bilaterally Deep Tendon Reflexes: 1+ and symmetric throughout Plantars: Right: downgoing   Left: downgoing Cerebellar: normal finger-to-nose Gait: not tested      Laboratory Studies:   Basic Metabolic Panel: Recent Labs  Lab 05/19/19 1026 05/20/19 0408 05/21/19 0427 05/23/19 0850  NA 135 135 137 137  K 3.2* 4.2 3.8 4.2  CL 104 107 108 109  CO2 16* 19* 18* 20*  GLUCOSE 155* 107* 89 111*  BUN 32* 32* 30* 28*  CREATININE 1.78* 1.95* 2.01* 1.94*  CALCIUM 8.5* 8.2* 8.1* 8.5*    Liver Function Tests: Recent Labs  Lab 05/19/19 1026 05/20/19 0408  AST 21 22  ALT 16 18  ALKPHOS 76 67  BILITOT 1.0 1.0  PROT 6.5 5.9*  ALBUMIN 2.9* 2.6*   No results for input(s): LIPASE, AMYLASE in the last 168 hours. No results for input(s): AMMONIA in the last 168 hours.  CBC: Recent Labs  Lab 05/19/19 1026 05/20/19 0408 05/21/19 0425  WBC 11.7* 11.2* 10.2  HGB 13.3 12.2* 12.0*  HCT 38.3* 35.8* 36.4*  MCV 86.8 88.2 91.5  PLT 304 282 278    Cardiac Enzymes: No results for input(s): CKTOTAL, CKMB, CKMBINDEX, TROPONINI in the last 168 hours.  BNP: Invalid input(s): POCBNP  CBG: Recent Labs  Lab 05/19/19 1443  GLUCAP 100*    Microbiology: Results for orders placed or performed during the hospital encounter of 05/19/19  SARS Coronavirus 2 by RT PCR (hospital order, performed in  [facility] hospital lab) Nasopharyngeal Nasopharyngeal Swab     Status: None   Collection Time: 05/19/19 12:40 PM   Specimen: Nasopharyngeal Swab  Result Value Ref Range Status   SARS Coronavirus 2 NEGATIVE NEGATIVE Final    Comment: (NOTE) SARS-CoV-2 target nucleic acids are NOT DETECTED. The SARS-CoV-2 RNA is generally detectable in upper and lower respiratory specimens during the acute phase of infection. The  lowest concentration of SARS-CoV-2 viral copies this assay can detect is 250 copies / mL. A negative result does not preclude SARS-CoV-2 infection and should not be used as the sole basis for treatment or other patient management decisions.  A negative result may occur with improper specimen collection / handling, submission of specimen other than nasopharyngeal swab, presence of viral mutation(s) within the areas targeted by this assay, and inadequate number of viral copies (<250 copies / mL). A negative result must be combined with clinical observations, patient history, and epidemiological information. Fact Sheet for Patients:   StrictlyIdeas.no Fact Sheet for Healthcare Providers: BankingDealers.co.za This test is not yet approved or cleared  by the [place] FDA and has been authorized for detection and/or diagnosis of SARS-CoV-2 by FDA under an Emergency Use Authorization (EUA).  This EUA will remain in effect (meaning this test can be used) for the duration of the COVID-19 declaration under Section 564(b)(1) of the Act, 21 U.S.C. section 360bbb-3(b)(1), unless the authorization is terminated or revoked sooner. Performed at [facility], [address] [place], [place] [address]   MRSA PCR Screening     Status: None   Collection Time: 05/19/19  3:57 PM   Specimen: Nasal Mucosa; Nasopharyngeal  Result Value Ref Range Status   MRSA by PCR NEGATIVE NEGATIVE Final    Comment:        The GeneXpert MRSA Assay (FDA approved for NASAL specimens only), is one component of a comprehensive MRSA colonization surveillance program. It is not intended to diagnose MRSA infection nor to guide or monitor treatment for MRSA infections. Performed at [facility], [address] [place], [place] [address]     Coagulation Studies: No results for input(s): LABPROT, INR in the last 72 hours.  Urinalysis:  Recent Labs   Lab 05/19/19 1059  COLORURINE YELLOW*  LABSPEC 1.012  PHURINE 5.0  GLUCOSEU NEGATIVE  HGBUR NEGATIVE  BILIRUBINUR NEGATIVE  KETONESUR NEGATIVE  PROTEINUR NEGATIVE  NITRITE NEGATIVE  LEUKOCYTESUR NEGATIVE    Lipid Panel:     Component Value Date/Time   CHOL 108 05/20/2019 0408   TRIG 54 05/20/2019 0408   HDL 22 (L) 05/20/2019 0408   CHOLHDL 4.9 05/20/2019 0408   VLDL 11 05/20/2019 0408   LDLCALC 75 05/20/2019 0408    HgbA1C:  Lab Results  Component Value Date   HGBA1C 6.1 (H) 05/21/2019    Urine Drug Screen:  No results found for: LABOPIA, COCAINSCRNUR, LABBENZ, AMPHETMU, THCU, LABBARB  Alcohol Level: No results for input(s): ETH in the last 168 hours.  Other results: EKG: A-fib rate controlled .  Imaging: CT HEAD WO CONTRAST  Result Date: 05/23/2019 CLINICAL DATA:  Follow-up stroke EXAM: CT HEAD WITHOUT CONTRAST TECHNIQUE: Contiguous axial images were obtained from the base of the skull through the vertex without intravenous contrast. COMPARISON:  MRI 05/21/2019.  CT 05/19/2019. FINDINGS: Brain: Discrete low-density now evident in the region of acute infarction within the left MCA territory affecting the deep insula and frontoparietal cortical and subcortical brain. Mild swelling but no evidence of mass effect or  hemorrhage. No other acute or subacute infarction identified. Chronic small-vessel ischemic changes affect the cerebral hemispheric white matter elsewhere. Chronic small-vessel changes of the pons. No mass, hydrocephalus or extra-axial collection. Vascular: There is atherosclerotic calcification of the major vessels at the base of the brain. Skull: Negative Sinuses/Orbits: Clear/normal Other: None IMPRESSION: Discrete low-density now evident at CT in the region of acute infarction in the left MCA territory affecting the deep insula and frontoparietal brain. No mass effect or hemorrhagic transformation. Electronically Signed   By: Nelson Chimes M.D.   On: 05/23/2019  10:36   MR BRAIN WO CONTRAST  Result Date: 05/21/2019 CLINICAL DATA:  Focal neuro deficit, greater than 6 hours, stroke suspected. EXAM: MRI HEAD WITHOUT CONTRAST TECHNIQUE: Multiplanar, multiecho pulse sequences of the brain and surrounding structures were obtained without intravenous contrast. COMPARISON:  Head CT 05/19/2019 FINDINGS: Brain: Mild-to-moderate motion degradation of multiple sequences. There is a moderate-sized acute cortical/subcortical infarct within the left MCA vascular territory measuring 5.1 x 3.3 x 4.4 cm (AP x TV x CC). The infarct involves the left insula, a small portion of the superior left temporal lobe and the left parietal lobe, including the postcentral gyrus. Corresponding T2/FLAIR hyperintensity at this site. Small amount of petechial hemorrhage within the posterior left insula. There is a background of moderate chronic small vessel ischemic disease. Small chronic lacunar infarct within the left thalamus. No midline shift. No intracranial mass. No extra-axial fluid collection. Mild generalized parenchymal atrophy. Vascular: Flow voids maintained within the proximal large arterial vessels. Skull and upper cervical spine: No focal marrow lesion identified on motion degraded imaging. Sinuses/Orbits: Visualized orbits demonstrate no acute abnormality. Mild left sphenoid sinus mucosal thickening. No significant mastoid effusion. These results were called by telephone at the time of interpretation on 05/21/2019 at 1:09 pm to provider [facility] , who verbally acknowledged these results. IMPRESSION: 1. Motion degraded examination. 2. Moderate-sized acute cortical/subcortical infarct within the left MCA vascular territory as detailed. Small amount of petechial hemorrhage within the posterior left insula. 3. Background of moderate chronic small vessel ischemic disease. Tiny chronic left thalamic lacunar infarct. Electronically Signed   By: Kellie Simmering DO   On: 05/21/2019 13:09   US  Carotid Bilateral  Result Date: 05/21/2019 CLINICAL DATA:  63 year old male with a history stroke EXAM: BILATERAL CAROTID DUPLEX ULTRASOUND TECHNIQUE: Pearline Cables scale imaging, color Doppler and duplex ultrasound were performed of bilateral carotid and vertebral arteries in the neck. COMPARISON:  None. FINDINGS: Criteria: Quantification of carotid stenosis is based on velocity parameters that correlate the residual internal carotid diameter with NASCET-based stenosis levels, using the diameter of the distal internal carotid lumen as the denominator for stenosis measurement. The following velocity measurements were obtained: RIGHT ICA:  Systolic 80 cm/sec, Diastolic 25 cm/sec CCA:  86 cm/sec SYSTOLIC ICA/CCA RATIO:  0.9 ECA:  92 cm/sec LEFT ICA:  Systolic 74 cm/sec, Diastolic 21 cm/sec CCA:  123XX123 cm/sec SYSTOLIC ICA/CCA RATIO:  0.7 ECA:  123 cm/sec Right Brachial SBP: Not acquired Left Brachial SBP: Not acquired RIGHT CAROTID ARTERY: No significant calcifications of the right common carotid artery. Intermediate waveform maintained. Heterogeneous and partially calcified plaque at the right carotid bifurcation. No significant lumen shadowing. Low resistance waveform of the right ICA. No significant tortuosity. RIGHT VERTEBRAL ARTERY: Antegrade flow with low resistance waveform. LEFT CAROTID ARTERY: No significant calcifications of the left common carotid artery. Intermediate waveform maintained. Heterogeneous and partially calcified plaque at the left carotid bifurcation without significant lumen shadowing. Low resistance waveform  of the left ICA. No significant tortuosity. LEFT VERTEBRAL ARTERY: Antegrade flow. Late reversal of the systolic waveform with antegrade diastole. IMPRESSION: Color duplex indicates minimal heterogeneous and calcified plaque, with no hemodynamically significant stenosis by duplex criteria in the extracranial cerebrovascular circulation. Waveform of the left vertebral artery indicates developing  proximal left subclavian artery stenosis. Correlation with bilateral upper extremity blood pressure is recommended. Signed, Dulcy Fanny. Dellia Nims, RPVI [facility] Radiology Electronically Signed   By: Corrie Mckusick D.O.   On: 05/21/2019 12:39     Assessment/Plan:  63 y.o. male without known past medical history, on no medications as an outpatient and no primary physician admitted to the hospital for substernal chest pian.  He was found to be in A. fib with RVR and there was concern for STEMI.  On admission he also reportedly had intermittent confusion.  He underwent a cardiac catheterization on 12/7 status post unsuccessful balloon angioplasty of the RCA due to diffuse disease, calcification and thrombus.  He was also found to have moderately to severely reduced LV systolic function with an EF of 30-35% with inferior wall akinesis.  Last seen normal yesterday and he was found to have difficulty expressing himself around the time post cath.      - MRI brain with L MCA subcortical ischemia that is fairly decent in size along with some petechial hemorrhage in the area - [facility] reviewed on 12/7 with aspect 10 and no signs of ischemia.  Suspect stroke is about 27 days old prior to imagine  - s/p d/c ASA and appreciate discussion with cardiology and medicine team - Would like to start eliquis day 3 from today which will be Sunday as not comfortable on 3 agents (ASA, Plavix and eliquis)  - Eliquis ordered to start Sunday - CTH done today no acute abnormalities and no signs of hemorrhage.  - d/c planning early next week.   - patient and family agrees regarding treatment.   05/23/2019, 10:42 AM

## 2019-05-23 NOTE — Progress Notes (Signed)
  Speech Language Pathology Treatment: Cognitive-Linquistic  Patient Details Name: Sione Meinholz MRN: [id number] DOB: 1955/11/28 Today's Date: 05/23/2019 Time: [id number] SLP Time Calculation (min) (ACUTE ONLY): 33 min  Assessment / Plan / Recommendation Clinical Impression  Patient demonstrates significant improvement in verbal expression.  He is able to name objects with greater accuracy, implement strategy learned yesterday independently, and use new strategies during the session.  The patient is responsive to strategies to use the starter phrase "this is a ___", gestural cues, pantomime self-cuing, semantic cues, reading carrier phrase, reading written word, and phonemic cues.  Given his promising progress to date and responsiveness to cuing, the patient can be expected to continue to make significant gains in communication with intensive therapy, preferable inpatient rehab setting.     HPI HPI: Pt is a 63yo male who comes to [facility] 12/7  w/ c/o CP and weakness, notable HTN and AFcRVR. Pt endorsed s/s of issues including chest pain since Thanksgiving; code STEMI initiated by MDs. Head CT negaitve. Pt underwent a cardiac catheterization on 12/7 status post unsuccessful balloon angioplasty of the RCA due to diffuse disease, calcification and thrombus. Pt having expressive difficulty and motor control impairment of the right hemibody post precedure. Head CT/MRI following revealed acute CVA. PTA pt was fully independent, retired. Pt verbally engaging and eager to attempt communication but is quite hampered by the expressive deficits. Speech clear.       SLP Plan  Continue with current plan of care       Recommendations                   Follow up Recommendations: Inpatient Rehab SLP Visit Diagnosis: Aphasia (R47.01);Apraxia (R48.2) Plan: Continue with current plan of care       GO               Leroy Sea, MS/CCC- SLP  Lou Miner 05/23/2019, 3:04 PM

## 2019-05-23 NOTE — Plan of Care (Signed)

## 2019-05-23 NOTE — Progress Notes (Signed)
Progress Note  Patient Name: Gordon [name] Date of Encounter: 05/23/2019  Primary Cardiologist: Kathlyn Sacramento, MD  Subjective   Working with physical therapy today, reports continued mild confusion but slowly improving Walked 400 feet without difficulty, with a walker for Denies any shortness of breath or chest pain  Inpatient Medications    Scheduled Meds: . [START ON 05/25/2019] apixaban  5 mg Oral BID  . atorvastatin  40 mg Oral q1800  . carvedilol  6.25 mg Oral BID WC  . Chlorhexidine Gluconate Cloth  6 each Topical Daily  . clopidogrel  75 mg Oral Daily  . feeding supplement (ENSURE ENLIVE)  237 mL Oral BID BM  . isosorbide-hydrALAZINE  1 tablet Oral TID  . sodium chloride flush  3 mL Intravenous Q12H   Continuous Infusions: . sodium chloride     PRN Meds: sodium chloride, acetaminophen, ondansetron (ZOFRAN) IV, sodium chloride flush   Vital Signs    Vitals:   05/22/19 2028 05/23/19 0518 05/23/19 0720 05/23/19 0955  BP: 122/85 (!) 144/95 130/80   Pulse: 72 65 (!) 56 70  Resp: 18 19 16    Temp: 98.2 F (36.8 C) (!) 97.4 F (36.3 C) 98.2 F (36.8 C)   TempSrc: Oral Oral Oral   SpO2: 95% 99% 100%   Weight:  75 kg    Height:        Intake/Output Summary (Last 24 hours) at 05/23/2019 1513 Last data filed at 05/23/2019 1300 Gross per 24 hour  Intake 480 ml  Output 500 ml  Net -20 ml   Filed Weights   05/21/19 0410 05/22/19 0420 05/23/19 0518  Weight: 74.1 kg 73.8 kg 75 kg    Physical Exam   Constitutional:  oriented to person, place, and time. No distress.  HENT:  Head: Grossly normal Eyes:  no discharge. No scleral icterus.  Neck: No JVD, no carotid bruits  Cardiovascular: Regular rate and rhythm, no murmurs appreciated Pulmonary/Chest: Clear to auscultation bilaterally, no wheezes or rails Abdominal: Soft.  no distension.  no tenderness.  Musculoskeletal: Normal range of motion Neurological:  normal muscle tone. Coordination normal. No  atrophy Skin: Skin warm and dry Psychiatric: normal affect, pleasant   Labs    Chemistry Recent Labs  Lab 05/19/19 1026 05/20/19 0408 05/21/19 0427 05/23/19 0850  NA 135 135 137 137  K 3.2* 4.2 3.8 4.2  CL 104 107 108 109  CO2 16* 19* 18* 20*  GLUCOSE 155* 107* 89 111*  BUN 32* 32* 30* 28*  CREATININE 1.78* 1.95* 2.01* 1.94*  CALCIUM 8.5* 8.2* 8.1* 8.5*  PROT 6.5 5.9*  --   --   ALBUMIN 2.9* 2.6*  --   --   AST 21 22  --   --   ALT 16 18  --   --   ALKPHOS 76 67  --   --   BILITOT 1.0 1.0  --   --   GFRNONAA 39* 35* 34* 36*  GFRAA 46* 41* 39* 41*  ANIONGAP 15 9 11 8      Hematology Recent Labs  Lab 05/19/19 1026 05/20/19 0408 05/21/19 0425  WBC 11.7* 11.2* 10.2  RBC 4.41 4.06* 3.98*  HGB 13.3 12.2* 12.0*  HCT 38.3* 35.8* 36.4*  MCV 86.8 88.2 91.5  MCH 30.2 30.0 30.2  MCHC 34.7 34.1 33.0  RDW 13.5 13.8 14.1  PLT 304 282 278    Cardiac Enzymes  Recent Labs  Lab 05/19/19 1026 05/19/19 1446 05/19/19 1720  TROPONINIHS 3,929*  3,682* 3,729*       Radiology    CT HEAD WO CONTRAST  Result Date: 05/23/2019 CLINICAL DATA:  Follow-up stroke EXAM: CT HEAD WITHOUT CONTRAST TECHNIQUE: Contiguous axial images were obtained from the base of the skull through the vertex without intravenous contrast. COMPARISON:  MRI 05/21/2019.  CT 05/19/2019. FINDINGS: Brain: Discrete low-density now evident in the region of acute infarction within the left MCA territory affecting the deep insula and frontoparietal cortical and subcortical brain. Mild swelling but no evidence of mass effect or hemorrhage. No other acute or subacute infarction identified. Chronic small-vessel ischemic changes affect the cerebral hemispheric white matter elsewhere. Chronic small-vessel changes of the pons. No mass, hydrocephalus or extra-axial collection. Vascular: There is atherosclerotic calcification of the major vessels at the base of the brain. Skull: Negative Sinuses/Orbits: Clear/normal Other: None  IMPRESSION: Discrete low-density now evident at CT in the region of acute infarction in the left MCA territory affecting the deep insula and frontoparietal brain. No mass effect or hemorrhagic transformation. Electronically Signed   By: Nelson Chimes M.D.   On: 05/23/2019 10:36    Telemetry    RSR, 70's - Personally Reviewed  Cardiac Studies   Cardiac Catheterization and Percutaneous Coronary Intervention 12.7.2020  Left Anterior Descending  Ost LAD to Prox LAD lesion 30% stenosed  Ost LAD to Prox LAD lesion is 30% stenosed.  Prox LAD to Mid LAD lesion 30% stenosed  Prox LAD to Mid LAD lesion is 30% stenosed.  Mid LAD to Dist LAD lesion 60% stenosed  Mid LAD to Dist LAD lesion is 60% stenosed.  Left Circumflex  There is mild diffuse disease throughout the vessel.  Left Posterior Atrioventricular Artery  The vessel exhibits minimal luminal irregularities.  Right Coronary Artery  Prox RCA to Mid RCA lesion 100% stenosed  Prox RCA to Mid RCA lesion is 100% stenosed. Vessel is the culprit lesion. The lesion is type C and thrombotic with left-to-right collateral flow. The lesion is severely calcified.  Right Posterior Descending Artery  Collaterals  RPDA filled by collaterals from Dist LAD.    RPDA lesion 90% stenosed  RPDA lesion is 90% stenosed.  EF of 30 to 35% with akinesis of the inferior wall.   **Unsuccessful balloon angioplasty of the right coronary artery given diffuse disease, calcifications and thrombus. This was in spite of multiple balloon inflations throughout the whole vessel.  _____________   2D Echocardiogram    1. Left ventricular ejection fraction, by visual estimation, is 30 to 35%. The left ventricle has moderate to severely decreased function. There is no left ventricular hypertrophy.  2. Left ventricular diastolic parameters are consistent with Grade II diastolic dysfunction (pseudonormalization).  3. There is severe hypokinesis of the inferior and  inferoseptal walls.  4. There is septal "bounce" suggestive of increased intrapericardial pressure or constriction.  5. Global right ventricle has moderately reduced systolic function.The right ventricular size is mildly enlarged. No increase in right ventricular wall thickness.  6. Left atrial size was normal.  7. Right atrial size was mildly dilated.  8. Small pericardial effusion.  9. Mild to moderate mitral annular calcification. 10. The mitral valve is degenerative. Mild mitral valve regurgitation. No evidence of mitral stenosis. 11. The tricuspid valve is not well visualized. Tricuspid valve regurgitation mild-moderate. 12. The aortic valve is tricuspid. Aortic valve regurgitation is not visualized. Mild to moderate aortic valve sclerosis/calcification without any evidence of aortic stenosis. 13. The pulmonic valve was not well visualized. Pulmonic valve regurgitation is  not visualized. 14. Mildly dilated pulmonary artery. 15. Mildly elevated pulmonary artery systolic pressure. 16. The inferior vena cava is dilated in size with <50% respiratory variability, suggesting right atrial pressure of 15 mmHg. _____________   Patient Profile     63 y.o. male with a history of hypertension, tobacco use, and admitted with Afib RVR, late presenting inferior ST elevation MI s/p unsuccessful PCI to RCA, ICM, AKI (? Chronicity of renal dzs), and L MCA territory stroke.  Assessment & Plan    1.  Acute inferior STEMI/CAD:   S/p emergent cath revealing severe RCA dzs.   PCI attempted and unsuccessful in the setting of diffuse dzs  medical therapy.  EF 30-35% w/ inferior HK.  Peak HsTrop [id number].   --Continue Plavix, statin, beta-blocker --cardiac rehab. Holding aspirin in preparation for starting a NOAC this Sunday on December 13  2.  Acute L MCA territory stroke:   Followed by neurology, intermittent expressive -Paroxysmal atrial fibrillation, plan to start NOAC in 2 days on December 13  3. PAF:     Noted on admission.   Telemetry reviewed, maintaining sinus rhythm  CHA2DS2VASc = 6.   NOAC this Sunday  4.  ICM/HFrEF:   EF 30-35% w/ inf wma's.    No ACEI/ARB/ARNI/MRA 2/2 renal failure.   -Currently on carvedilol, isosorbide, hydralazine -Given he has no insurance , and BiDil is expensive , we will likely need to split up the isosorbide and hydralazine and 2 separate medications  5.  Acute on chronic renal disease Stable around 1.9 Will need to be followed as outpatient  6.  Tob abuse:   Encouraged to quit smoking   Total encounter time more than 25 minutes  Greater than 50% was spent in counseling and coordination of care with the patient   Signed, Ida Rogue, MD  05/23/2019, 3:13 PM    For questions or updates, please contact   Please consult [email or web address] for contact info under Cardiology/STEMI.

## 2019-05-23 NOTE — Progress Notes (Signed)
Patient ID: Gordon [name], male   DOB: 1955/09/12, 63 y.o.   MRN: [id number] [facility] PROGRESS NOTE  Gordon [name] [id number] DOB: 01-13-1956 DOA: 05/19/2019 PCP: System, Pcp Not In  HPI/Subjective: Patient seems to be understanding and comprehending more today than he was yesterday.  He is still slow with his responses and having difficulty saying what he wants to say.  He feels strong.  Objective: Vitals:   05/23/19 0720 05/23/19 0955  BP: 130/80   Pulse: (!) 56 70  Resp: 16   Temp: 98.2 F (36.8 C)   SpO2: 100%     Intake/Output Summary (Last 24 hours) at 05/23/2019 1334 Last data filed at 05/23/2019 0900 Gross per 24 hour  Intake 240 ml  Output 500 ml  Net -260 ml   Filed Weights   05/21/19 0410 05/22/19 0420 05/23/19 0518  Weight: 74.1 kg 73.8 kg 75 kg    ROS: Review of Systems  Constitutional: Negative for chills and fever.  Eyes: Negative for blurred vision.  Respiratory: Negative for cough and shortness of breath.   Cardiovascular: Negative for chest pain.  Gastrointestinal: Negative for abdominal pain, constipation, diarrhea, nausea and vomiting.  Genitourinary: Negative for dysuria.  Musculoskeletal: Negative for joint pain.  Neurological: Negative for dizziness and headaches.   Exam: Physical Exam  HENT:  Nose: No mucosal edema.  Mouth/Throat: No oropharyngeal exudate or posterior oropharyngeal edema.  Eyes: Pupils are equal, round, and reactive to light. Conjunctivae, EOM and lids are normal.  Neck: No JVD present. Carotid bruit is not present. No thyroid mass and no thyromegaly present.  Cardiovascular: S1 normal and S2 normal. Exam reveals no gallop.  No murmur heard. Pulses:      Dorsalis pedis pulses are 2+ on the right side and 2+ on the left side.  Respiratory: No respiratory distress. He has no wheezes. He has no rhonchi. He has no rales.  GI: Soft. Bowel sounds are normal. There is no abdominal tenderness.  Musculoskeletal:      Cervical back: No edema.     Right ankle: No swelling.     Left ankle: No swelling.  Lymphadenopathy:    He has no cervical adenopathy.  Neurological: He is alert. No cranial nerve deficit.  Power 5 out of 5 upper and lower extremities.  Coordination off with his right hand and arm  Skin: Skin is warm. No rash noted. Nails show no clubbing.  Psychiatric:  Slow with some responses.      Data Reviewed: Basic Metabolic Panel: Recent Labs  Lab 05/19/19 1026 05/20/19 0408 05/21/19 0427 05/23/19 0850  NA 135 135 137 137  K 3.2* 4.2 3.8 4.2  CL 104 107 108 109  CO2 16* 19* 18* 20*  GLUCOSE 155* 107* 89 111*  BUN 32* 32* 30* 28*  CREATININE 1.78* 1.95* 2.01* 1.94*  CALCIUM 8.5* 8.2* 8.1* 8.5*   Liver Function Tests: Recent Labs  Lab 05/19/19 1026 05/20/19 0408  AST 21 22  ALT 16 18  ALKPHOS 76 67  BILITOT 1.0 1.0  PROT 6.5 5.9*  ALBUMIN 2.9* 2.6*   CBC: Recent Labs  Lab 05/19/19 1026 05/20/19 0408 05/21/19 0425  WBC 11.7* 11.2* 10.2  HGB 13.3 12.2* 12.0*  HCT 38.3* 35.8* 36.4*  MCV 86.8 88.2 91.5  PLT 304 282 278    CBG: Recent Labs  Lab 05/19/19 1443  GLUCAP 100*    Recent Results (from the past 240 hour(s))  SARS Coronavirus 2 by RT PCR (hospital order, performed  in [place] lab) Nasopharyngeal Nasopharyngeal Swab     Status: None   Collection Time: 05/19/19 12:40 PM   Specimen: Nasopharyngeal Swab  Result Value Ref Range Status   SARS Coronavirus 2 NEGATIVE NEGATIVE Final    Comment: (NOTE) SARS-CoV-2 target nucleic acids are NOT DETECTED. The SARS-CoV-2 RNA is generally detectable in upper and lower respiratory specimens during the acute phase of infection. The lowest concentration of SARS-CoV-2 viral copies this assay can detect is 250 copies / mL. A negative result does not preclude SARS-CoV-2 infection and should not be used as the sole basis for treatment or other patient management decisions.  A negative result may occur  with improper specimen collection / handling, submission of specimen other than nasopharyngeal swab, presence of viral mutation(s) within the areas targeted by this assay, and inadequate number of viral copies (<250 copies / mL). A negative result must be combined with clinical observations, patient history, and epidemiological information. Fact Sheet for Patients:   StrictlyIdeas.no Fact Sheet for Healthcare Providers: BankingDealers.co.za This test is not yet approved or cleared  by the [place] FDA and has been authorized for detection and/or diagnosis of SARS-CoV-2 by FDA under an Emergency Use Authorization (EUA).  This EUA will remain in effect (meaning this test can be used) for the duration of the COVID-19 declaration under Section 564(b)(1) of the Act, 21 U.S.C. section 360bbb-3(b)(1), unless the authorization is terminated or revoked sooner. Performed at [facility], [address] [place], [place] [address]   MRSA PCR Screening     Status: None   Collection Time: 05/19/19  3:57 PM   Specimen: Nasal Mucosa; Nasopharyngeal  Result Value Ref Range Status   MRSA by PCR NEGATIVE NEGATIVE Final    Comment:        The GeneXpert MRSA Assay (FDA approved for NASAL specimens only), is one component of a comprehensive MRSA colonization surveillance program. It is not intended to diagnose MRSA infection nor to guide or monitor treatment for MRSA infections. Performed at [facility], [address] [place], [place] [address]      Studies: CT HEAD WO CONTRAST  Result Date: 05/23/2019 CLINICAL DATA:  Follow-up stroke EXAM: CT HEAD WITHOUT CONTRAST TECHNIQUE: Contiguous axial images were obtained from the base of the skull through the vertex without intravenous contrast. COMPARISON:  MRI 05/21/2019.  CT 05/19/2019. FINDINGS: Brain: Discrete low-density now evident in the region of acute infarction within  the left MCA territory affecting the deep insula and frontoparietal cortical and subcortical brain. Mild swelling but no evidence of mass effect or hemorrhage. No other acute or subacute infarction identified. Chronic small-vessel ischemic changes affect the cerebral hemispheric white matter elsewhere. Chronic small-vessel changes of the pons. No mass, hydrocephalus or extra-axial collection. Vascular: There is atherosclerotic calcification of the major vessels at the base of the brain. Skull: Negative Sinuses/Orbits: Clear/normal Other: None IMPRESSION: Discrete low-density now evident at CT in the region of acute infarction in the left MCA territory affecting the deep insula and frontoparietal brain. No mass effect or hemorrhagic transformation. Electronically Signed   By: Nelson Chimes M.D.   On: 05/23/2019 10:36    Scheduled Meds: . [START ON 05/25/2019] apixaban  5 mg Oral BID  . atorvastatin  40 mg Oral q1800  . carvedilol  6.25 mg Oral BID WC  . Chlorhexidine Gluconate Cloth  6 each Topical Daily  . clopidogrel  75 mg Oral Daily  . feeding supplement (ENSURE ENLIVE)  237  mL Oral BID BM  . isosorbide-hydrALAZINE  1 tablet Oral TID  . sodium chloride flush  3 mL Intravenous Q12H   Continuous Infusions: . sodium chloride      Assessment/Plan:  1. 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  Repeat CT scan today did not show any hemorrhage.  Continue Plavix as per cardiology.  Stop aspirin today with the goal of starting Eliquis on Sunday.  Patient having difficulty with processing words and saying what he wants to say.  Patient also has incoordination of the right arm.  Patient was evaluated by acute rehab and not a candidate.  I am concerned about this patient living alone.  Patient able to understand a little bit more today than he was yesterday.  Continue atorvastatin. 2. STEMI.  Medical management with Plavix, atorvastatin and  Coreg. 3. Hyperlipidemia.  LDL 75.  Goal less than 70.  Atorvastatin should achieve this goal. 4. Chronic kidney disease stage IIIb. 5. Paroxysmal atrial fibrillation.  Patient likely can start Eliquis on Sunday. 6. Cardiomyopathy with chronic systolic congestive heart failure.  No ACE or ARB with kidney injury.  No spironolactone with kidney injury.  On beta-blocker only.  Code Status:     Code Status Orders  (From admission, onward)         Start     Ordered   05/19/19 1459  Full code  Continuous     12 /07/20 1459        Code Status History    This patient has a current code status but no historical code status.   Advance Care Planning Activity     Family Communication: Spoke with sister at the bedside Disposition Plan: I am concerned about this patient living alone.  Neurology wanted to watch through the weekend and see how he does on Eliquis on Sunday.  Consultants:  Cardiology  Neurology  Time spent: 29 minutes.  Case discussed with neurology and cardiology.  [place]  [facility] [employer]

## 2019-05-23 NOTE — Progress Notes (Signed)
Occupational Therapy Treatment Patient Details Name: Gordon [name] MRN: [id number] DOB: [birth year] Today's Date: 05/23/2019    History of present illness Gordon [name] is a 44yoM who [facility] c CP and gen weakness, notable HTN and AFcRVR, pt admitted c STEMI, HeadCT negaitve. Pt underwent a cardiac catheterization on 12/7 status post unsuccessful balloon angioplasty of the RCA due to diffuse disease, calcification and thrombus. Pt having expressive difficulty and motor control impairment of the right hemibody. Head CT/MRI showing acute CVA. PTA pt was fully independent, retired but looking ino to working part time in the future.   OT comments  Gordon [name] was seen for OT treatment on this date. Pt received supine in bed, awake/alert, with his sister present at bedside. Pt denies pain this date and is eager to participate in OT tx session. Pt states he does not have personal belongings or clothing to trial dressing this date. Is agreable to using hospital garments. OT engages pt in seated lower body dressing and standing grooming tasks (see ADL section below for detailed occupational performance). 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 He requires increased levels of assistance as activity demands increase. Pt required moderate assist to doff/don socks 2/2 decreased motor planning, fine motor coordination, and balance deficits. Pt is making progress to OT goals and contines to be motivated t/o therapy sessions, however, remains far from his baseline to perform basic activities of daily living. Therapist provides consistent cueing to utilize compensatory dressing and grooming strategies, and pt return demonstrates with moderate-max multimodal cueing for technique. Pt continues to benefit from skilled OT services to maximize return to PLOF and minimize risk of future falls, injury, caregiver burden, and  readmission. Continue to strongly recommend acute inpatient rehabilitative services upon hospital discharge. Will continue to follow POC. Frequency remains appropriate.   Follow Up Recommendations  CIR    Equipment Recommendations  3 in 1 bedside commode    Recommendations for Other Services      Precautions / Restrictions Precautions Precautions: Fall Restrictions Weight Bearing Restrictions: No       Mobility Bed Mobility Overal bed mobility: Needs Assistance Bed Mobility: Supine to Sit;Sit to Supine     Supine to sit: Min assist Sit to supine: Min assist   General bed mobility comments: Assist for RLE/mgt of condom cath during sup<>sit  Transfers Overall transfer level: Needs assistance Equipment used: Rolling walker (2 wheeled) Transfers: Sit to/from Stand Sit to Stand: Min assist;Mod assist         General transfer comment: Min A for lift off with max cueing for safety and squencing. Mod A to maintain balance during transitional movements, particularly turning to the right and backing toward bed.    Balance Overall balance assessment: Modified Independent;Needs assistance Sitting-balance support: Feet supported;Single extremity supported Sitting balance-Leahy Scale: Fair Sitting balance - Comments: Pt with decreased sitting balance observed this date. Sits unsupported during static tasks, but requires min to moderate assist to maintain sitting balance when attempting to doff/don socks this date.   Standing balance support: Single extremity supported;During functional activity Standing balance-Leahy Scale: Fair Standing balance comment: struggles unsupported, but single limb balance is fairly successful.                           ADL either performed or assessed with clinical judgement   ADL   Eating/Feeding:  Sitting;With adaptive utensils;Minimal assistance Eating/Feeding Details (indicate cue type and reason): Pt and caregiver (sister) educated  on compensatory self-feeding strategies. Sister educated on use of built up handle to support pt grip on utensils during self-feeding. Pt sister reports that pt required consistent assistance with his breakfast tray. Pt was unable to load eggs, potatoes, or sausage onto his for/spoon. Pt sister endorses provding consistent assistance throughout the meal. Pt and provider problem solved strategies for improving satisfaction with self-feeding and maximizing pt independence. Grooming: Standing;Moderate assistance;Cueing for compensatory techniques;Cueing for sequencing;Cueing for safety;Oral care;Set up;Wash/dry face Grooming Details (indicate cue type and reason): Pt stands at sink, given consistent physical assist from this therapist to maintain balance, and completes oral care and face washing this date. Pt requires moderate cueing throughout in order to use BUE. 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 This therapists blocks pt LUE to encourage pt to brush teeth using only his RUE. RUE coordination improves mildly with LUE blocked, however, pt continues to have significant difficulty with precise movements and gripping the toothbrush this date. Built-up handle applied to toothbrush to support functional grip.             Lower Body Dressing: Moderate assistance;Cueing for safety;Cueing for sequencing;Sit to/from stand Lower Body Dressing Details (indicate cue type and reason): Pt doffs and dons hospital socks this date given physical assist from this therapist to maintain seated balance during weight sifht 2/2 decreased motor coordination and significant apraxia. Pt requires consistent multi-modal cueing to orient sock appropriately, and is unable to don sock on his RLE this date. OT assists pt with placing sock over his R foot and pt pulls sock up his ankle and adjusts for comfort with physical assist to maintain balance. Pt movements continue to be  discoordinated and pt has decreased awareness of actions particularly with his RUE, e.g. one point pulling on his catheter tubing rather than his sock when attempting to dress himself.             Functional mobility during ADLs: Moderate assistance General ADL Comments: +1 moderate assist for functional transfer from EOB to standing at room sink for grooming this date. Pt requires more significant assist during transitional movements and directional changes as well as consistent cueing (max) for safety and sequencing t/o functional mobility this date     Vision Baseline Vision/History: Wears glasses Wears Glasses: Reading only Vision Assessment?: Vision impaired- to be further tested in functional context   Perception     Praxis Praxis Praxis-Other Comments: Pt has difficulty with appropriate use of toothbrush this date. He attempts to hold toothbrush with two hands (one hand on head of toothbrush and one hand on handle) when completing oral care and requires physical blocking of his LUE in order to hold appropriately. Pt observed to use gross motor patterns t/o tasks with significantly limited refined fine motor coordination. He is unable to fully open his RUE to hold toothbrush and requires physical assist from therapist to adjust grasp.    Cognition Arousal/Alertness: Awake/alert Behavior During Therapy: WFL for tasks assessed/performed;Agitated(Agitated with speech and motor tasks, but easily re-directed and with good effort throughout session.) Overall Cognitive Status: Impaired/Different from baseline Area of Impairment: Safety/judgement;Following commands;Problem solving;Awareness                       Following Commands: Follows one step commands with increased time;Follows multi-step commands inconsistently   Awareness: Emergent Problem  Solving: Difficulty sequencing;Requires tactile cues General Comments: continues to have limited awareness of Rt limbs, limited  sensation/proprioception.        Exercises Other Exercises Other Exercises: OT engages pt in seted LB dressing task as well as standing grooming tasks at room sink this date, pt requires moderate assist throughout functional activities (See ADL section for additional detail).   Shoulder Instructions       General Comments Pt observed to spit out 2-3 1/8" to 1/4" pieces of white stone or tooth like matter during teethbrushing. Pt appears initially unaware of these small pieces but when this therapists draws his attention to them he appears familiar and repeats "its not a problem its not a problem" but is unable to state why or how he knows 2/2 expressive difficulties. RN notified.    Pertinent Vitals/ Pain       Pain Assessment: No/denies pain  Home Living                                          Prior Functioning/Environment              Frequency  Min 3X/week        Progress Toward Goals  OT Goals(current goals can now be found in the care plan section)  Progress towards OT goals: Progressing toward goals  Acute Rehab OT Goals Patient Stated Goal: To regain my independence OT Goal Formulation: With patient Time For Goal Achievement: 06/05/19 Potential to Achieve Goals: Good  Plan Discharge plan remains appropriate;Frequency remains appropriate    Co-evaluation                 AM-PAC OT "6 Clicks" Daily Activity     Outcome Measure   Help from another person eating meals?: A Little Help from another person taking care of personal grooming?: A Lot Help from another person toileting, which includes using toliet, bedpan, or urinal?: A Lot Help from another person bathing (including washing, rinsing, drying)?: A Lot Help from another person to put on and taking off regular upper body clothing?: A Little Help from another person to put on and taking off regular lower body clothing?: A Lot 6 Click Score: 14    End of Session Equipment  Utilized During Treatment: Gait belt  OT Visit Diagnosis: Other abnormalities of gait and mobility (R26.89);Hemiplegia and hemiparesis Hemiplegia - Right/Left: Right Hemiplegia - dominant/non-dominant: Dominant Hemiplegia - caused by: Cerebral infarction   Activity Tolerance Patient tolerated treatment well   Patient Left in bed;with bed alarm set;with call bell/phone within reach;with family/visitor present   Nurse Communication          Time: [id number] OT Time Calculation (min): 26 min  Charges: OT General Charges $OT Visit: 1 Visit OT Treatments $Self Care/Home Management : 23-37 mins  Shara Blazing, M.S., OTR/L Ascom: [phone] 05/23/19, 11:45 AM

## 2019-05-23 NOTE — Progress Notes (Addendum)
Physical Therapy Treatment Patient Details Name: Gordon [name] MRN: [id number] DOB: May 18, 1956 Today's Date: 05/23/2019    History of Present Illness Gordon [name] is a 9yoM who comes to [facility] 12/7 c CP and gen weakness, notable HTN and AFcRVR, pt admitted c STEMI, HeadCT negaitve. Pt underwent a cardiac catheterization on 12/7 status post unsuccessful balloon angioplasty of the RCA due to diffuse disease, calcification and thrombus. Pt having expressive difficulty and motor control impairment of the right hemibody. Head CT/MRI showing acute CVA. PTA pt was fully independent, retired but looking ino to working part time in the future.    PT Comments    Pt in room with sister at bedside, pt very smiley this date and excited to work with Chief Strategy Officer. Session starting with 438ft AMB with RW, steady in straight line of progression with close guarding with cued turns. Gait speed improving as is tolerance to AMB which has quadrupled now 2 days straight. PT AMB in room without device as well, much slower and cautious, minGuard assist but no gross LOB. Apraxia of RUE continues to pose a challenge for LOB correction as accuracy and spatial awareness remains limited. Continued with unsupported functional UE use with cues for fine motor accuracy. Session finished with unsupported STS transfers hands free c 1 LOB and loss of RLE control after 3x. Pt demonstrate some awareness of impairment in reluctance to fully abate limb use when cued and decreased gait speed in more difficulty AMB scenarios. Pt demonstrates less lability this date, less impulsivity. Expressive speech seems slightly improved.    Follow Up Recommendations  CIR;Supervision for mobility/OOB;Supervision - Intermittent     Equipment Recommendations  None recommended by PT    Recommendations for Other Services       Precautions / Restrictions Precautions Precautions: Fall Restrictions Weight Bearing Restrictions: No    Mobility  Bed  Mobility Overal bed mobility: Needs Assistance Bed Mobility: Supine to Sit;Sit to Supine     Supine to sit: Supervision Sit to supine: Min assist   General bed mobility comments: better this date, RLE does not require any guidance.  Transfers Overall transfer level: Needs assistance Equipment used: Rolling walker (2 wheeled);None Transfers: Sit to/from Stand Sit to Stand: Min assist;Mod assist         General transfer comment: RW for safety, but practiced rising from chair without arms or assist 5x, 1 LOB and loss of RLE control with onset of fatigue  Ambulation/Gait Ambulation/Gait assistance: Min assist Gait Distance (Feet): 400 Feet(also AMB 80 in room, minGuard assist without AD.) Assistive device: Rolling walker (2 wheeled) Gait Pattern/deviations: Step-through pattern Gait velocity: 0.1m/s   General Gait Details: better facility with turns which remain unsteady but require less time; visual cues for wayfinding; less obvious onset fatigue. AMB more quickly. Harder impact on RLE.   Stairs             Wheelchair Mobility    Modified Rankin (Stroke Patients Only)       Balance Overall balance assessment: Modified Independent;Needs assistance Sitting-balance support: Feet supported;No upper extremity supported Sitting balance-Leahy Scale: Good Sitting balance - Comments: Pt with decreased sitting balance observed this date. Sits unsupported during static tasks, but requires min to moderate assist to maintain sitting balance when attempting to doff/don socks this date.   Standing balance support: During functional activity;No upper extremity supported Standing balance-Leahy Scale: Good Standing balance comment: mild difficulty with differentiation of BUE for single limb tasks.  Cognition Arousal/Alertness: Awake/alert Behavior During Therapy: WFL for tasks assessed/performed;Agitated Overall Cognitive Status:  Impaired/Different from baseline Area of Impairment: Safety/judgement;Following commands;Problem solving;Awareness                       Following Commands: Follows one step commands with increased time;Follows multi-step commands inconsistently Safety/Judgement: Decreased awareness of deficits;Decreased awareness of safety Awareness: Emergent Problem Solving: Difficulty sequencing;Requires tactile cues General Comments: continues to have limited awareness of Rt limbs, limited sensation/proprioception.      Exercises Other Exercises Other Exercises: zero UE support on RW c contrlateral target tapping on daily status board using visual and verbal cues, 1x10 bilat. Cued to use index finger which remain difficult. Other Exercises: STS from recliner hands-free, cued smooth controlled movement and balance: 1x5, has difficulty with keeping Rt knee flexion only after 3x, 1 significant LOB requires maxA for control. Other Exercises: AMB in room without assistive device 19ft, minGuard assist, cues for turns, no LOB but quite unsteady. Other Exercises: OT engages pt in seted LB dressing task as well as standing grooming tasks at room sink this date, pt requires moderate assist throughout functional activities (See ADL section for additional detail).    General Comments General comments (skin integrity, edema, etc.): Pt observed to spit out 2-3 1/8" to 1/4" pieces of white stone or tooth like matter during teethbrushing. Pt appears initially unaware of these small pieces but when this therapists draws his attention to them he appears familiar and repeats "its not a problem its not a problem" but is unable to state why or how he knows 2/2 expressive difficulties. RN notified.      Pertinent Vitals/Pain Pain Assessment: No/denies pain    Home Living                      Prior Function            PT Goals (current goals can now be found in the care plan section) Acute Rehab PT  Goals Patient Stated Goal: To regain my independence PT Goal Formulation: With patient Time For Goal Achievement: 06/04/19 Potential to Achieve Goals: Fair Progress towards PT goals: Progressing toward goals    Frequency    7X/week      PT Plan Current plan remains appropriate    Co-evaluation              AM-PAC PT "6 Clicks" Mobility   Outcome Measure  Help needed turning from your back to your side while in a flat bed without using bedrails?: A Little Help needed moving from lying on your back to sitting on the side of a flat bed without using bedrails?: A Little Help needed moving to and from a bed to a chair (including a wheelchair)?: A Little Help needed standing up from a chair using your arms (e.g., wheelchair or bedside chair)?: A Little Help needed to walk in hospital room?: A Little Help needed climbing 3-5 steps with a railing? : A Little 6 Click Score: 18    End of Session Equipment Utilized During Treatment: Gait belt Activity Tolerance: Patient tolerated treatment well Patient left: with call bell/phone within reach;with chair alarm set;in chair;with family/visitor present Nurse Communication: Mobility status PT Visit Diagnosis: Unsteadiness on feet (R26.81);Difficulty in walking, not elsewhere classified (R26.2);Apraxia (R48.2);Ataxic gait (R26.0);Other abnormalities of gait and mobility (R26.89)     Time: [id number] PT Time Calculation (min) (ACUTE ONLY): 30 min  Charges:  $Neuromuscular Re-education: 23-37 mins  1:45 PM, 05/23/19 Etta Grandchild, PT, DPT Physical Therapist - [facility]  [phone] ([place])    Anaija Wissink C 05/23/2019, 1:39 PM

## 2019-05-24 MED ORDER — CARVEDILOL 3.125 MG PO TABS
3.1250 mg | ORAL_TABLET | Freq: Two times a day (BID) | ORAL | Status: DC
  Administered 2019-05-24 – 2019-05-26 (×4): 3.125 mg via ORAL
  Filled 2019-05-24 (×4): qty 1

## 2019-05-24 NOTE — Plan of Care (Signed)
  Problem: Pain Managment: Goal: General experience of comfort will improve Outcome: Progressing   Problem: Safety: Goal: Ability to remain free from injury will improve Outcome: Progressing   

## 2019-05-24 NOTE — Progress Notes (Addendum)
Physical Therapy Treatment Patient Details Name: Gordon [name] MRN: [id number] DOB: January 05, 1956 Today's Date: 05/24/2019    History of Present Illness Gordon [name] is a 81yoM who comes to [facility] 12/7 c CP and gen weakness, notable HTN and AFcRVR, pt admitted c STEMI, HeadCT negaitve. Pt underwent a cardiac catheterization on 12/7 status post unsuccessful balloon angioplasty of the RCA due to diffuse disease, calcification and thrombus. Pt having expressive difficulty and motor control impairment of the right hemibody. Head CT/MRI showing acute CVA. PTA pt was fully independent, retired but looking ino to working part time in the future.    PT Comments    Pt ambulating to bathroom with RN upon arrival.  Assisted pt off commode while she readied medications.  Walked out with RW and generally unsteady gait with quick movements resulting in walker tipping over on 2 wheels while rounding the corner.  Education provided regarding safety.  After RN finished, he was able to walk x 1 lap in hallway with walker.  Safety deficits remain in regards to awareness, speed and navigation.  Gait without walker for 50' but pt with increased BOS and arms out to the side.  Voiced general uneasiness with gait without walker today in open hallways vs room where he could use objects to assist as needed.  Practiced higher level balance tasks in hallway.  Sidestepping with min guard and no UE support -> increased difficulty to R side, tandem gait - walks quickly due to fear of falling with cues to slow down, backwards gait uncontrolled.  He is able to reach down hesitantly to pick up an object that had fallen off of a housekeeping cart but needed min assist to stand back up for support and balance.  BERG balance test completed when back in room.  30/57 One major LOB when trying to complete step taps and required mod assist to guide pt back to a safe seated position in chair.  Pt with most difficulties with single leg stance task and  turns with is evident in his mobility.  Safety awareness remains and balance remain his biggest barriers to a safe discharge. Aphasia remains and pt apologizes multiple times during session for difficulty finding words and when he looses his balance.  He is aware he is not safe to walk alone and that he would most likely fall without assist.  CIR remains appropriate as pt is motivated to participate and can handle longer sessions at high intensity.    Follow Up Recommendations  CIR     Equipment Recommendations  Rolling walker with 5" wheels    Recommendations for Other Services       Precautions / Restrictions Precautions Precautions: Fall Restrictions Weight Bearing Restrictions: No    Mobility  Bed Mobility               General bed mobility comments: up with nursing upon arrival to bathroom and remained in chair after session.  Transfers Overall transfer level: Needs assistance Equipment used: Rolling walker (2 wheeled);None Transfers: Sit to/from Stand Sit to Stand: Min guard            Ambulation/Gait Ambulation/Gait assistance: Min Web designer (Feet): 400 Feet Assistive device: Rolling walker (2 wheeled);None Gait Pattern/deviations: Step-through pattern Gait velocity: decreased   General Gait Details: remains unsteady with quick movements somewhat impulsive at times especially when fatigued or with multi-step tasks.   Stairs             Emergency planning/management officer  Modified Rankin (Stroke Patients Only)       Balance Overall balance assessment: Needs assistance Sitting-balance support: Feet supported Sitting balance-Leahy Scale: Good     Standing balance support: During functional activity;No upper extremity supported Standing balance-Leahy Scale: Fair                   Standardized Balance Assessment Standardized Balance Assessment : Berg Balance Test Berg Balance Test Sit to Stand: Able to stand  independently using  hands Standing Unsupported: Able to stand 2 minutes with supervision Sitting with Back Unsupported but Feet Supported on Floor or Stool: Able to sit safely and securely 2 minutes Stand to Sit: Sits safely with minimal use of hands Transfers: Able to transfer with verbal cueing and /or supervision Standing Unsupported with Eyes Closed: Able to stand 10 seconds with supervision Standing Ubsupported with Feet Together: Able to place feet together independently and stand for 1 minute with supervision From Standing, Reach Forward with Outstretched Arm: Can reach forward >5 cm safely (2") From Standing Position, Pick up Object from Floor: Unable to pick up shoe, but reaches 2-5 cm (1-2") from shoe and balances independently From Standing Position, Turn to Look Behind Over each Shoulder: Turn sideways only but maintains balance Turn 360 Degrees: Needs close supervision or verbal cueing Standing Unsupported, Alternately Place Feet on Step/Stool: Needs assistance to keep from falling or unable to try Standing Unsupported, One Foot in Front: Needs help to step but can hold 15 seconds Standing on One Leg: Unable to try or needs assist to prevent fall Total Score: 30        Cognition Arousal/Alertness: Awake/alert Behavior During Therapy: WFL for tasks assessed/performed;Agitated Overall Cognitive Status: Impaired/Different from baseline Area of Impairment: Safety/judgement;Following commands;Problem solving;Awareness                       Following Commands: Follows one step commands with increased time;Follows multi-step commands inconsistently Safety/Judgement: Decreased awareness of deficits;Decreased awareness of safety   Problem Solving: Difficulty sequencing;Requires tactile cues        Exercises Other Exercises Other Exercises: higher level balance tasks for gait including sidestepping, tandem and backwards gait.    General Comments        Pertinent Vitals/Pain Pain  Assessment: No/denies pain    Home Living                      Prior Function            PT Goals (current goals can now be found in the care plan section) Progress towards PT goals: Progressing toward goals    Frequency    7X/week      PT Plan Current plan remains appropriate    Co-evaluation              AM-PAC PT "6 Clicks" Mobility   Outcome Measure  Help needed turning from your back to your side while in a flat bed without using bedrails?: A Little Help needed moving from lying on your back to sitting on the side of a flat bed without using bedrails?: A Little Help needed moving to and from a bed to a chair (including a wheelchair)?: A Little Help needed standing up from a chair using your arms (e.g., wheelchair or bedside chair)?: A Little Help needed to walk in hospital room?: A Little Help needed climbing 3-5 steps with a railing? : A Little 6 Click Score: 18  End of Session Equipment Utilized During Treatment: Gait belt Activity Tolerance: Patient tolerated treatment well Patient left: with chair alarm set;in chair;Other (comment);with call bell/phone within reach Nurse Communication: Mobility status;Other (comment)       Time: [id number] PT Time Calculation (min) (ACUTE ONLY): 42 min  Charges:  $Gait Training: 23-37 mins $Therapeutic Activity: 8-22 mins                     Chesley Noon, PTA 05/24/19, 11:11 AM

## 2019-05-24 NOTE — Progress Notes (Signed)
  Speech Language Pathology Treatment: Cognitive-Linquistic  Patient Details Name: Gordon [name] MRN: [id number] DOB: [birth year] Today's Date: 05/24/2019 Time: 0950-1020 SLP Time Calculation (min) (ACUTE ONLY): 30 min  Assessment / Plan / Recommendation Clinical Impression  SLP treatment today focused on expressive speech ability. He presents with a rote speech ability to count to 10, say ABCD but unable to complete ABC's past D,  He is able to say days of the week but month of the year he needs phonemic cues past February.  He is able to label items within the room with 9/10 accuracy, he does need cues. For remote he said, "it is for the TV but is unable to independently state the remote until given a initial phonemic cue. With cue, he did state remote. He was able to complete sentences with 8/10 acc, and complete opposites with 9/10 acc.  Pt often struggles to use correct phonemes in the word such as "banket" for blanket. He becomes frustrated with himself and is aware of his deficits. St will continue to monitor and work with expressive language. Inpatient rehab is recommended for intense therapy.   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

## 2019-05-24 NOTE — Progress Notes (Signed)
Patient ID: Gordon [name], male   DOB: August 10, 1955, 63 y.o.   MRN: [id number] [facility] PROGRESS NOTE  Radcliffe Nuhn [id number] DOB: 1955-08-04 DOA: 05/19/2019 PCP: System, Pcp Not In  HPI/Subjective: Patient feels okay.  Gets frustrated if he cannot get his words out the way he likes.  States that he understands what I am saying but just cannot express himself the way he wants to.  Objective: Vitals:   05/24/19 0918 05/24/19 0955  BP:  (!) 149/90  Pulse: 67 71  Resp:    Temp:  98.4 F (36.9 C)  SpO2:  98%    Filed Weights   05/22/19 0420 05/23/19 0518 05/24/19 0533  Weight: 73.8 kg 75 kg 74.4 kg    ROS: Review of Systems  Constitutional: Negative for chills and fever.  Eyes: Negative for blurred vision.  Respiratory: Negative for cough and shortness of breath.   Cardiovascular: Negative for chest pain.  Gastrointestinal: Negative for abdominal pain, constipation, diarrhea, nausea and vomiting.  Genitourinary: Negative for dysuria.  Musculoskeletal: Negative for joint pain.  Neurological: Positive for focal weakness. Negative for dizziness and headaches.   Exam: Physical Exam  HENT:  Nose: No mucosal edema.  Mouth/Throat: No oropharyngeal exudate or posterior oropharyngeal edema.  Eyes: Pupils are equal, round, and reactive to light. Conjunctivae, EOM and lids are normal.  Neck: No JVD present. Carotid bruit is not present. No thyroid mass and no thyromegaly present.  Cardiovascular: S1 normal and S2 normal. Exam reveals no gallop.  No murmur heard. Pulses:      Dorsalis pedis pulses are 2+ on the right side and 2+ on the left side.  Respiratory: No respiratory distress. He has no wheezes. He has no rhonchi. He has no rales.  GI: Soft. Bowel sounds are normal. There is no abdominal tenderness.  Musculoskeletal:     Cervical back: No edema.     Right ankle: No swelling.     Left ankle: No swelling.  Lymphadenopathy:    He has no cervical adenopathy.   Neurological: He is alert. No cranial nerve deficit.  Power 5 out of 5 upper and lower extremities but Coordination off with his right hand and arm.  Patient is unable to move his thumb to touch his fingers on his right hand.  Skin: Skin is warm. No rash noted. Nails show no clubbing.  Psychiatric:  Slow with some responses.      Data Reviewed: Basic Metabolic Panel: Recent Labs  Lab 05/19/19 1026 05/20/19 0408 05/21/19 0427 05/23/19 0850  NA 135 135 137 137  K 3.2* 4.2 3.8 4.2  CL 104 107 108 109  CO2 16* 19* 18* 20*  GLUCOSE 155* 107* 89 111*  BUN 32* 32* 30* 28*  CREATININE 1.78* 1.95* 2.01* 1.94*  CALCIUM 8.5* 8.2* 8.1* 8.5*   Liver Function Tests: Recent Labs  Lab 05/19/19 1026 05/20/19 0408  AST 21 22  ALT 16 18  ALKPHOS 76 67  BILITOT 1.0 1.0  PROT 6.5 5.9*  ALBUMIN 2.9* 2.6*   CBC: Recent Labs  Lab 05/19/19 1026 05/20/19 0408 05/21/19 0425  WBC 11.7* 11.2* 10.2  HGB 13.3 12.2* 12.0*  HCT 38.3* 35.8* 36.4*  MCV 86.8 88.2 91.5  PLT 304 282 278    CBG: Recent Labs  Lab 05/19/19 1443  GLUCAP 100*    Recent Results (from the past 240 hour(s))  SARS Coronavirus 2 by RT PCR (hospital order, performed in [facility] hospital lab) Nasopharyngeal Nasopharyngeal Swab  Status: None   Collection Time: 05/19/19 12:40 PM   Specimen: Nasopharyngeal Swab  Result Value Ref Range Status   SARS Coronavirus 2 NEGATIVE NEGATIVE Final    Comment: (NOTE) SARS-CoV-2 target nucleic acids are NOT DETECTED. The SARS-CoV-2 RNA is generally detectable in upper and lower respiratory specimens during the acute phase of infection. The lowest concentration of SARS-CoV-2 viral copies this assay can detect is 250 copies / mL. A negative result does not preclude SARS-CoV-2 infection and should not be used as the sole basis for treatment or other patient management decisions.  A negative result may occur with improper specimen collection / handling, submission of  specimen other than nasopharyngeal swab, presence of viral mutation(s) within the areas targeted by this assay, and inadequate number of viral copies (<250 copies / mL). A negative result must be combined with clinical observations, patient history, and epidemiological information. Fact Sheet for Patients:   StrictlyIdeas.no Fact Sheet for Healthcare Providers: BankingDealers.co.za This test is not yet approved or cleared  by the [place] FDA and has been authorized for detection and/or diagnosis of SARS-CoV-2 by FDA under an Emergency Use Authorization (EUA).  This EUA will remain in effect (meaning this test can be used) for the duration of the COVID-19 declaration under Section 564(b)(1) of the Act, 21 U.S.C. section 360bbb-3(b)(1), unless the authorization is terminated or revoked sooner. Performed at [facility], [address] [place], [place] [address]   MRSA PCR Screening     Status: None   Collection Time: 05/19/19  3:57 PM   Specimen: Nasal Mucosa; Nasopharyngeal  Result Value Ref Range Status   MRSA by PCR NEGATIVE NEGATIVE Final    Comment:        The GeneXpert MRSA Assay (FDA approved for NASAL specimens only), is one component of a comprehensive MRSA colonization surveillance program. It is not intended to diagnose MRSA infection nor to guide or monitor treatment for MRSA infections. Performed at [facility], [address] [place], [place] [address]      Studies: CT HEAD WO CONTRAST  Result Date: 05/23/2019 CLINICAL DATA:  Follow-up stroke EXAM: CT HEAD WITHOUT CONTRAST TECHNIQUE: Contiguous axial images were obtained from the base of the skull through the vertex without intravenous contrast. COMPARISON:  MRI 05/21/2019.  CT 05/19/2019. FINDINGS: Brain: Discrete low-density now evident in the region of acute infarction within the left MCA territory affecting the deep insula and  frontoparietal cortical and subcortical brain. Mild swelling but no evidence of mass effect or hemorrhage. No other acute or subacute infarction identified. Chronic small-vessel ischemic changes affect the cerebral hemispheric white matter elsewhere. Chronic small-vessel changes of the pons. No mass, hydrocephalus or extra-axial collection. Vascular: There is atherosclerotic calcification of the major vessels at the base of the brain. Skull: Negative Sinuses/Orbits: Clear/normal Other: None IMPRESSION: Discrete low-density now evident at CT in the region of acute infarction in the left MCA territory affecting the deep insula and frontoparietal brain. No mass effect or hemorrhagic transformation. Electronically Signed   By: Nelson Chimes M.D.   On: 05/23/2019 10:36    Scheduled Meds: . [START ON 05/25/2019] apixaban  5 mg Oral BID  . atorvastatin  40 mg Oral q1800  . carvedilol  3.125 mg Oral BID WC  . Chlorhexidine Gluconate Cloth  6 each Topical Daily  . clopidogrel  75 mg Oral Daily  . feeding supplement (ENSURE ENLIVE)  237 mL Oral BID BM  . hydrALAZINE  25 mg Oral TID  .  isosorbide mononitrate  60 mg Oral Daily  . sodium chloride flush  3 mL Intravenous Q12H   Continuous Infusions: . sodium chloride      Assessment/Plan:  1. 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  Repeat CT scan today did not show any hemorrhage.  Continue Plavix as per cardiology.  Starting Eliquis tomorrow Sunday morning.  Patient having difficulty with processing words and saying what he wants to say.  Patient also has incoordination of the right arm.  Patient was evaluated by acute rehab and not a candidate.  I am concerned about this patient living alone.  Patient is right-handed and has incoordination with his right hand.  Continue atorvastatin. 2. STEMI.  Medical management with Plavix, atorvastatin and Coreg. 3. Hyperlipidemia.  LDL 75.  Goal less  than 70.  Atorvastatin prescribed 4. Chronic kidney disease stage IIIb. 5. Paroxysmal atrial fibrillation.  Patient will start Eliquis on Sunday. 6. Cardiomyopathy with chronic systolic congestive heart failure.  No ACE or ARB with kidney injury.  No spironolactone with kidney injury.  On beta-blocker only.  Code Status:     Code Status Orders  (From admission, onward)         Start     Ordered   05/19/19 1459  Full code  Continuous     12 /07/20 1459        Code Status History    This patient has a current code status but no historical code status.   Advance Care Planning Activity     Family Communication: Spoke with sister on the phone Disposition Plan: I am concerned about this patient living alone.  Neurology wanted to watch through the weekend and see how he does on Eliquis starting on Sunday.  Consultants:  Cardiology  Neurology  Time spent: 27 minutes.  [place]  [facility] [employer]

## 2019-05-24 NOTE — Progress Notes (Addendum)
Progress Note  Patient Name: Gordon [name] Date of Encounter: 05/24/2019  Primary Cardiologist: Kathlyn Sacramento, MD  Subjective   Denies CP  No SOB  Walked earlier   Does not want to go home  Inpatient Medications    Scheduled Meds: . [START ON 05/25/2019] apixaban  5 mg Oral BID  . atorvastatin  40 mg Oral q1800  . carvedilol  6.25 mg Oral BID WC  . Chlorhexidine Gluconate Cloth  6 each Topical Daily  . clopidogrel  75 mg Oral Daily  . feeding supplement (ENSURE ENLIVE)  237 mL Oral BID BM  . hydrALAZINE  25 mg Oral TID  . isosorbide mononitrate  60 mg Oral Daily  . sodium chloride flush  3 mL Intravenous Q12H   Continuous Infusions: . sodium chloride     PRN Meds: sodium chloride, acetaminophen, ondansetron (ZOFRAN) IV, sodium chloride flush   Vital Signs    Vitals:   05/24/19 0533 05/24/19 0535 05/24/19 0918 05/24/19 0955  BP: (!) 148/91 (!) 147/74  (!) 149/90  Pulse: (!) 55 (!) 58 67 71  Resp:      Temp: 98.2 F (36.8 C)   98.4 F (36.9 C)  TempSrc: Oral     SpO2: 97% 97%  98%  Weight: 74.4 kg     Height:        Intake/Output Summary (Last 24 hours) at 05/24/2019 1002 Last data filed at 05/24/2019 0537 Gross per 24 hour  Intake 480 ml  Output 600 ml  Net -120 ml   Filed Weights   05/22/19 0420 05/23/19 0518 05/24/19 0533  Weight: 73.8 kg 75 kg 74.4 kg    Physical Exam   Constitutional:  NO acute  distress.  HENT:  Head: Grossly normal Eyes:  no discharge. No scleral icterus.  Neck: JVP is normal  Cardiovascular: Regular rate and rhythm, no murmurs appreciated Pulmonary/Chest: Clear to auscultation bilaterally, no wheezes or rales  Mild rhonchi Abdominal: Soft.  no distension.  no tenderness.  Musculoskeletal: Normal range of motion Neurological:  Deferred Skin: Skin warm and dry    Labs    Chemistry Recent Labs  Lab 05/19/19 1026 05/20/19 0408 05/21/19 0427 05/23/19 0850  NA 135 135 137 137  K 3.2* 4.2 3.8 4.2  CL 104 107 108  109  CO2 16* 19* 18* 20*  GLUCOSE 155* 107* 89 111*  BUN 32* 32* 30* 28*  CREATININE 1.78* 1.95* 2.01* 1.94*  CALCIUM 8.5* 8.2* 8.1* 8.5*  PROT 6.5 5.9*  --   --   ALBUMIN 2.9* 2.6*  --   --   AST 21 22  --   --   ALT 16 18  --   --   ALKPHOS 76 67  --   --   BILITOT 1.0 1.0  --   --   GFRNONAA 39* 35* 34* 36*  GFRAA 46* 41* 39* 41*  ANIONGAP 15 9 11 8      Hematology Recent Labs  Lab 05/19/19 1026 05/20/19 0408 05/21/19 0425  WBC 11.7* 11.2* 10.2  RBC 4.41 4.06* 3.98*  HGB 13.3 12.2* 12.0*  HCT 38.3* 35.8* 36.4*  MCV 86.8 88.2 91.5  MCH 30.2 30.0 30.2  MCHC 34.7 34.1 33.0  RDW 13.5 13.8 14.1  PLT 304 282 278    Cardiac Enzymes  Recent Labs  Lab 05/19/19 1026 05/19/19 1446 05/19/19 1720  TROPONINIHS 3,929* ON:[id number]* 3,729*       Radiology    CT HEAD WO CONTRAST  Result Date: 05/23/2019 CLINICAL DATA:  Follow-up stroke EXAM: CT HEAD WITHOUT CONTRAST TECHNIQUE: Contiguous axial images were obtained from the base of the skull through the vertex without intravenous contrast. COMPARISON:  MRI 05/21/2019.  CT 05/19/2019. FINDINGS: Brain: Discrete low-density now evident in the region of acute infarction within the left MCA territory affecting the deep insula and frontoparietal cortical and subcortical brain. Mild swelling but no evidence of mass effect or hemorrhage. No other acute or subacute infarction identified. Chronic small-vessel ischemic changes affect the cerebral hemispheric white matter elsewhere. Chronic small-vessel changes of the pons. No mass, hydrocephalus or extra-axial collection. Vascular: There is atherosclerotic calcification of the major vessels at the base of the brain. Skull: Negative Sinuses/Orbits: Clear/normal Other: None IMPRESSION: Discrete low-density now evident at CT in the region of acute infarction in the left MCA territory affecting the deep insula and frontoparietal brain. No mass effect or hemorrhagic transformation. Electronically Signed    By: Nelson Chimes M.D.   On: 05/23/2019 10:36    Telemetry    SB - Personally Reviewed  Cardiac Studies   Cardiac Catheterization and Percutaneous Coronary Intervention 12.7.2020  Left Anterior Descending  Ost LAD to Prox LAD lesion 30% stenosed  Ost LAD to Prox LAD lesion is 30% stenosed.  Prox LAD to Mid LAD lesion 30% stenosed  Prox LAD to Mid LAD lesion is 30% stenosed.  Mid LAD to Dist LAD lesion 60% stenosed  Mid LAD to Dist LAD lesion is 60% stenosed.  Left Circumflex  There is mild diffuse disease throughout the vessel.  Left Posterior Atrioventricular Artery  The vessel exhibits minimal luminal irregularities.  Right Coronary Artery  Prox RCA to Mid RCA lesion 100% stenosed  Prox RCA to Mid RCA lesion is 100% stenosed. Vessel is the culprit lesion. The lesion is type C and thrombotic with left-to-right collateral flow. The lesion is severely calcified.  Right Posterior Descending Artery  Collaterals  RPDA filled by collaterals from Dist LAD.    RPDA lesion 90% stenosed  RPDA lesion is 90% stenosed.  EF of 30 to 35% with akinesis of the inferior wall.   **Unsuccessful balloon angioplasty of the right coronary artery given diffuse disease, calcifications and thrombus. This was in spite of multiple balloon inflations throughout the whole vessel.  _____________   2D Echocardiogram    1. Left ventricular ejection fraction, by visual estimation, is 30 to 35%. The left ventricle has moderate to severely decreased function. There is no left ventricular hypertrophy.  2. Left ventricular diastolic parameters are consistent with Grade II diastolic dysfunction (pseudonormalization).  3. There is severe hypokinesis of the inferior and inferoseptal walls.  4. There is septal "bounce" suggestive of increased intrapericardial pressure or constriction.  5. Global right ventricle has moderately reduced systolic function.The right ventricular size is mildly enlarged. No increase in  right ventricular wall thickness.  6. Left atrial size was normal.  7. Right atrial size was mildly dilated.  8. Small pericardial effusion.  9. Mild to moderate mitral annular calcification. 10. The mitral valve is degenerative. Mild mitral valve regurgitation. No evidence of mitral stenosis. 11. The tricuspid valve is not well visualized. Tricuspid valve regurgitation mild-moderate. 12. The aortic valve is tricuspid. Aortic valve regurgitation is not visualized. Mild to moderate aortic valve sclerosis/calcification without any evidence of aortic stenosis. 13. The pulmonic valve was not well visualized. Pulmonic valve regurgitation is not visualized. 14. Mildly dilated pulmonary artery. 15. Mildly elevated pulmonary artery systolic pressure. 16. The inferior vena  cava is dilated in size with <50% respiratory variability, suggesting right atrial pressure of 15 mmHg. _____________   Patient Profile     63 y.o. male with a history of hypertension, tobacco use, and admitted with Afib RVR, late presenting inferior ST elevation MI s/p unsuccessful PCI to RCA, ICM, AKI (? Chronicity of renal dzs), and L MCA territory stroke.  Assessment & Plan    1.  Acute inferior STEMI/CAD:   S/p emergent cath revealing severe RCA dzs.   PCI attempted and unsuccessful in the setting of diffuse dzs  medical therapy.  EF 30-35% w/ inferior HK.  Peak HsTrop 3729.   --Continue Plavix, statin, beta-blocker --cardiac rehab.  Holding aspirin in preparation for starting a NOAC this Sunday on December 13  2.  Acute L MCA territory stroke:   Followed by neurology,  3 -Paroxysmal atrial fibrillation, plan to start NOAC in 2 days on December 13  Remains in [place]     4.  ICM/HFrEF:   EF 30-35% w/ inf wma's.    No ACEI/ARB/ARNI/MRA 2/2 renal failure.   Volume status is OK   -Currently on carvedilol, isosorbide, hydralazine -Given he has no insurance , and BiDil is expensive , we will likely need to split up the  isosorbide and hydralazine and 2 separate medications  5.  Acute on chronic renal disease Stable around 1.9 Will need to be followed as outpatient  6.  Tob abuse:  Counselled on quitting   Total encounter time more than 25 minutes  Greater than 50% was spent in counseling and coordination of care with the patient  7  HL  COntinue lipitor  Signed, Dorris Carnes, MD  05/24/2019, 10:02 AM    For questions or updates, please contact   Please consult [email or web address] for contact info under Cardiology/STEMI.

## 2019-05-24 NOTE — Progress Notes (Signed)
CCMD notified of possible 1st degree heart block this morning. Cardiologist notified.

## 2019-05-24 NOTE — Progress Notes (Signed)
Occupational Therapy Treatment Patient Details Name: Claudis Matthes MRN: [id number] DOB: Jul 15, 1955 Today's Date: 05/24/2019    History of present illness Virgil Brandenburg is a 63yoM who comes to [facility] 12/7 c CP and gen weakness, notable HTN and AFcRVR, pt admitted c STEMI, HeadCT negaitve. Pt underwent a cardiac catheterization on 12/7 status post unsuccessful balloon angioplasty of the RCA due to diffuse disease, calcification and thrombus. Pt having expressive difficulty and motor control impairment of the right hemibody. Head CT/MRI showing acute CVA. PTA pt was fully independent, retired but looking ino to working part time in the future.   OT comments  Mr. Cackowski  Is a very pleasant and motivated 63 yrs old gentleman - willing to work with OT - pt supine in bed upon arrival. Denies any pain. Pt continues to be functionally limited by significant apraxia with his R side control with using  utensil, comb, donning sock. He  requires increased levels of assistance as activity demands increase. Pt required moderate assist to donning socks 2/2 decreased motor planning, fine motor coordination- doffing S but no LOB sitting at EOB.  Pt is making progress to OT goals and contines to be motivated t/o therapy sessions. Pt do get frustrated with communication and   when moving to harder task.  however, remains very motivated. Therapist provides consistent cueing to utilize compensatory dressing strategies, and pt return demonstrates. Pt continues to benefit from skilled OT services to maximize return to PLOF and minimize risk of future falls, injury, caregiver burden, and readmission. Continue to strongly recommend acute inpatient rehabilitative services upon hospital discharge. Will continue to follow POC. Frequency remains appropriate.   Follow Up Recommendations       Equipment Recommendations       Recommendations for Other Services      Precautions / Restrictions Precautions Precautions:  Fall Restrictions Weight Bearing Restrictions: No       Mobility Bed Mobility               General bed mobility comments: up with nursing upon arrival to bathroom and remained in chair after session.  Transfers Overall transfer level: Needs assistance Equipment used: Rolling walker (2 wheeled);None Transfers: Sit to/from Stand Sit to Stand: Min guard              Balance Overall balance assessment: Needs assistance Sitting-balance support: Feet supported Sitting balance-Leahy Scale: Good     Standing balance support: During functional activity;No upper extremity supported Standing balance-Leahy Scale: Fair                   Standardized Balance Assessment Standardized Balance Assessment : Berg Balance Test Berg Balance Test Sit to Stand: Able to stand  independently using hands Standing Unsupported: Able to stand 2 minutes with supervision Sitting with Back Unsupported but Feet Supported on Floor or Stool: Able to sit safely and securely 2 minutes Stand to Sit: Sits safely with minimal use of hands Transfers: Able to transfer with verbal cueing and /or supervision Standing Unsupported with Eyes Closed: Able to stand 10 seconds with supervision Standing Ubsupported with Feet Together: Able to place feet together independently and stand for 1 minute with supervision From Standing, Reach Forward with Outstretched Arm: Can reach forward >5 cm safely (2") From Standing Position, Pick up Object from Floor: Unable to pick up shoe, but reaches 2-5 cm (1-2") from shoe and balances independently From Standing Position, Turn to Look Behind Over each Shoulder: Turn sideways only but maintains balance Turn  360 Degrees: Needs close supervision or verbal cueing Standing Unsupported, Alternately Place Feet on Step/Stool: Needs assistance to keep from falling or unable to try Standing Unsupported, One Foot in Front: Needs help to step but can hold 15 seconds Standing on  One Leg: Unable to try or needs assist to prevent fall Total Score: 30       ADL either performed or assessed with clinical judgement   ADL                                               Vision       Perception     Praxis      Cognition Arousal/Alertness: Awake/alert Behavior During Therapy: [facility] for tasks assessed/performed(but get frustratad if not finding the correct word,) Overall Cognitive Status: Impaired/Different from baseline Area of Impairment: Safety/judgement;Following commands;Problem solving;Awareness                       Following Commands: Follows one step commands with increased time;Follows multi-step commands inconsistently Safety/Judgement: Decreased awareness of deficits;Decreased awareness of safety   Problem Solving: Difficulty sequencing;Requires tactile cues          Exercises Other Exercises Other Exercises: R and L , arm and leg - first time unable - but correct hmself 3/6 times Other Exercises: supine <> sit independend and supervision ; CG functional mobilty to bathroom - pt open door with R hand, need Assistance to pull up gown , urinating and hygiene S; hand washing at sink - SBA for use of soap and paper dispenser Other Exercises: sitting EOB - doffing socks independent, donning Mod A for getting in 4th and 5th toes, pt cannot get if over all toes and pull up with R hand( apraxia) - L hand able to Other Exercises: standing at sink - pt able to pick up comb and comb hair with R hand - but not able to get right angle  to comb hair - will do better with brush ?; unable to indentigy hot and cold water , but when point to red and blue - pt able to say hot and cold Other Exercises: Able to open 2 bottles tops with R hand and hold with L , Min A to close and over twist tops to fasten - when provided top on palm to manipulate from [place] <> tips of digits - hand closed in tight fist   Shoulder Instructions  During there ex for  UE - pt was able to follow functional movements  But had hard time with following direction to resist against OT for elbow flexion , extention      General Comments      Pertinent Vitals/ Pain       Pain Assessment: No/denies pain  Home Living Family/patient expects to be discharged to:: Private residence Living Arrangements: Alone                 Bathroom Shower/Tub: Occupational psychologist: Standard     Home Equipment: None      Lives With: Alone    Prior Functioning/Environment Level of Independence: Independent        Comments: no history of falls or balance issues;   Frequency  Min 3X/week        Progress Toward Goals  OT Goals(current goals can now be found  in the care plan section)     Acute Rehab OT Goals Patient Stated Goal: Want to get better and want to go to rehab for a lot of therapy OT Goal Formulation: With patient Time For Goal Achievement: 06/05/19 Potential to Achieve Goals: Good  Plan      Co-evaluation                 AM-PAC OT "6 Clicks" Daily Activity     Outcome Measure                    End of Session Equipment Utilized During Treatment: Gait belt  OT Visit Diagnosis: Other abnormalities of gait and mobility (R26.89);Hemiplegia and hemiparesis;Apraxia (R48.2);Feeding difficulties (R63.3) Hemiplegia - Right/Left: Right Hemiplegia - dominant/non-dominant: Dominant Hemiplegia - caused by: Cerebral infarction   Activity Tolerance Patient tolerated treatment well   Patient Left in bed;with bed alarm set;with call bell/phone within reach;with family/visitor present   Nurse Communication          Time: [id number] OT Time Calculation (min): 39 min  Charges: OT General Charges $OT Visit: 1 Visit OT Treatments $Self Care/Home Management : 23-37 mins $Therapeutic Activity: 8-22 mins  Progressing to goals    Rosalyn Gess OTR/L,CLT  05/24/2019, 1:53 PM

## 2019-05-25 LAB — BASIC METABOLIC PANEL
Anion gap: 8 (ref 5–15)
BUN: 29 mg/dL — ABNORMAL HIGH (ref 8–23)
CO2: 21 mmol/L — ABNORMAL LOW (ref 22–32)
Calcium: 8.6 mg/dL — ABNORMAL LOW (ref 8.9–10.3)
Chloride: 110 mmol/L (ref 98–111)
Creatinine, Ser: 1.77 mg/dL — ABNORMAL HIGH (ref 0.61–1.24)
GFR calc Af Amer: 46 mL/min — ABNORMAL LOW (ref >60)
GFR calc non Af Amer: 40 mL/min — ABNORMAL LOW (ref >60)
Glucose, Bld: 102 mg/dL — ABNORMAL HIGH (ref 70–99)
Potassium: 3.9 mmol/L (ref 3.5–5.1)
Sodium: 139 mmol/L (ref 135–145)

## 2019-05-25 NOTE — Progress Notes (Signed)
Physical Therapy Treatment Patient Details Name: Gordon [name] MRN: [id number] DOB: January 06, 1956 Today's Date: 05/25/2019    History of Present Illness Gordon [name] is a 39yoM who comes to [facility] 12/7 c CP and gen weakness, notable HTN and AFcRVR, pt admitted c STEMI, HeadCT negaitve. Pt underwent a cardiac catheterization on 12/7 status post unsuccessful balloon angioplasty of the RCA due to diffuse disease, calcification and thrombus. Pt having expressive difficulty and motor control impairment of the right hemibody. Head CT/MRI showing acute CVA. PTA pt was fully independent, retired but looking ino to working part time in the future.    PT Comments    Pt in room agreeable to participate with enthusiasm. Contiued to work on AMB tolerance, noted difficulty maintaing central RW alignment with body this date and resultant RLE kicking of RW during swing phase, pt unaware and unable to correct without cues. After cues, pt correction does not last very long, maybe 20ft before starting again. Working on multiplanar, unsupported AMB in room, pt is careful and patient, no LOB noted, but unsteadiness is obvious. Also working on 90 degree turns on cue in stance, pt struggling with laterality, but balance remains fairly intact. Pt is aware of balance difficulty, reluctant to engage in unstable AMB scenarios, but has poor awareness of RLE and RUE, c/o being unable to feed himself still. Pt also upset he continues to be left in chair for too long. Pt asks to take a bath today as he has not had a bath since admission- this is relayed to RN.  Follow Up Recommendations  SNF;Supervision for mobility/OOB;Supervision/Assistance - 24 hour(pt has been declined bu CIR, unsafe to be alone, RT apraxia precludes ADL independence)     Equipment Recommendations  Rolling walker with 5" wheels    Recommendations for Other Services       Precautions / Restrictions Precautions Precautions: Fall Restrictions Weight Bearing  Restrictions: No    Mobility  Bed Mobility Overal bed mobility: Needs Assistance Bed Mobility: Supine to Sit;Sit to Supine     Supine to sit: Supervision     General bed mobility comments: poor awareness of line management/safety  Transfers Overall transfer level: Needs assistance Equipment used: Rolling walker (2 wheeled);None Transfers: Sit to/from Stand Sit to Stand: Min guard         General transfer comment: cues for Rt foot awareness  Ambulation/Gait Ambulation/Gait assistance: Min assist Gait Distance (Feet): 480 Feet Assistive device: Rolling walker (2 wheeled) Gait Pattern/deviations: Step-through pattern Gait velocity: 0.67m/s   General Gait Details: intermittent cues to correct RW position; RW drifts left and pt begins to kick Rt back walker leg during Rt swing phase and is not aware or able to self correct   Stairs             Wheelchair Mobility    Modified Rankin (Stroke Patients Only)       Balance   Sitting-balance support: Feet supported;No upper extremity supported Sitting balance-Leahy Scale: Good       Standing balance-Leahy Scale: Fair Standing balance comment: RUE insufficient for self stabilization                            Cognition Arousal/Alertness: Awake/alert Behavior During Therapy: WFL for tasks assessed/performed Overall Cognitive Status: Impaired/Different from baseline Area of Impairment: Safety/judgement;Following commands;Problem solving;Awareness                       Following  Commands: Follows one step commands with increased time;Follows multi-step commands with increased time Safety/Judgement: Decreased awareness of deficits;Decreased awareness of safety Awareness: Emergent Problem Solving: Difficulty sequencing;Requires tactile cues General Comments: continues to have limited awareness of Rt limbs, limited sensation/proprioception.      Exercises Other Exercises Other Exercises:  side stepping in room 3x13ft bilat, no UE support, no RW, no LOB (pt is careful) Other Exercises: retro stepping without AD 1x53ft unsupported Other Exercises: 90degree turns on cue 1x10 randomized directions. Pt strugles with Lt/Rt cues and when given objects in room as target still has poor accuracy 20% of time.    General Comments        Pertinent Vitals/Pain Pain Assessment: No/denies pain    Home Living                      Prior Function            PT Goals (current goals can now be found in the care plan section) Acute Rehab PT Goals Patient Stated Goal: Want to get better and want to go to rehab for a lot of therapy PT Goal Formulation: With patient Time For Goal Achievement: 06/04/19 Potential to Achieve Goals: Fair Progress towards PT goals: Progressing toward goals    Frequency    7X/week      PT Plan Current plan remains appropriate    Co-evaluation              AM-PAC PT "6 Clicks" Mobility   Outcome Measure  Help needed turning from your back to your side while in a flat bed without using bedrails?: A Little Help needed moving from lying on your back to sitting on the side of a flat bed without using bedrails?: A Little Help needed moving to and from a bed to a chair (including a wheelchair)?: A Little Help needed standing up from a chair using your arms (e.g., wheelchair or bedside chair)?: A Little Help needed to walk in hospital room?: A Little Help needed climbing 3-5 steps with a railing? : A Little 6 Click Score: 18    End of Session Equipment Utilized During Treatment: Gait belt Activity Tolerance: Patient tolerated treatment well;No increased pain Patient left: with call bell/phone within reach;in bed;with bed alarm set(Pt keeps getting left in chair for longer than he would like adn is reluctant to go to chair.) Nurse Communication: Mobility status;Other (comment) PT Visit Diagnosis: Unsteadiness on feet (R26.81);Difficulty in  walking, not elsewhere classified (R26.2);Apraxia (R48.2);Ataxic gait (R26.0);Other abnormalities of gait and mobility (R26.89)     Time: [id number] PT Time Calculation (min) (ACUTE ONLY): 20 min  Charges:  $Neuromuscular Re-education: 8-22 mins                     1:54 PM, 05/25/19 Etta Grandchild, PT, DPT Physical Therapist - [facility]  [phone] ([place])    Buccola,Allan C 05/25/2019, 1:50 PM

## 2019-05-25 NOTE — Progress Notes (Signed)
Progress Note  Patient Name: Gordon [name] Date of Encounter: 05/25/2019  Primary Cardiologist: Kathlyn Sacramento, MD  Subjective   Pt comfortable  Talkative  Denies CP   Breathing is OK    Inpatient Medications    Scheduled Meds: . apixaban  5 mg Oral BID  . atorvastatin  40 mg Oral q1800  . carvedilol  3.125 mg Oral BID WC  . Chlorhexidine Gluconate Cloth  6 each Topical Daily  . clopidogrel  75 mg Oral Daily  . feeding supplement (ENSURE ENLIVE)  237 mL Oral BID BM  . hydrALAZINE  25 mg Oral TID  . isosorbide mononitrate  60 mg Oral Daily  . sodium chloride flush  3 mL Intravenous Q12H   Continuous Infusions: . sodium chloride     PRN Meds: sodium chloride, acetaminophen, ondansetron (ZOFRAN) IV, sodium chloride flush   Vital Signs    Vitals:   05/24/19 1714 05/24/19 2000 05/25/19 0402 05/25/19 0736  BP: (!) 143/89 137/84 130/79 122/88  Pulse: 73 76 (!) 55 69  Resp: (!) 21 19 20 19   Temp: 98.1 F (36.7 C) 98.5 F (36.9 C) 98.4 F (36.9 C) 97.9 F (36.6 C)  TempSrc: Oral Oral Oral Oral  SpO2: 99% 98% 98% 98%  Weight:   73.1 kg   Height:        Intake/Output Summary (Last 24 hours) at 05/25/2019 0943 Last data filed at 05/25/2019 0648 Gross per 24 hour  Intake --  Output 125 ml  Net -125 ml   Filed Weights   05/23/19 0518 05/24/19 0533 05/25/19 0402  Weight: 75 kg 74.4 kg 73.1 kg    Physical Exam   Constitutional:  Pt in NAD Neck: JVP is normal  Cardiovascular: Regular rate and rhythm, no murmurs appreciated Pulmonary/Chest: Clear to auscultation bilaterally, no wheezes or rales   Abdominal: Soft.  no distension.  no tenderness.  Musculoskeletal: Normal range of motion Neurological:  Deferred Skin: Skin warm and dry    Labs    Chemistry Recent Labs  Lab 05/19/19 1026 05/20/19 0408 05/21/19 0427 05/23/19 0850 05/25/19 0440  NA 135 135 137 137 139  K 3.2* 4.2 3.8 4.2 3.9  CL 104 107 108 109 110  CO2 16* 19* 18* 20* 21*  GLUCOSE  155* 107* 89 111* 102*  BUN 32* 32* 30* 28* 29*  CREATININE 1.78* 1.95* 2.01* 1.94* 1.77*  CALCIUM 8.5* 8.2* 8.1* 8.5* 8.6*  PROT 6.5 5.9*  --   --   --   ALBUMIN 2.9* 2.6*  --   --   --   AST 21 22  --   --   --   ALT 16 18  --   --   --   ALKPHOS 76 67  --   --   --   BILITOT 1.0 1.0  --   --   --   GFRNONAA 39* 35* 34* 36* 40*  GFRAA 46* 41* 39* 41* 46*  ANIONGAP 15 9 11 8 8      Hematology Recent Labs  Lab 05/19/19 1026 05/20/19 0408 05/21/19 0425  WBC 11.7* 11.2* 10.2  RBC 4.41 4.06* 3.98*  HGB 13.3 12.2* 12.0*  HCT 38.3* 35.8* 36.4*  MCV 86.8 88.2 91.5  MCH 30.2 30.0 30.2  MCHC 34.7 34.1 33.0  RDW 13.5 13.8 14.1  PLT 304 282 278    Cardiac Enzymes  Recent Labs  Lab 05/19/19 1026 05/19/19 1446 05/19/19 1720  TROPONINIHS 3,929* [id number]* 3,729*  Radiology    CT HEAD WO CONTRAST  Result Date: 05/23/2019 CLINICAL DATA:  Follow-up stroke EXAM: CT HEAD WITHOUT CONTRAST TECHNIQUE: Contiguous axial images were obtained from the base of the skull through the vertex without intravenous contrast. COMPARISON:  MRI 05/21/2019.  CT 05/19/2019. FINDINGS: Brain: Discrete low-density now evident in the region of acute infarction within the left MCA territory affecting the deep insula and frontoparietal cortical and subcortical brain. Mild swelling but no evidence of mass effect or hemorrhage. No other acute or subacute infarction identified. Chronic small-vessel ischemic changes affect the cerebral hemispheric white matter elsewhere. Chronic small-vessel changes of the pons. No mass, hydrocephalus or extra-axial collection. Vascular: There is atherosclerotic calcification of the major vessels at the base of the brain. Skull: Negative Sinuses/Orbits: Clear/normal Other: None IMPRESSION: Discrete low-density now evident at CT in the region of acute infarction in the left MCA territory affecting the deep insula and frontoparietal brain. No mass effect or hemorrhagic transformation.  Electronically Signed   By: Nelson Chimes M.D.   On: 05/23/2019 10:36    Telemetry    SB/SR  - Personally Reviewed  Cardiac Studies   Cardiac Catheterization and Percutaneous Coronary Intervention 12.7.2020  Left Anterior Descending  Ost LAD to Prox LAD lesion 30% stenosed  Ost LAD to Prox LAD lesion is 30% stenosed.  Prox LAD to Mid LAD lesion 30% stenosed  Prox LAD to Mid LAD lesion is 30% stenosed.  Mid LAD to Dist LAD lesion 60% stenosed  Mid LAD to Dist LAD lesion is 60% stenosed.  Left Circumflex  There is mild diffuse disease throughout the vessel.  Left Posterior Atrioventricular Artery  The vessel exhibits minimal luminal irregularities.  Right Coronary Artery  Prox RCA to Mid RCA lesion 100% stenosed  Prox RCA to Mid RCA lesion is 100% stenosed. Vessel is the culprit lesion. The lesion is type C and thrombotic with left-to-right collateral flow. The lesion is severely calcified.  Right Posterior Descending Artery  Collaterals  RPDA filled by collaterals from Dist LAD.    RPDA lesion 90% stenosed  RPDA lesion is 90% stenosed.  EF of 30 to 35% with akinesis of the inferior wall.   **Unsuccessful balloon angioplasty of the right coronary artery given diffuse disease, calcifications and thrombus. This was in spite of multiple balloon inflations throughout the whole vessel.  _____________   2D Echocardiogram    1. Left ventricular ejection fraction, by visual estimation, is 30 to 35%. The left ventricle has moderate to severely decreased function. There is no left ventricular hypertrophy.  2. Left ventricular diastolic parameters are consistent with Grade II diastolic dysfunction (pseudonormalization).  3. There is severe hypokinesis of the inferior and inferoseptal walls.  4. There is septal "bounce" suggestive of increased intrapericardial pressure or constriction.  5. Global right ventricle has moderately reduced systolic function.The right ventricular size is mildly  enlarged. No increase in right ventricular wall thickness.  6. Left atrial size was normal.  7. Right atrial size was mildly dilated.  8. Small pericardial effusion.  9. Mild to moderate mitral annular calcification. 10. The mitral valve is degenerative. Mild mitral valve regurgitation. No evidence of mitral stenosis. 11. The tricuspid valve is not well visualized. Tricuspid valve regurgitation mild-moderate. 12. The aortic valve is tricuspid. Aortic valve regurgitation is not visualized. Mild to moderate aortic valve sclerosis/calcification without any evidence of aortic stenosis. 13. The pulmonic valve was not well visualized. Pulmonic valve regurgitation is not visualized. 14. Mildly dilated pulmonary artery. 15.  Mildly elevated pulmonary artery systolic pressure. 16. The inferior vena cava is dilated in size with <50% respiratory variability, suggesting right atrial pressure of 15 mmHg. _____________   Patient Profile     63 y.o. male with a history of hypertension, tobacco use, and admitted with Afib RVR, late presenting inferior ST elevation MI s/p unsuccessful PCI to RCA, ICM, AKI (? Chronicity of renal dzs), and L MCA territory stroke.  Assessment & Plan    1.  Acute inferior STEMI/CAD:   S/p emergent cath revealing severe RCA dzs.   PCI attempted and unsuccessful in the setting of diffuse dzs  medical therapy.  EF 30-35% w/ inferior HK.  Peak HsTrop 3729.   --Continue Plavix, statin, beta-blocker, hydralazine /NTG   --cardiac rehab.  No ASA as starting Eliquis  2.  Acute L MCA territory stroke:   Followed by neurology,  3 -Paroxysmal atrial fibrillation  NOne on monitor  Starting Eliquis today     4.  ICM/HFrEF:   EF 30-35% w/ inf wma's.    No ACEI/ARB/ARNI/MRA 2/2 renal failure.   Volume status is good    -Currently on carvedilol, isosorbide, hydralazine  Keep on same doses for  Now    5.  Acute on chronic renal disease   Cr better at 1.77    6.  Tob abuse:   Counselled on quitting    7  HL  Keep on lipitor  Pt OK for D/C from cardiac  Standpoint  Will need close outpt f/u   Total encounter time more than 25 minutes  Greater than 50% was spent in counseling and coordination of care with the patient    Signed, Dorris Carnes, MD  05/25/2019, 9:43 AM    For questions or updates, please contact   Please consult [email or web address] for contact info under Cardiology/STEMI.

## 2019-05-25 NOTE — Plan of Care (Signed)

## 2019-05-25 NOTE — Plan of Care (Signed)
  Problem: Education: Goal: Knowledge of General Education information will improve Description: Including pain rating scale, medication(s)/side effects and non-pharmacologic comfort measures Outcome: Progressing   Problem: Health Behavior/Discharge Planning: Goal: Ability to manage health-related needs will improve Outcome: Not Progressing Note: Catheterization with potential PCI unsuccessful days ago, patient also suffered a CVA (discovered in house). Patient worked with PT already today. Will continue to monitor overall progression on 6th day of admission. Wenda Low [facility]

## 2019-05-25 NOTE — Progress Notes (Signed)
Patient ID: Gordon [name], male   DOB: [birth year], 63 y.o.   MRN: [id number] [facility] PROGRESS NOTE  Jimel Bransford [id number] DOB: 10/29/1955 DOA: 05/19/2019 PCP: System, Pcp Not In  HPI/Subjective: Patient still has quite a bit of incoordination with his right hand.  His strength is okay with his arm and his leg but the incoordination still present.  Still very frustrated when he cannot get out the words that he wants to say.  Objective: Vitals:   05/25/19 0402 05/25/19 0736  BP: 130/79 122/88  Pulse: (!) 55 69  Resp: 20 19  Temp: 98.4 F (36.9 C) 97.9 F (36.6 C)  SpO2: 98% 98%    Filed Weights   05/23/19 0518 05/24/19 0533 05/25/19 0402  Weight: 75 kg 74.4 kg 73.1 kg    ROS: Review of Systems  Constitutional: Negative for chills and fever.  Eyes: Negative for blurred vision.  Respiratory: Negative for cough and shortness of breath.   Cardiovascular: Negative for chest pain.  Gastrointestinal: Negative for abdominal pain, constipation, diarrhea, nausea and vomiting.  Genitourinary: Negative for dysuria.  Musculoskeletal: Negative for joint pain.  Neurological: Positive for focal weakness. Negative for dizziness and headaches.   Exam: Physical Exam  HENT:  Nose: No mucosal edema.  Mouth/Throat: No oropharyngeal exudate or posterior oropharyngeal edema.  Eyes: Pupils are equal, round, and reactive to light. Conjunctivae, EOM and lids are normal.  Neck: No JVD present. Carotid bruit is not present. No thyroid mass and no thyromegaly present.  Cardiovascular: S1 normal and S2 normal. Exam reveals no gallop.  No murmur heard. Pulses:      Dorsalis pedis pulses are 2+ on the right side and 2+ on the left side.  Respiratory: No respiratory distress. He has no wheezes. He has no rhonchi. He has no rales.  GI: Soft. Bowel sounds are normal. There is no abdominal tenderness.  Musculoskeletal:     Cervical back: No edema.     Right ankle: No swelling.     Left ankle:  No swelling.  Lymphadenopathy:    He has no cervical adenopathy.  Neurological: He is alert. No cranial nerve deficit.  Power 5 out of 5 upper and lower extremities but Coordination off with his right hand and arm.  Patient unable to do rapid finger movements with either hand.  Worse with the right.  Skin: Skin is warm. No rash noted. Nails show no clubbing.  Psychiatric:  Slow with some responses.      Data Reviewed: Basic Metabolic Panel: Recent Labs  Lab 05/19/19 1026 05/20/19 0408 05/21/19 0427 05/23/19 0850 05/25/19 0440  NA 135 135 137 137 139  K 3.2* 4.2 3.8 4.2 3.9  CL 104 107 108 109 110  CO2 16* 19* 18* 20* 21*  GLUCOSE 155* 107* 89 111* 102*  BUN 32* 32* 30* 28* 29*  CREATININE 1.78* 1.95* 2.01* 1.94* 1.77*  CALCIUM 8.5* 8.2* 8.1* 8.5* 8.6*   Liver Function Tests: Recent Labs  Lab 05/19/19 1026 05/20/19 0408  AST 21 22  ALT 16 18  ALKPHOS 76 67  BILITOT 1.0 1.0  PROT 6.5 5.9*  ALBUMIN 2.9* 2.6*   CBC: Recent Labs  Lab 05/19/19 1026 05/20/19 0408 05/21/19 0425  WBC 11.7* 11.2* 10.2  HGB 13.3 12.2* 12.0*  HCT 38.3* 35.8* 36.4*  MCV 86.8 88.2 91.5  PLT 304 282 278    CBG: Recent Labs  Lab 05/19/19 1443  GLUCAP 100*    Recent Results (from the past 240  hour(s))  SARS Coronavirus 2 by RT PCR (hospital order, performed in [facility] hospital lab) Nasopharyngeal Nasopharyngeal Swab     Status: None   Collection Time: 05/19/19 12:40 PM   Specimen: Nasopharyngeal Swab  Result Value Ref Range Status   SARS Coronavirus 2 NEGATIVE NEGATIVE Final    Comment: (NOTE) SARS-CoV-2 target nucleic acids are NOT DETECTED. The SARS-CoV-2 RNA is generally detectable in upper and lower respiratory specimens during the acute phase of infection. The lowest concentration of SARS-CoV-2 viral copies this assay can detect is 250 copies / mL. A negative result does not preclude SARS-CoV-2 infection and should not be used as the sole basis for treatment or  other patient management decisions.  A negative result may occur with improper specimen collection / handling, submission of specimen other than nasopharyngeal swab, presence of viral mutation(s) within the areas targeted by this assay, and inadequate number of viral copies (<250 copies / mL). A negative result must be combined with clinical observations, patient history, and epidemiological information. Fact Sheet for Patients:   StrictlyIdeas.no Fact Sheet for Healthcare Providers: BankingDealers.co.za This test is not yet approved or cleared  by the [place] FDA and has been authorized for detection and/or diagnosis of SARS-CoV-2 by FDA under an Emergency Use Authorization (EUA).  This EUA will remain in effect (meaning this test can be used) for the duration of the COVID-19 declaration under Section 564(b)(1) of the Act, 21 U.S.C. section 360bbb-3(b)(1), unless the authorization is terminated or revoked sooner. Performed at [facility], [address] [place], [place] [address]   MRSA PCR Screening     Status: None   Collection Time: 05/19/19  3:57 PM   Specimen: Nasal Mucosa; Nasopharyngeal  Result Value Ref Range Status   MRSA by PCR NEGATIVE NEGATIVE Final    Comment:        The GeneXpert MRSA Assay (FDA approved for NASAL specimens only), is one component of a comprehensive MRSA colonization surveillance program. It is not intended to diagnose MRSA infection nor to guide or monitor treatment for MRSA infections. Performed at [facility], [address] [place], [place] [address]       Scheduled Meds: . apixaban  5 mg Oral BID  . atorvastatin  40 mg Oral q1800  . carvedilol  3.125 mg Oral BID WC  . Chlorhexidine Gluconate Cloth  6 each Topical Daily  . clopidogrel  75 mg Oral Daily  . feeding supplement (ENSURE ENLIVE)  237 mL Oral BID BM  . hydrALAZINE  25 mg Oral TID  . isosorbide  mononitrate  60 mg Oral Daily  . sodium chloride flush  3 mL Intravenous Q12H   Continuous Infusions: . sodium chloride      Assessment/Plan:  1. 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  Repeat CT scan did not show any hemorrhage.  Patient having difficulty with processing words and saying what he wants to say.  Patient also has incoordination of the right arm.  I am still concerned about the patient living home alone.  Patient started on Eliquis today and is also on Plavix. 2. STEMI.  Medical management with Plavix, atorvastatin and Coreg. 3. Hyperlipidemia.  LDL 75.  Goal less than 70.  Atorvastatin prescribed 4. Chronic kidney disease stage IIIb.  Creatinine actually improving down to 1.77 today. 5. Paroxysmal atrial fibrillation.  Started on Eliquis today. 6. Cardiomyopathy with chronic systolic congestive heart failure.  No ACE or  ARB with kidney injury.  No spironolactone with kidney injury.  On beta-blocker only.  Code Status:     Code Status Orders  (From admission, onward)         Start     Ordered   05/19/19 1459  Full code  Continuous     05/19/19 1459        Code Status History    This patient has a current code status but no historical code status.   Advance Care Planning Activity     Family Communication: Spoke with sister on the phone yesterday Disposition Plan: Concerned about patient living home alone.  Consultants:  Cardiology  Neurology  Time spent: 26 minutes.  [place]  [facility] [employer]

## 2019-05-26 DIAGNOSIS — I213 ST elevation (STEMI) myocardial infarction of unspecified site: Secondary | ICD-10-CM

## 2019-05-26 LAB — BASIC METABOLIC PANEL
Anion gap: 10 (ref 5–15)
BUN: 28 mg/dL — ABNORMAL HIGH (ref 8–23)
CO2: 21 mmol/L — ABNORMAL LOW (ref 22–32)
Calcium: 8.4 mg/dL — ABNORMAL LOW (ref 8.9–10.3)
Chloride: 107 mmol/L (ref 98–111)
Creatinine, Ser: 1.66 mg/dL — ABNORMAL HIGH (ref 0.61–1.24)
GFR calc Af Amer: 50 mL/min — ABNORMAL LOW (ref >60)
GFR calc non Af Amer: 43 mL/min — ABNORMAL LOW (ref >60)
Glucose, Bld: 91 mg/dL (ref 70–99)
Potassium: 4 mmol/L (ref 3.5–5.1)
Sodium: 138 mmol/L (ref 135–145)

## 2019-05-26 MED ORDER — METOPROLOL TARTRATE 25 MG PO TABS
25.0000 mg | ORAL_TABLET | Freq: Four times a day (QID) | ORAL | Status: DC
  Administered 2019-05-26 – 2019-05-28 (×8): 25 mg via ORAL
  Filled 2019-05-26 (×8): qty 1

## 2019-05-26 MED ORDER — CARVEDILOL 6.25 MG PO TABS
6.2500 mg | ORAL_TABLET | Freq: Two times a day (BID) | ORAL | Status: DC
  Administered 2019-05-26: 14:00:00 3.125 mg via ORAL
  Filled 2019-05-26: qty 1

## 2019-05-26 MED ORDER — MAGNESIUM SULFATE 2 GM/50ML IV SOLN
2.0000 g | Freq: Once | INTRAVENOUS | Status: AC
  Administered 2019-05-26: 2 g via INTRAVENOUS
  Filled 2019-05-26: qty 50

## 2019-05-26 MED ORDER — METOPROLOL TARTRATE 5 MG/5ML IV SOLN
5.0000 mg | INTRAVENOUS | Status: DC | PRN
  Administered 2019-05-26 (×3): 5 mg via INTRAVENOUS
  Filled 2019-05-26 (×3): qty 5

## 2019-05-26 NOTE — Progress Notes (Signed)
Patient's heart rate in the 130s-140s sustaining. Patient sitting up in the chair without any complaints. Dr. Metro Kung notified, new orders put in. Verbal orders to administer 3.125 mg of coreg now instead of the full 6.25mg  that he just changed to because he had is morning dose of 3.125mg  already. So will administer 3.125mg  now.

## 2019-05-26 NOTE — Progress Notes (Signed)
PT Cancellation Note  Patient Details Name: Gordon [name] MRN: [id number] DOB: 04-16-1956   Cancelled Treatment:    Reason Eval/Treat Not Completed: Medical issues which prohibited therapy(Chart reviewed, pt having issues with sustained tachycardia this date. On unit, author notes rates remain in upper 120s. Will hold treatment at this time, resume once appropriate.)  3:15 PM, 05/26/19 Etta Grandchild, PT, DPT Physical Therapist - [facility]  [phone] ([place])     [place] 05/26/2019, 3:14 PM

## 2019-05-26 NOTE — Plan of Care (Signed)
  Problem: Clinical Measurements: ?Goal: Diagnostic test results will improve ?Outcome: Progressing ?Goal: Cardiovascular complication will be avoided ?Outcome: Progressing ?  ?Problem: Safety: ?Goal: Ability to remain free from injury will improve ?Outcome: Progressing ?  ?

## 2019-05-26 NOTE — Progress Notes (Addendum)
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 This significantly impacts his ability to verbally communicate in functional communication/ADLs. Pt also exhibits impaired proprioception and differentiation of R/L in tasks. He is able to read at the Word Level aloud but again hampered by expressive deficits when the task becomes verbal in nature. Pt's expressive language was c/b by deficits of phonemic and semantic paraphasias; Apraxia and groping w/ increased motor behaviors of limbs/body; paraphasias impacting intelligibility/understanding of pt's intention. Pt was mostly aware of his speech errors and would immediately try to self-correct but when unable to, he became frustrated. He often stated "I'm going to try to do/say that" in a clear automatic statement when he was presented w/ a task/question. No Dysarthria noted; speech clear in greetings; spontaneous speech. Patient demonstrates significant improvement in verbal expression. Pt is communicating w/ less paraphasias at the short phrase level; less frustration noted by pt/SLP as well. Object naming accuracy was improved; even tackled object function as another expressive task. Strategy of a starter phrase "this is a ____" yielded positive responses. Opposite word tasks were quite accurate at the functional level. Attempted sentence completion tasks which were ~50% accurate. Discussed other strategies learned in previous sessions; pt is responsive to strategies of gestural cues, pantomime self-cuing, semantic cues, reading carrier phrase, reading written word, and phonemic cues. Discussed his frustration w/ his speech and calming strategies  including deep breath and beginning is response again. Also instructed pt to "move on" from a word/phrase that he just cannot say at the moment -- the frustration makes it even "harder" to say at that point. Pt stated he is "going to stay positive" and "this is going to get better" (few phonemic paraphasias intermittently).  Given his positive progress to date and responsiveness to cueing, pt would be expected to continue to make significant gains in communication with intensive therapy, preferable inpatient rehab setting. Recommend further skilled ST services for ongoing assessment of Aphasia and Apraixa; therapy and teaching of strategies in order to maximize functional communication, safety, and independence thus reduce caregiver burden and to maximize communication and return to PLOF.      HPI  Pt is a 63yo male who comes to [facility] 12/7  w/ c/o CP and weakness, notable HTN and AFcRVR. Pt endorsed s/s of issues including chest pain since Thanksgiving; code STEMI initiated by MDs. Head CT negaitve. Pt underwent a cardiac catheterization on 12/7 status post unsuccessful balloon angioplasty of the RCA due to diffuse disease, calcification and thrombus. Pt having expressive difficulty and motor control impairment of the right hemibody post precedure. Head CT/MRI following revealed acute CVA. PTA pt was fully independent, retired. Pt verbally engaging and eager to attempt communication but is quite hampered by the expressive deficits. Speech clear.       SLP Plan  Continue with current plan of care       Recommendations    2-3x week; x2 weeks                General recommendations: Rehab consult Follow up Recommendations: Inpatient Rehab(vs SNF) SLP Visit Diagnosis: Aphasia (R47.01);Apraxia (R48.2) Plan: Continue with current plan of care       GO  [name], MS, CCC-SLP Gordon [name] 05/26/2019, 3:19 PM

## 2019-05-26 NOTE — Progress Notes (Signed)
Subjective: Patient with no new complaints.    Objective: Current vital signs: BP (!) 155/90 (BP Location: Right Arm)   Pulse 78   Temp 98.7 F (37.1 C) (Oral)   Resp 18   Ht 5\' 8"  (1.727 m)   Wt 73.5 kg   SpO2 98%   BMI 24.65 kg/m  Vital signs in last 24 hours: Temp:  [98.3 F (36.8 C)-99 F (37.2 C)] 98.7 F (37.1 C) (12/14 0838) Pulse Rate:  [78-83] 78 (12/14 0838) Resp:  [18-19] 18 (12/14 0838) BP: (128-155)/(73-90) 155/90 (12/14 0838) SpO2:  [94 %-99 %] 98 % (12/14 0838) Weight:  [73.5 kg] 73.5 kg (12/14 0547)  Intake/Output from previous day: 12/13 0701 - 12/14 0700 In: -  Out: 700 [Urine:700] Intake/Output this shift: Total I/O In: 363 [P.O.:360; I.V.:3] Out: -  Nutritional status:  Diet Order            Diet Heart Room service appropriate? Yes; Fluid consistency: Thin  Diet effective now              Neurologic Exam: Mental Status: Alert, oriented, thought content appropriate.  Speech nonfluent.  Able to follow some simple commands but requires some reinforcement for 3-step commands. Cranial Nerves: II: Discs flat bilaterally; Visual fields grossly normal, pupils equal, round, reactive to light and accommodation III,IV, VI: ptosis not present, extra-ocular motions intact bilaterally V,VII: smile symmetric, facial light touch sensation normal bilaterally VIII: hearing normal bilaterally IX,X: gag reflex present XI: bilateral shoulder shrug XII: midline tongue extension Motor: RUE drift   Lab Results: Basic Metabolic Panel: Recent Labs  Lab 05/20/19 0408 05/21/19 0427 05/23/19 0850 05/25/19 0440 05/26/19 0530  NA 135 137 137 139 138  K 4.2 3.8 4.2 3.9 4.0  CL 107 108 109 110 107  CO2 19* 18* 20* 21* 21*  GLUCOSE 107* 89 111* 102* 91  BUN 32* 30* 28* 29* 28*  CREATININE 1.95* 2.01* 1.94* 1.77* 1.66*  CALCIUM 8.2* 8.1* 8.5* 8.6* 8.4*    Liver Function Tests: Recent Labs  Lab 05/20/19 0408  AST 22  ALT 18  ALKPHOS 67  BILITOT 1.0   PROT 5.9*  ALBUMIN 2.6*   No results for input(s): LIPASE, AMYLASE in the last 168 hours. No results for input(s): AMMONIA in the last 168 hours.  CBC: Recent Labs  Lab 05/20/19 0408 05/21/19 0425  WBC 11.2* 10.2  HGB 12.2* 12.0*  HCT 35.8* 36.4*  MCV 88.2 91.5  PLT 282 278    Cardiac Enzymes: No results for input(s): CKTOTAL, CKMB, CKMBINDEX, TROPONINI in the last 168 hours.  Lipid Panel: Recent Labs  Lab 05/20/19 0408  CHOL 108  TRIG 54  HDL 22*  CHOLHDL 4.9  VLDL 11  LDLCALC 75    CBG: Recent Labs  Lab 05/19/19 1443  GLUCAP 100*    Microbiology: Results for orders placed or performed during the hospital encounter of 05/19/19  SARS Coronavirus 2 by RT PCR (hospital order, performed in [facility] hospital lab) Nasopharyngeal Nasopharyngeal Swab     Status: None   Collection Time: 05/19/19 12:40 PM   Specimen: Nasopharyngeal Swab  Result Value Ref Range Status   SARS Coronavirus 2 NEGATIVE NEGATIVE Final    Comment: (NOTE) SARS-CoV-2 target nucleic acids are NOT DETECTED. The SARS-CoV-2 RNA is generally detectable in upper and lower respiratory specimens during the acute phase of infection. The lowest concentration of SARS-CoV-2 viral copies this assay can detect is 250 copies / mL. A negative result does  not preclude SARS-CoV-2 infection and should not be used as the sole basis for treatment or other patient management decisions.  A negative result may occur with improper specimen collection / handling, submission of specimen other than nasopharyngeal swab, presence of viral mutation(s) within the areas targeted by this assay, and inadequate number of viral copies (<250 copies / mL). A negative result must be combined with clinical observations, patient history, and epidemiological information. Fact Sheet for Patients:   StrictlyIdeas.no Fact Sheet for Healthcare Providers: BankingDealers.co.za This test  is not yet approved or cleared  by the [place] FDA and has been authorized for detection and/or diagnosis of SARS-CoV-2 by FDA under an Emergency Use Authorization (EUA).  This EUA will remain in effect (meaning this test can be used) for the duration of the COVID-19 declaration under Section 564(b)(1) of the Act, 21 U.S.C. section 360bbb-3(b)(1), unless the authorization is terminated or revoked sooner. Performed at [facility], [address] [place], [place] [address]   MRSA PCR Screening     Status: None   Collection Time: 05/19/19  3:57 PM   Specimen: Nasal Mucosa; Nasopharyngeal  Result Value Ref Range Status   MRSA by PCR NEGATIVE NEGATIVE Final    Comment:        The GeneXpert MRSA Assay (FDA approved for NASAL specimens only), is one component of a comprehensive MRSA colonization surveillance program. It is not intended to diagnose MRSA infection nor to guide or monitor treatment for MRSA infections. Performed at [facility], [address] [place], [place] [address]     Coagulation Studies: No results for input(s): LABPROT, INR in the last 72 hours.  Imaging: No results found.  Medications:  I have reviewed the patient's current medications. Scheduled: . apixaban  5 mg Oral BID  . atorvastatin  40 mg Oral q1800  . carvedilol  3.125 mg Oral BID WC  . Chlorhexidine Gluconate Cloth  6 each Topical Daily  . clopidogrel  75 mg Oral Daily  . feeding supplement (ENSURE ENLIVE)  237 mL Oral BID BM  . hydrALAZINE  25 mg Oral TID  . isosorbide mononitrate  60 mg Oral Daily  . sodium chloride flush  3 mL Intravenous Q12H    Assessment/Plan: 63 y.o. male without known past medical history, on no medications as an outpatient and no primary physician admitted to the hospital for substernal chest pian.  He was found to be in A. fib with RVR and there was concern for STEMI. On admission he also reportedly had intermittent confusion. He  underwent a cardiac catheterization on 12/7 status post unsuccessful balloon angioplasty of the RCA due to diffuse disease, calcification and thrombus. He was also found to have moderately to severely reduced LV systolic function with an EF of 30-35% with inferior wall akinesis. Difficulty expressing himself around the time post cath.  Found to have left MCA infarct on imaging.  Started back on Eliquis.  Neurological exam improving.    Recommendation: 1. Agree with continued Eliquis   LOS: 7 days   Alexis Goodell, MD Neurology [phone] 05/26/2019  10:27 AM

## 2019-05-26 NOTE — Progress Notes (Signed)
Patient ID: Gordon [name], male   DOB: [birth year], 63 y.o.   MRN: [id number] [facility] PROGRESS NOTE  Gordon [name] [id number] DOB: 07/14/1955 DOA: 05/19/2019 PCP: System, Pcp Not In  HPI/Subjective: Patient still with difficulty getting his words out as he would like.  Still with a lot of incoordination with his right arm.  Patient offers no other complaints at this time.  Objective: Vitals:   05/26/19 0547 05/26/19 0838  BP: 128/75 (!) 155/90  Pulse: 78 78  Resp:  18  Temp: 99 F (37.2 C) 98.7 F (37.1 C)  SpO2: 94% 98%    Filed Weights   05/24/19 0533 05/25/19 0402 05/26/19 0547  Weight: 74.4 kg 73.1 kg 73.5 kg    ROS: Review of Systems  Constitutional: Negative for chills and fever.  Eyes: Negative for blurred vision.  Respiratory: Negative for cough and shortness of breath.   Cardiovascular: Negative for chest pain.  Gastrointestinal: Negative for abdominal pain, constipation, diarrhea, nausea and vomiting.  Genitourinary: Negative for dysuria.  Musculoskeletal: Negative for joint pain.  Neurological: Positive for focal weakness. Negative for dizziness and headaches.   Exam: Physical Exam  HENT:  Nose: No mucosal edema.  Mouth/Throat: No oropharyngeal exudate or posterior oropharyngeal edema.  Eyes: Pupils are equal, round, and reactive to light. Conjunctivae, EOM and lids are normal.  Neck: No JVD present. Carotid bruit is not present. No thyroid mass and no thyromegaly present.  Cardiovascular: S1 normal and S2 normal. Exam reveals no gallop.  No murmur heard. Pulses:      Dorsalis pedis pulses are 2+ on the right side and 2+ on the left side.  Respiratory: No respiratory distress. He has no wheezes. He has no rhonchi. He has no rales.  GI: Soft. Bowel sounds are normal. There is no abdominal tenderness.  Musculoskeletal:     Cervical back: No edema.     Right ankle: No swelling.     Left ankle: No swelling.  Lymphadenopathy:    He has no cervical  adenopathy.  Neurological: He is alert. No cranial nerve deficit.  Power 5 out of 5 upper and lower extremities but Coordination off with his right hand and arm.  Patient unable to do rapid finger movements with either hand.  Worse with the right.  Skin: Skin is warm. No rash noted. Nails show no clubbing.  Psychiatric:  Slow with some responses.      Data Reviewed: Basic Metabolic Panel: Recent Labs  Lab 05/20/19 0408 05/21/19 0427 05/23/19 0850 05/25/19 0440 05/26/19 0530  NA 135 137 137 139 138  K 4.2 3.8 4.2 3.9 4.0  CL 107 108 109 110 107  CO2 19* 18* 20* 21* 21*  GLUCOSE 107* 89 111* 102* 91  BUN 32* 30* 28* 29* 28*  CREATININE 1.95* 2.01* 1.94* 1.77* 1.66*  CALCIUM 8.2* 8.1* 8.5* 8.6* 8.4*   Liver Function Tests: Recent Labs  Lab 05/20/19 0408  AST 22  ALT 18  ALKPHOS 67  BILITOT 1.0  PROT 5.9*  ALBUMIN 2.6*   CBC: Recent Labs  Lab 05/20/19 0408 05/21/19 0425  WBC 11.2* 10.2  HGB 12.2* 12.0*  HCT 35.8* 36.4*  MCV 88.2 91.5  PLT 282 278    CBG: Recent Labs  Lab 05/19/19 1443  GLUCAP 100*    Recent Results (from the past 240 hour(s))  SARS Coronavirus 2 by RT PCR (hospital order, performed in [facility] hospital lab) Nasopharyngeal Nasopharyngeal Swab     Status: None  Collection Time: 05/19/19 12:40 PM   Specimen: Nasopharyngeal Swab  Result Value Ref Range Status   SARS Coronavirus 2 NEGATIVE NEGATIVE Final    Comment: (NOTE) SARS-CoV-2 target nucleic acids are NOT DETECTED. The SARS-CoV-2 RNA is generally detectable in upper and lower respiratory specimens during the acute phase of infection. The lowest concentration of SARS-CoV-2 viral copies this assay can detect is 250 copies / mL. A negative result does not preclude SARS-CoV-2 infection and should not be used as the sole basis for treatment or other patient management decisions.  A negative result may occur with improper specimen collection / handling, submission of specimen  other than nasopharyngeal swab, presence of viral mutation(s) within the areas targeted by this assay, and inadequate number of viral copies (<250 copies / mL). A negative result must be combined with clinical observations, patient history, and epidemiological information. Fact Sheet for Patients:   StrictlyIdeas.no Fact Sheet for Healthcare Providers: BankingDealers.co.za This test is not yet approved or cleared  by the [place] FDA and has been authorized for detection and/or diagnosis of SARS-CoV-2 by FDA under an Emergency Use Authorization (EUA).  This EUA will remain in effect (meaning this test can be used) for the duration of the COVID-19 declaration under Section 564(b)(1) of the Act, 21 U.S.C. section 360bbb-3(b)(1), unless the authorization is terminated or revoked sooner. Performed at [facility], [address] [place], [place] [address]   MRSA PCR Screening     Status: None   Collection Time: 05/19/19  3:57 PM   Specimen: Nasal Mucosa; Nasopharyngeal  Result Value Ref Range Status   MRSA by PCR NEGATIVE NEGATIVE Final    Comment:        The GeneXpert MRSA Assay (FDA approved for NASAL specimens only), is one component of a comprehensive MRSA colonization surveillance program. It is not intended to diagnose MRSA infection nor to guide or monitor treatment for MRSA infections. Performed at [facility], [address] [place], [place] [address]       Scheduled Meds: . apixaban  5 mg Oral BID  . atorvastatin  40 mg Oral q1800  . carvedilol  3.125 mg Oral BID WC  . Chlorhexidine Gluconate Cloth  6 each Topical Daily  . clopidogrel  75 mg Oral Daily  . feeding supplement (ENSURE ENLIVE)  237 mL Oral BID BM  . hydrALAZINE  25 mg Oral TID  . isosorbide mononitrate  60 mg Oral Daily  . sodium chloride flush  3 mL Intravenous Q12H   Continuous Infusions: . sodium chloride       Assessment/Plan:  1. 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  Repeat CT scan did not show any hemorrhage.  Patient having difficulty with processing words and saying what he wants to say.  Patient also has incoordination of the right arm.  I am still concerned about the patient living home alone.  Patient started on Eliquis 05/25/2019.  And is also on Plavix.  Transition care team looking into rehab. 2. STEMI.  Medical management with Plavix, atorvastatin and Coreg. 3. Hyperlipidemia.  LDL 75.  Goal less than 70.  Atorvastatin prescribed 4. Chronic kidney disease stage IIIb.  Creatinine actually improving down to 1.66 today. 5. Paroxysmal atrial fibrillation.  Started on Eliquis 05/25/2019. 6. Cardiomyopathy with chronic systolic congestive heart failure.  No ACE or ARB with kidney injury.  No spironolactone with kidney injury.  On beta-blocker only.  Code Status:  Code Status Orders  (From admission, onward)         Start     Ordered   05/19/19 1459  Full code  Continuous     05/19/19 1459        Code Status History    This patient has a current code status but no historical code status.   Advance Care Planning Activity     Family Communication: Spoke with sister on the phone today Disposition Plan: Concerned about patient living home alone.  Transition care team looking into rehab.  Consultants:  Cardiology  Neurology  Time spent: 27 minutes.  [place]  [facility] [employer]

## 2019-05-26 NOTE — Progress Notes (Signed)
Progress Note  Patient Name: Gordon [name] Date of Encounter: 05/26/2019  Primary Cardiologist: Kathlyn Sacramento, MD  Subjective   He denies chest pain or shortness of breath.  He went into A. fib with RVR today.  He denies palpitations.  Inpatient Medications    Scheduled Meds: . apixaban  5 mg Oral BID  . atorvastatin  40 mg Oral q1800  . Chlorhexidine Gluconate Cloth  6 each Topical Daily  . clopidogrel  75 mg Oral Daily  . feeding supplement (ENSURE ENLIVE)  237 mL Oral BID BM  . hydrALAZINE  25 mg Oral TID  . isosorbide mononitrate  60 mg Oral Daily  . metoprolol tartrate  25 mg Oral Q6H  . sodium chloride flush  3 mL Intravenous Q12H   Continuous Infusions: . sodium chloride     PRN Meds: sodium chloride, acetaminophen, metoprolol tartrate, ondansetron (ZOFRAN) IV, sodium chloride flush   Vital Signs    Vitals:   05/26/19 0547 05/26/19 0838 05/26/19 1351 05/26/19 1508  BP: 128/75 (!) 155/90 135/84 (!) 125/91  Pulse: 78 78 (!) 117 (!) 122  Resp:  18  18  Temp: 99 F (37.2 C) 98.7 F (37.1 C)  98.3 F (36.8 C)  TempSrc: Oral Oral  Oral  SpO2: 94% 98%  98%  Weight: 73.5 kg     Height:        Intake/Output Summary (Last 24 hours) at 05/26/2019 1531 Last data filed at 05/26/2019 1349 Gross per 24 hour  Intake 603 ml  Output 900 ml  Net -297 ml   Filed Weights   05/24/19 0533 05/25/19 0402 05/26/19 0547  Weight: 74.4 kg 73.1 kg 73.5 kg    Physical Exam   Constitutional:  Pt in NAD Neck: JVP is normal  Cardiovascular: Regular rate and rhythm, no murmurs appreciated Pulmonary/Chest: Clear to auscultation bilaterally, no wheezes or rales   Abdominal: Soft.  no distension.  no tenderness.  Musculoskeletal: Normal range of motion Neurological:  Deferred Skin: Skin warm and dry    Labs    Chemistry Recent Labs  Lab 05/20/19 0408 05/23/19 0850 05/25/19 0440 05/26/19 0530  NA 135 137 139 138  K 4.2 4.2 3.9 4.0  CL 107 109 110 107  CO2 19*  20* 21* 21*  GLUCOSE 107* 111* 102* 91  BUN 32* 28* 29* 28*  CREATININE 1.95* 1.94* 1.77* 1.66*  CALCIUM 8.2* 8.5* 8.6* 8.4*  PROT 5.9*  --   --   --   ALBUMIN 2.6*  --   --   --   AST 22  --   --   --   ALT 18  --   --   --   ALKPHOS 67  --   --   --   BILITOT 1.0  --   --   --   GFRNONAA 35* 36* 40* 43*  GFRAA 41* 41* 46* 50*  ANIONGAP 9 8 8 10      Hematology Recent Labs  Lab 05/20/19 0408 05/21/19 0425  WBC 11.2* 10.2  RBC 4.06* 3.98*  HGB 12.2* 12.0*  HCT 35.8* 36.4*  MCV 88.2 91.5  MCH 30.0 30.2  MCHC 34.1 33.0  RDW 13.8 14.1  PLT 282 278    Cardiac Enzymes  Recent Labs  Lab 05/19/19 1026 05/19/19 1446 05/19/19 1720  TROPONINIHS 3,929* [id number]* 3,729*       Radiology    No results found.  Telemetry    SB/SR  - Personally Reviewed  Cardiac Studies   Cardiac Catheterization and Percutaneous Coronary Intervention 12.7.2020  Left Anterior Descending  Ost LAD to Prox LAD lesion 30% stenosed  Ost LAD to Prox LAD lesion is 30% stenosed.  Prox LAD to Mid LAD lesion 30% stenosed  Prox LAD to Mid LAD lesion is 30% stenosed.  Mid LAD to Dist LAD lesion 60% stenosed  Mid LAD to Dist LAD lesion is 60% stenosed.  Left Circumflex  There is mild diffuse disease throughout the vessel.  Left Posterior Atrioventricular Artery  The vessel exhibits minimal luminal irregularities.  Right Coronary Artery  Prox RCA to Mid RCA lesion 100% stenosed  Prox RCA to Mid RCA lesion is 100% stenosed. Vessel is the culprit lesion. The lesion is type C and thrombotic with left-to-right collateral flow. The lesion is severely calcified.  Right Posterior Descending Artery  Collaterals  RPDA filled by collaterals from Dist LAD.    RPDA lesion 90% stenosed  RPDA lesion is 90% stenosed.  EF of 30 to 35% with akinesis of the inferior wall.   **Unsuccessful balloon angioplasty of the right coronary artery given diffuse disease, calcifications and thrombus. This was in spite of  multiple balloon inflations throughout the whole vessel.  _____________   2D Echocardiogram    1. Left ventricular ejection fraction, by visual estimation, is 30 to 35%. The left ventricle has moderate to severely decreased function. There is no left ventricular hypertrophy.  2. Left ventricular diastolic parameters are consistent with Grade II diastolic dysfunction (pseudonormalization).  3. There is severe hypokinesis of the inferior and inferoseptal walls.  4. There is septal "bounce" suggestive of increased intrapericardial pressure or constriction.  5. Global right ventricle has moderately reduced systolic function.The right ventricular size is mildly enlarged. No increase in right ventricular wall thickness.  6. Left atrial size was normal.  7. Right atrial size was mildly dilated.  8. Small pericardial effusion.  9. Mild to moderate mitral annular calcification. 10. The mitral valve is degenerative. Mild mitral valve regurgitation. No evidence of mitral stenosis. 11. The tricuspid valve is not well visualized. Tricuspid valve regurgitation mild-moderate. 12. The aortic valve is tricuspid. Aortic valve regurgitation is not visualized. Mild to moderate aortic valve sclerosis/calcification without any evidence of aortic stenosis. 13. The pulmonic valve was not well visualized. Pulmonic valve regurgitation is not visualized. 14. Mildly dilated pulmonary artery. 15. Mildly elevated pulmonary artery systolic pressure. 16. The inferior vena cava is dilated in size with <50% respiratory variability, suggesting right atrial pressure of 15 mmHg. _____________   Patient Profile     63 y.o. male with a history of hypertension, tobacco use, and admitted with Afib RVR, late presenting inferior ST elevation MI s/p unsuccessful PCI to RCA, ICM, AKI (? Chronicity of renal dzs), and L MCA territory stroke.  Assessment & Plan    1.  Acute inferior STEMI/CAD: The patient was found to have occluded  proximal right coronary artery which was diffusely diseased and heavily calcified with left-to-right collaterals.  Balloon angioplasty was performed on multiple locations but was not successful due to diffuse disease, calcifications and thrombus.  This was likely an acute on chronic occlusion. EF 30-35% w/ inferior HK.  Peak HsTrop 3729.   --Continue Plavix, statin, beta-blocker, hydralazine /NTG   --cardiac rehab.  No ASA as starting Eliquis  2.  Acute L MCA territory stroke:   Followed by neurology, seems to be improving clinically.  3 -Paroxysmal atrial fibrillation  : The patient went back into atrial fibrillation  today with tachycardia.  I switched carvedilol to metoprolol for better rate control.  Continue anticoagulation with Eliquis.  4.  ICM/HFrEF:   EF 30-35% w/ inf wma's.    No ACEI/ARB/ARNI/MRA 2/2 renal failure.   Volume status is good    -Currently on metoprolol, isosorbide, hydralazine .  5.  Acute on chronic renal disease Stable creatinine at 1.66.  6.  Tob abuse:  Counselled on quitting    7  HL  Keep on lipitor      Signed, Kathlyn Sacramento, MD  05/26/2019, 3:31 PM    For questions or updates, please contact   Please consult [email or web address] for contact info under Cardiology/STEMI.

## 2019-05-27 ENCOUNTER — Encounter: Payer: Self-pay | Admitting: Cardiovascular Disease

## 2019-05-27 LAB — CBC
HCT: 35.5 % — ABNORMAL LOW (ref 39.0–52.0)
Hemoglobin: 12 g/dL — ABNORMAL LOW (ref 13.0–17.0)
MCH: 29.8 pg (ref 26.0–34.0)
MCHC: 33.8 g/dL (ref 30.0–36.0)
MCV: 88.1 fL (ref 80.0–100.0)
Platelets: 258 10*3/uL (ref 150–400)
RBC: 4.03 MIL/uL — ABNORMAL LOW (ref 4.22–5.81)
RDW: 14.6 % (ref 11.5–15.5)
WBC: 9.8 10*3/uL (ref 4.0–10.5)
nRBC: 0 % (ref 0.0–0.2)

## 2019-05-27 LAB — BASIC METABOLIC PANEL
Anion gap: 11 (ref 5–15)
BUN: 33 mg/dL — ABNORMAL HIGH (ref 8–23)
CO2: 20 mmol/L — ABNORMAL LOW (ref 22–32)
Calcium: 8.5 mg/dL — ABNORMAL LOW (ref 8.9–10.3)
Chloride: 106 mmol/L (ref 98–111)
Creatinine, Ser: 1.76 mg/dL — ABNORMAL HIGH (ref 0.61–1.24)
GFR calc Af Amer: 46 mL/min — ABNORMAL LOW (ref >60)
GFR calc non Af Amer: 40 mL/min — ABNORMAL LOW (ref >60)
Glucose, Bld: 100 mg/dL — ABNORMAL HIGH (ref 70–99)
Potassium: 4.1 mmol/L (ref 3.5–5.1)
Sodium: 137 mmol/L (ref 135–145)

## 2019-05-27 NOTE — NC FL2 (Signed)
[place] LEVEL OF CARE SCREENING TOOL     IDENTIFICATION  Patient Name: Gordon [name] Birthdate: 1955/11/13 Sex: male Admission Date (Current Location): 05/19/2019  [place] and [place] Number:  Network engineer and Address:  [facility], [address] [place], [place] [address]      Provider Number: [id number]  Attending Physician Name and Address:  Loletha Grayer, MD  Relative Name and Phone Number:  Ancil Linsey Sister [phone]  [phone] or Tonna Boehringer Niece   [phone]    Current Level of Care: Hospital Recommended Level of Care: [place] Prior Approval Number:    Date Approved/Denied:   PASRR Number: [id number]  Discharge Plan: SNF    Current Diagnoses: Patient Active Problem List   Diagnosis Date Noted  . Stage 3b chronic kidney disease   . Acute CVA (cerebrovascular accident) ([place])   . Aphasia   . AF (paroxysmal atrial fibrillation) ([place])   . Acute renal failure superimposed on stage 3a chronic kidney disease ([place])   . Atrial fibrillation with rapid ventricular response ([place])   . HFrEF (heart failure with reduced ejection fraction) ([place])   . ST elevation myocardial infarction (STEMI) ([place]) 05/19/2019    Orientation RESPIRATION BLADDER Height & Weight     Self, Time, Situation, Place  Normal Incontinent Weight: 167 lb (75.8 kg) Height:  5\' 8"  (172.7 cm)  BEHAVIORAL SYMPTOMS/MOOD NEUROLOGICAL BOWEL NUTRITION STATUS      Continent Diet(Cardiac)  AMBULATORY STATUS COMMUNICATION OF NEEDS Skin   Limited Assist Verbally Normal                       Personal Care Assistance Level of Assistance  Bathing, Feeding, Dressing Bathing Assistance: Limited assistance Feeding assistance: Independent Dressing Assistance: Limited assistance     Functional Limitations Info  Sight, Hearing, Speech Sight Info: Adequate Hearing Info: Adequate Speech Info: Adequate    SPECIAL  CARE FACTORS FREQUENCY  PT (By licensed PT), OT (By licensed OT), Speech therapy     PT Frequency: Minimum 5x a week OT Frequency: Minimum 5x a week     Speech Therapy Frequency: Minimum 5x a week      Contractures Contractures Info: Not present    Additional Factors Info  Code Status, Allergies Code Status Info: Full Code Allergies Info: NKA           Current Medications (05/27/2019):  This is the current hospital active medication list Current Facility-Administered Medications  Medication Dose Route Frequency Provider Last Rate Last Admin  . 0.9 %  sodium chloride infusion  250 mL Intravenous PRN Kathlyn Sacramento A, MD      . acetaminophen (TYLENOL) tablet 650 mg  650 mg Oral Q4H PRN Wellington Hampshire, MD      . apixaban (ELIQUIS) tablet 5 mg  5 mg Oral BID Leotis Pain, MD   5 mg at 05/27/19 0955  . atorvastatin (LIPITOR) tablet 40 mg  40 mg Oral q1800 End, Christopher, MD   40 mg at 05/26/19 1632  . Chlorhexidine Gluconate Cloth 2 % PADS 6 each  6 each Topical Daily Wellington Hampshire, MD   6 each at 05/27/19 [phone]  . clopidogrel (PLAVIX) tablet 75 mg  75 mg Oral Daily Kathlyn Sacramento A, MD   75 mg at 05/27/19 0956  . feeding supplement (ENSURE ENLIVE) (ENSURE ENLIVE) liquid 237 mL  237 mL Oral BID BM Kathlyn Sacramento A, MD   237 mL at 05/27/19 0959  .  hydrALAZINE (APRESOLINE) tablet 25 mg  25 mg Oral TID Minna Merritts, MD   25 mg at 05/27/19 0956  . isosorbide mononitrate (IMDUR) 24 hr tablet 60 mg  60 mg Oral Daily Minna Merritts, MD   60 mg at 05/27/19 0956  . metoprolol tartrate (LOPRESSOR) injection 5 mg  5 mg Intravenous Q1H PRN Loletha Grayer, MD   5 mg at 05/26/19 1852  . metoprolol tartrate (LOPRESSOR) tablet 25 mg  25 mg Oral Q6H Wellington Hampshire, MD   25 mg at 05/27/19 0956  . ondansetron (ZOFRAN) injection 4 mg  4 mg Intravenous Q6H PRN Kathlyn Sacramento A, MD      . sodium chloride flush (NS) 0.9 % injection 3 mL  3 mL Intravenous Q12H Wellington Hampshire,  MD   3 mL at 05/27/19 0957  . sodium chloride flush (NS) 0.9 % injection 3 mL  3 mL Intravenous PRN Wellington Hampshire, MD         Discharge Medications: Please see discharge summary for a list of discharge medications.  Relevant Imaging Results:  Relevant Lab Results:   Additional Information SSN [id number]  Ross Ludwig, LCSW

## 2019-05-27 NOTE — Progress Notes (Signed)
Progress Note  Patient Name: Gordon [name] Date of Encounter: 05/27/2019  Primary Cardiologist: Kathlyn Sacramento, MD  Subjective   He denies chest pain, palpitations, or racing heart rate.  He reports that he does not feel short of breath. He reportedly ambulated with PT x3 around the entire telemetry loop without difficulty / SOB / elevated rates. He does note residual R sided weakness with subsequent physical exam findings as listed below. He states he wants to quit smoking.   Inpatient Medications    Scheduled Meds: . apixaban  5 mg Oral BID  . atorvastatin  40 mg Oral q1800  . Chlorhexidine Gluconate Cloth  6 each Topical Daily  . clopidogrel  75 mg Oral Daily  . feeding supplement (ENSURE ENLIVE)  237 mL Oral BID BM  . hydrALAZINE  25 mg Oral TID  . isosorbide mononitrate  60 mg Oral Daily  . metoprolol tartrate  25 mg Oral Q6H  . sodium chloride flush  3 mL Intravenous Q12H   Continuous Infusions: . sodium chloride     PRN Meds: sodium chloride, acetaminophen, metoprolol tartrate, ondansetron (ZOFRAN) IV, sodium chloride flush   Vital Signs    Vitals:   05/26/19 1631 05/26/19 2030 05/27/19 0405 05/27/19 0758  BP: (!) 142/96 (!) 139/96 127/86 134/81  Pulse: (!) 132 (!) 114 72 72  Resp:  20 (!) 24 17  Temp:  98.1 F (36.7 C) 98.7 F (37.1 C) 98.6 F (37 C)  TempSrc:  Oral Oral Oral  SpO2:  97% 97% 97%  Weight:   75.8 kg   Height:        Intake/Output Summary (Last 24 hours) at 05/27/2019 1140 Last data filed at 05/27/2019 0405 Gross per 24 hour  Intake 480 ml  Output 900 ml  Net -420 ml   Last 3 Weights 05/27/2019 05/26/2019 05/25/2019  Weight (lbs) 167 lb 162 lb 1.6 oz 161 lb 3.2 oz  Weight (kg) 75.751 kg 73.528 kg 73.12 kg      Telemetry    Earlier Afib/SB, currently SR- Personally Reviewed  ECG    No new tracings - Personally Reviewed  Physical Exam   GEN: No acute distress.  Joined by his sister, Angie Neck: No JVD Cardiac: RRR, no  murmurs, rubs, or gallops.  Respiratory: Expiratory wheeze otherwise CTAB GI: Soft, nontender, non-distended  MS: Trace lower extremity edema; No deformity. Neuro: Occasionally struggles to find words, Bilateral strength assessed and equal on brief neuro exam Psych: Normal affect   Labs    High Sensitivity Troponin:   Recent Labs  Lab 05/19/19 1026 05/19/19 1446 05/19/19 1720  TROPONINIHS 3,929* 3,682* 3,729*      Cardiac EnzymesNo results for input(s): TROPONINI in the last 168 hours. No results for input(s): TROPIPOC in the last 168 hours.   Chemistry Recent Labs  Lab 05/25/19 0440 05/26/19 0530 05/27/19 0618  NA 139 138 137  K 3.9 4.0 4.1  CL 110 107 106  CO2 21* 21* 20*  GLUCOSE 102* 91 100*  BUN 29* 28* 33*  CREATININE 1.77* 1.66* 1.76*  CALCIUM 8.6* 8.4* 8.5*  GFRNONAA 40* 43* 40*  GFRAA 46* 50* 46*  ANIONGAP 8 10 11      Hematology Recent Labs  Lab 05/21/19 0425  WBC 10.2  RBC 3.98*  HGB 12.0*  HCT 36.4*  MCV 91.5  MCH 30.2  MCHC 33.0  RDW 14.1  PLT 278    BNPNo results for input(s): BNP, PROBNP in the last 168 hours.  DDimer No results for input(s): DDIMER in the last 168 hours.   Radiology    No results found.  Cardiac Studies   Cardiac Catheterization and Percutaneous Coronary Intervention 12.7.2020  Left Anterior Descending  Ost LAD to Prox LAD lesion 30% stenosed  Ost LAD to Prox LAD lesion is 30% stenosed.  Prox LAD to Mid LAD lesion 30% stenosed  Prox LAD to Mid LAD lesion is 30% stenosed.  Mid LAD to Dist LAD lesion 60% stenosed  Mid LAD to Dist LAD lesion is 60% stenosed.  Left Circumflex  There is mild diffuse disease throughout the vessel.  Left Posterior Atrioventricular Artery  The vessel exhibits minimal luminal irregularities.  Right Coronary Artery  Prox RCA to Mid RCA lesion 100% stenosed  Prox RCA to Mid RCA lesion is 100% stenosed. Vessel is the culprit lesion. The lesion is type C and thrombotic with  left-to-right collateral flow. The lesion is severely calcified.  Right Posterior Descending Artery  Collaterals  RPDA filled by collaterals from Dist LAD.    RPDA lesion 90% stenosed  RPDA lesion is 90% stenosed.  EF of 30 to 35% with akinesis of the inferior wall.   **Unsuccessful balloon angioplasty of the right coronary artery given diffuse disease, calcifications and thrombus. This was in spite of multiple balloon inflations throughout the whole vessel.  _____________   2D Echocardiogram   1. Left ventricular ejection fraction, by visual estimation, is 30 to 35%. The left ventricle has moderate to severely decreased function. There is no left ventricular hypertrophy. 2. Left ventricular diastolic parameters are consistent with Grade II diastolic dysfunction (pseudonormalization). 3. There is severe hypokinesis of the inferior and inferoseptal walls. 4. There is septal "bounce" suggestive of increased intrapericardial pressure or constriction. 5. Global right ventricle has moderately reduced systolic function.The right ventricular size is mildly enlarged. No increase in right ventricular wall thickness. 6. Left atrial size was normal. 7. Right atrial size was mildly dilated. 8. Small pericardial effusion. 9. Mild to moderate mitral annular calcification. 10. The mitral valve is degenerative. Mild mitral valve regurgitation. No evidence of mitral stenosis. 11. The tricuspid valve is not well visualized. Tricuspid valve regurgitation mild-moderate. 12. The aortic valve is tricuspid. Aortic valve regurgitation is not visualized. Mild to moderate aortic valve sclerosis/calcification without any evidence of aortic stenosis. 13. The pulmonic valve was not well visualized. Pulmonic valve regurgitation is not visualized. 14. Mildly dilated pulmonary artery. 15. Mildly elevated pulmonary artery systolic pressure. 16. The inferior vena cava is dilated in size with <50% respiratory  variability, suggesting right atrial pressure of 15 mmHg. _____________   Patient Profile     63 y.o. male with history of hypertension, DM2, tobacco use, and admitted with Afib RVR and late presenting inferior ST elevation MI s/p unsuccessful PCI to RCA, ICM, AKI, and L MCA territory stroke.  Assessment & Plan    Acute inferior STEMI/CAD --Occluded proximal right coronary artery was diffusely diseased and heavily calcified with left to right collaterals.  Balloon angioplasty performed on multiple locations and not successful due to diffuse disease, calcifications, and thrombus.  This is suspected to be likely an acute on chronic occlusion.  EF 30 to 35% with inferior hypokinesis.  Peak high-sensitivity Tn 3729. --Continue Plavix without ASA as starting Eliquis for Afib (outlined below), BB, statin, hydralazine. Will add PRN SL nitro to continue at discharge as needed for CP.   Acute L MCA territory stroke --Followed by neurology. Some word finding difficulty today. PT report  was that patient ambulated well and without difficulty or signs of residual weakness. On exam, upper and lower bilateral extremities equal in strength on brief neuro exam, performed to assess for risk of fall on Plavix and Eliquis. Further recommendations per neuro.  New onset paroxysmal atrial fibrillation with RVR --Currently NSR but was back into Afib on 12/14 with Coreg changed to metoprolol with subsequent better rate control. CHA2DS2VASc score of at least 5  (CHF, HTN, DM2 (Hgb 6.1), strokex2) soon to be 23 (age), with long term [place] indicated at discharge. Continue Eliquis 5mg  BID with CBC ordered this AM to reassess Hgb -previous Hgb 12.0 and RBC 3.98.   ICM/HFrEF --No SOB on exam. States he gets occasional pedal and LEE at home. EF 30-35% with inferior WMA. Euvolemic on exam today. Continue BB, isosorbide, hydralazine. Recommend follow-up echo in 3-6 months to reassess EF as an outpatient. Recommend office follow-up  in 1-2 weeks.   AOCKD --Daily BMET with current Cr stable at 1.66.  Due to lack of insurance, recommend nephrology follow-up as financial constraints allow.  Tobacco abuse --Patient agreeable to continuing to not smoke at discharge. Reviewed the risks associated with continued smoking and benefits of cessation.  HLD --Continue Lipitor. Will need f/u labs for LFTs and lipids in 6-8 weeks after start of statin. Previous LFTs stable and 12/8 LDL 75 with goal LDL <70.  DM2 --Hgb 6.1. Recommend follow-up with PCP for glycemic control with consideration of financial constraints due to noted lack of insurance. Consider nutrition consult for dietary control if PCP follow-up not possible at this time.   For questions or updates, please contact [place] Please consult [email or web address] for contact info under        Signed, Arvil Chaco, PA-C  05/27/2019, 11:40 AM

## 2019-05-27 NOTE — Progress Notes (Signed)
Occupational Therapy Treatment Patient Details Name: Gordon [name] MRN: [id number] DOB: 1955-09-05 Today's Date: 05/27/2019    History of present illness Gordon [name] is a 25yoM who comes [facility] c CP and gen weakness, notable HTN and AFcRVR, pt admitted c STEMI, HeadCT negaitve. Pt underwent a cardiac catheterization on 12/7 status post unsuccessful balloon angioplasty of the RCA due to diffuse disease, calcification and thrombus. Pt having expressive difficulty and motor control impairment of the right hemibody. Head CT/MRI showing acute CVA. PTA pt was fully independent, retired but looking ino to working part time in the future.   OT comments  Pt seen for grooming skills standing at sink with both tactile and verbal cues for balance and problem solving use of R hand due to apraxia and mild R neglect.  Decreased balance this session but appears more aware of how to compensate unless he gets frustrated and needed therapist to hold his hand away from head and not hit it when getting frustrated with verbal skills due to aphasia.   Pt requires min cueing to brush his teeth and HOH to initiate proper movement pattern of R hand for brushing teeth and combing hair with R hand. 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 RUE coordination improves with assist to place both toothbrush and comb with proper angle before using. He has significant difficulty with precise movements.  Built-up handle applied to toothbrush did help to support functional grip.  Worked on breathing exercises to help him calm down from getting frustrated with impaired expressive language with good follow through.   Follow Up Recommendations  CIR    Equipment Recommendations  3 in 1 bedside commode    Recommendations for Other Services      Precautions / Restrictions Precautions Precautions: Fall Restrictions Weight Bearing Restrictions: No       Mobility Bed Mobility                   Transfers                      Balance                                           ADL either performed or assessed with clinical judgement   ADL Overall ADL's : Needs assistance/impaired       Grooming Details (indicate cue type and reason): Pt seen for grooming skills standing at sink with both tactile and verbal cues for balance and problem solving use of R hand due to apraxia and mild R neglect.  Decreased balance this session but appears more aware of how to compensate unless he gets frustrated and needed therapist to hold his hand away from head and not hit it when getting frustrated with verbal skills due to aphasia.   Pt requires min cueing to brush his teeth and HOH to initiate proper movement pattern of R hand for brushing teeth and combing hair with R hand. 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 RUE coordination improves with assist to place both toothbrush and comb with proper angle before using. He has significant difficulty with precise movements.  Built-up handle applied to toothbrush did help to support functional grip.  General ADL Comments: Pt very focused on trying to say names of items used for self care skills and needed cues to calm down when getting angry with name identification.  Pt does well with posiitve affirmation and encouragement and given handout to practice naming different objects in printed picture using familar words and scanning R and L.     Vision       Perception     Praxis      Cognition Arousal/Alertness: Awake/alert Behavior During Therapy: WFL for tasks assessed/performed Overall Cognitive Status: Impaired/Different from baseline Area of Impairment: Safety/judgement;Following commands;Problem solving;Awareness                       Following Commands: Follows one step commands with increased  time;Follows multi-step commands with increased time Safety/Judgement: Decreased awareness of deficits;Decreased awareness of safety Awareness: Emergent Problem Solving: Difficulty sequencing;Requires tactile cues;Slow processing;Decreased initiation;Requires verbal cues General Comments: continues to have limited awareness of Rt limbs, limited sensation/proprioception.        Exercises     Shoulder Instructions       General Comments      Pertinent Vitals/ Pain       Pain Assessment: No/denies pain  Home Living                                          Prior Functioning/Environment              Frequency  Min 3X/week        Progress Toward Goals  OT Goals(current goals can now be found in the care plan section)  Progress towards OT goals: Progressing toward goals  Acute Rehab OT Goals Patient Stated Goal: Want to get better and want to go to rehab for a lot of therapy OT Goal Formulation: With patient Time For Goal Achievement: 06/05/19 Potential to Achieve Goals: Good  Plan Discharge plan remains appropriate;Frequency remains appropriate    Co-evaluation                 AM-PAC OT "6 Clicks" Daily Activity     Outcome Measure   Help from another person eating meals?: A Little Help from another person taking care of personal grooming?: A Little Help from another person toileting, which includes using toliet, bedpan, or urinal?: A Lot Help from another person bathing (including washing, rinsing, drying)?: A Lot Help from another person to put on and taking off regular upper body clothing?: A Little Help from another person to put on and taking off regular lower body clothing?: A Lot 6 Click Score: 15    End of Session Equipment Utilized During Treatment: Gait belt  OT Visit Diagnosis: Other abnormalities of gait and mobility (R26.89);Hemiplegia and hemiparesis;Apraxia (R48.2);Feeding difficulties (R63.3) Hemiplegia - Right/Left:  Right Hemiplegia - dominant/non-dominant: Dominant Hemiplegia - caused by: Cerebral infarction   Activity Tolerance Patient tolerated treatment well   Patient Left in bed;with bed alarm set;with call bell/phone within reach   Nurse Communication          Time: [id number] OT Time Calculation (min): 42 min  Charges: OT General Charges $OT Visit: 1 Visit OT Treatments $Self Care/Home Management : 38-52 mins  Chrys Racer, OTR/L, [place] ascom [phone] 05/27/19, 4:16 PM

## 2019-05-27 NOTE — Progress Notes (Signed)
Physical Therapy Treatment Patient Details Name: Gordon [name] MRN: [id number] DOB: 1956-03-02 Today's Date: 05/27/2019    History of Present Illness Gordon [name] is a 42yoM who comes [facility] 12/7 c CP and gen weakness, notable HTN and AFcRVR, pt admitted c STEMI, HeadCT negaitve. Pt underwent a cardiac catheterization on 12/7 status post unsuccessful balloon angioplasty of the RCA due to diffuse disease, calcification and thrombus. Pt having expressive difficulty and motor control impairment of the right hemibody. Head CT/MRI showing acute CVA. PTA pt was fully independent, retired but looking ino to working part time in the future.    PT Comments    Pt in bed upon arrival, enthusiastic as typical, pt often highly motivated to progress his independence. Pt was not seen by PT yesterday secondary to tachycardia issues. Today HR remains stable in 80s t/o session, including 500+ft AMB. Continued to focus on multiplanar gait, unsupported with good success. Pt struggles with side stepping to right. Pt also continues to have poor awareness of RLE in walking, when kicking the walker he reports only somewhat aware. Pt is given some standing balance activity with cognitive challenge this date, pt doing fairly well with LUE, but struggling with RUE showing significant cognitive difficulty with dual tasking. Pt has drastic loss of ability to pull out individual letters from the communication board when using the RUE,unclear if related to Rt sided neglect v alexia issue that I am unaware of but never the less these issues are less noted when pt is using LUE. Author returned to room 1 hour later as promised to help pt back to bed as he remains anxious and fearful about being forgotten up in chair.     Follow Up Recommendations  SNF;Supervision for mobility/OOB;Supervision/Assistance - 24 hour     Equipment Recommendations  Rolling walker with 5" wheels    Recommendations for Other Services        Precautions / Restrictions Precautions Precautions: Fall Restrictions Weight Bearing Restrictions: No    Mobility  Bed Mobility Overal bed mobility: Needs Assistance Bed Mobility: Supine to Sit;Sit to Supine     Supine to sit: Supervision Sit to supine: Supervision   General bed mobility comments: difficulty with coordinating Right LE in/out of bed, but is able to problem solve better now with additional time/effort  Transfers Overall transfer level: Needs assistance Equipment used: Rolling walker (2 wheeled);None Transfers: Sit to/from Stand Sit to Stand: Min guard         General transfer comment: cues for Rt foot awareness; pt slwo and cautious, no LOB noted.  Ambulation/Gait Ambulation/Gait assistance: Min guard Gait Distance (Feet): 540 Feet Assistive device: Rolling walker (2 wheeled) Gait Pattern/deviations: Step-through pattern Gait velocity: 0.71m/s this date; 0.52m/s on 12/13   General Gait Details: cued at beginning to maintain RW central to body to avoid kicking RW with RLE as 2DA; Pt moves slow to focus on control, no kicking intitally but 4-5 kicks over the last 267ft. Author provided no cues for correction this date, but when asked, pt reports limited awareness of kicking.   Stairs             Wheelchair Mobility    Modified Rankin (Stroke Patients Only)       Balance  Cognition Arousal/Alertness: Awake/alert Behavior During Therapy: WFL for tasks assessed/performed(still has some lability, ease in frustration espeically with difficult cognitive tasks) Overall Cognitive Status: Impaired/Different from baseline                               Problem Solving: Difficulty sequencing;Requires tactile cues;Slow processing;Decreased initiation;Requires verbal cues General Comments: continues to have limited awareness of Rt limbs, limited sensation/proprioception.       Exercises Other Exercises Other Exercises: lateral sidestepping arms unsupported 2x74ft bilat minGuard assist Other Exercises: -Standing unsupported at communication board: 1x10 reach and touch letter search, given cues for individual letters. With LUE pt is able to find letters 70% of time with mod increased time. With RUE pt is successful 20% of time with lots of error and frustration. Pt does inititally limit self to Left sid eof board. When frustrated with RUE, he reverts to using LUE cues to return to task. Pt also difficulty with remembering which letter he's searching for on RUE.    General Comments        Pertinent Vitals/Pain Pain Assessment: No/denies pain    Home Living                      Prior Function            PT Goals (current goals can now be found in the care plan section) Acute Rehab PT Goals Patient Stated Goal: Want to get better and want to go to rehab for a lot of therapy PT Goal Formulation: With patient Time For Goal Achievement: 06/04/19 Potential to Achieve Goals: Fair Progress towards PT goals: Progressing toward goals    Frequency    7X/week      PT Plan Current plan remains appropriate    Co-evaluation              AM-PAC PT "6 Clicks" Mobility   Outcome Measure  Help needed turning from your back to your side while in a flat bed without using bedrails?: A Little Help needed moving from lying on your back to sitting on the side of a flat bed without using bedrails?: A Little Help needed moving to and from a bed to a chair (including a wheelchair)?: A Little Help needed standing up from a chair using your arms (e.g., wheelchair or bedside chair)?: A Little Help needed to walk in hospital room?: A Little Help needed climbing 3-5 steps with a railing? : A Little 6 Click Score: 18    End of Session Equipment Utilized During Treatment: Gait belt Activity Tolerance: Patient tolerated treatment well;No increased pain Patient  left: with call bell/phone within reach;in chair;with chair alarm set(pt frustrated, agreeable only with author promising to return in 1 hour.) Nurse Communication: Mobility status;Other (comment) PT Visit Diagnosis: Unsteadiness on feet (R26.81);Difficulty in walking, not elsewhere classified (R26.2);Apraxia (R48.2);Ataxic gait (R26.0);Other abnormalities of gait and mobility (R26.89)     Time: [id number] PT Time Calculation (min) (ACUTE ONLY): 30 min  Charges:  $Neuromuscular Re-education: 23-37 mins                     12:33 PM, 05/27/19 Etta Grandchild, PT, DPT Physical Therapist - [facility]  [phone] ([place])    Lilybelle Mayeda C 05/27/2019, 12:22 PM

## 2019-05-27 NOTE — TOC Progression Note (Signed)
Transition of Care [facility]) - Progression Note    Patient Details  Name: Gordon [name] MRN: [id number] Date of Birth: April 01, 1956  [facility] CM/SW Contact  Ross Ludwig, [place] Phone Number: 05/27/2019, 5:25 PM  Clinical Narrative:    CSW contacted, [facility] in [place], [place], [facility], [place], [place], [place], [place], [place], [place], [place], [place], [place] and none of the facilities are willing to take an LOG for this patient.  CSW contacted [facility], and left a message on voice mail, still waiting to hear back from them.  CSW notified Keane Scrape of [facility] team and explained the situation, Edwyna Ready said to email him, Terri TOC lead, and  Chase Caller are aware.  They will discuss patient and contact CSW tomorrow about a discharge plan for patient.  CSW to continue to follow patient's progress throughout discharge planning.      Expected Discharge Plan and Services  SNF verse home health charity                                                 Social Determinants of Health (SDOH) Interventions    Readmission Risk Interventions No flowsheet data found.

## 2019-05-27 NOTE — Progress Notes (Signed)
Patient ID: Gordon [name], male   DOB: 1955/12/30, 63 y.o.   MRN: [id number] [facility] PROGRESS NOTE  Gordon [name] [id number] DOB: 1955/09/01 DOA: 05/19/2019 PCP: System, Pcp Not In  HPI/Subjective: Patient doing a little bit better with coordination of his fingers.  He thinks he is doing better with his speaking and getting his words out.  Objective: Vitals:   05/27/19 0758 05/27/19 1521  BP: 134/81 132/69  Pulse: 72 74  Resp: 17 18  Temp: 98.6 F (37 C) 98.6 F (37 C)  SpO2: 97% 97%    Filed Weights   05/25/19 0402 05/26/19 0547 05/27/19 0405  Weight: 73.1 kg 73.5 kg 75.8 kg    ROS: Review of Systems  Constitutional: Negative for chills and fever.  Eyes: Negative for blurred vision.  Respiratory: Negative for cough and shortness of breath.   Cardiovascular: Negative for chest pain.  Gastrointestinal: Negative for abdominal pain, constipation, diarrhea, nausea and vomiting.  Genitourinary: Negative for dysuria.  Musculoskeletal: Negative for joint pain.  Neurological: Positive for focal weakness. Negative for dizziness and headaches.   Exam: Physical Exam  HENT:  Nose: No mucosal edema.  Mouth/Throat: No oropharyngeal exudate or posterior oropharyngeal edema.  Eyes: Pupils are equal, round, and reactive to light. Conjunctivae, EOM and lids are normal.  Neck: Carotid bruit is not present.  Cardiovascular: S1 normal and S2 normal. Exam reveals no gallop.  No murmur heard. Pulses:      Dorsalis pedis pulses are 2+ on the right side and 2+ on the left side.  Respiratory: No respiratory distress. He has no wheezes. He has no rhonchi. He has no rales.  GI: Soft. Bowel sounds are normal. There is no abdominal tenderness.  Musculoskeletal:     Right ankle: No swelling.     Left ankle: No swelling.  Lymphadenopathy:    He has no cervical adenopathy.  Neurological: He is alert. No cranial nerve deficit.  Power 5 out of 5 bilaterally.  Patient able to do finger  touch little bit better today than previously.  Still not quite normal but better.  Skin: Skin is warm. No rash noted. Nails show no clubbing.  Psychiatric:  Slow with some responses.      Data Reviewed: Basic Metabolic Panel: Recent Labs  Lab 05/21/19 0427 05/23/19 0850 05/25/19 0440 05/26/19 0530 05/27/19 0618  NA 137 137 139 138 137  K 3.8 4.2 3.9 4.0 4.1  CL 108 109 110 107 106  CO2 18* 20* 21* 21* 20*  GLUCOSE 89 111* 102* 91 100*  BUN 30* 28* 29* 28* 33*  CREATININE 2.01* 1.94* 1.77* 1.66* 1.76*  CALCIUM 8.1* 8.5* 8.6* 8.4* 8.5*   CBC: Recent Labs  Lab 05/21/19 0425 05/27/19 1311  WBC 10.2 9.8  HGB 12.0* 12.0*  HCT 36.4* 35.5*  MCV 91.5 88.1  PLT 278 258     Recent Results (from the past 240 hour(s))  SARS Coronavirus 2 by RT PCR (hospital order, performed in [facility] hospital lab) Nasopharyngeal Nasopharyngeal Swab     Status: None   Collection Time: 05/19/19 12:40 PM   Specimen: Nasopharyngeal Swab  Result Value Ref Range Status   SARS Coronavirus 2 NEGATIVE NEGATIVE Final    Comment: (NOTE) SARS-CoV-2 target nucleic acids are NOT DETECTED. The SARS-CoV-2 RNA is generally detectable in upper and lower respiratory specimens during the acute phase of infection. The lowest concentration of SARS-CoV-2 viral copies this assay can detect is 250 copies / mL. A negative result does  not preclude SARS-CoV-2 infection and should not be used as the sole basis for treatment or other patient management decisions.  A negative result may occur with improper specimen collection / handling, submission of specimen other than nasopharyngeal swab, presence of viral mutation(s) within the areas targeted by this assay, and inadequate number of viral copies (<250 copies / mL). A negative result must be combined with clinical observations, patient history, and epidemiological information. Fact Sheet for Patients:   StrictlyIdeas.no Fact Sheet for  Healthcare Providers: BankingDealers.co.za This test is not yet approved or cleared  by the [place] FDA and has been authorized for detection and/or diagnosis of SARS-CoV-2 by FDA under an Emergency Use Authorization (EUA).  This EUA will remain in effect (meaning this test can be used) for the duration of the COVID-19 declaration under Section 564(b)(1) of the Act, 21 U.S.C. section 360bbb-3(b)(1), unless the authorization is terminated or revoked sooner. Performed at [facility], [address] [place], [place] [address]   MRSA PCR Screening     Status: None   Collection Time: 05/19/19  3:57 PM   Specimen: Nasal Mucosa; Nasopharyngeal  Result Value Ref Range Status   MRSA by PCR NEGATIVE NEGATIVE Final    Comment:        The GeneXpert MRSA Assay (FDA approved for NASAL specimens only), is one component of a comprehensive MRSA colonization surveillance program. It is not intended to diagnose MRSA infection nor to guide or monitor treatment for MRSA infections. Performed at [facility], [address] [place], [place] [address]       Scheduled Meds: . apixaban  5 mg Oral BID  . atorvastatin  40 mg Oral q1800  . Chlorhexidine Gluconate Cloth  6 each Topical Daily  . clopidogrel  75 mg Oral Daily  . feeding supplement (ENSURE ENLIVE)  237 mL Oral BID BM  . hydrALAZINE  25 mg Oral TID  . isosorbide mononitrate  60 mg Oral Daily  . metoprolol tartrate  25 mg Oral Q6H  . sodium chloride flush  3 mL Intravenous Q12H   Continuous Infusions: . sodium chloride      Assessment/Plan:  1. 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  Repeat CT scan did not show any hemorrhage.  Patient having difficulty with processing words and saying what he wants to say.  Patient also has incoordination of the right arm.  I am still concerned about the patient living home alone.   Patient started on Eliquis 05/25/2019.  And is also on Plavix.  Transition care team looking into rehab.  Patient doing a little bit better with regards to his coordination today. 2. STEMI.  Medical management with Plavix, atorvastatin and Coreg. 3. Hyperlipidemia.  LDL 75.  Goal less than 70.  Atorvastatin prescribed 4. Chronic kidney disease stage IIIb.  Creatinine actually improving down to 1.76 today. 5. Paroxysmal atrial fibrillation.  Started on Eliquis 05/25/2019.  Had an episode of rapid atrial fibrillation yesterday but now in normal sinus rhythm.  On Coreg for rate control. 6. Cardiomyopathy with chronic systolic congestive heart failure.  No ACE or ARB with kidney injury.  No spironolactone with kidney injury.  On beta-blocker only.  Code Status:     Code Status Orders  (From admission, onward)         Start     Ordered   05/19/19 1459  Full code  Continuous     05/19/19 1459  Code Status History    This patient has a current code status but no historical code status.   Advance Care Planning Activity     Family Communication: Spoke with sister at the bedside. Disposition Plan: Concerned about patient living home alone.  Transition care team looking into rehab.  We have not heard back from any rehab at this point.  Unfortunately patient may have to go home with home health.  Consultants:  Cardiology  Neurology  Time spent: 28 minutes.  [place]  [facility] [employer]

## 2019-05-28 MED ORDER — METOPROLOL SUCCINATE ER 100 MG PO TB24
100.0000 mg | ORAL_TABLET | Freq: Every day | ORAL | Status: DC
  Administered 2019-05-28 – 2019-06-03 (×6): 100 mg via ORAL
  Filled 2019-05-28 (×6): qty 1

## 2019-05-28 NOTE — TOC Progression Note (Signed)
Transition of Care [facility]) - Progression Note    Patient Details  Name: Haines Morales MRN: [id number] Date of Birth: Aug 26, 1955  Transition of Care [facility]) CM/SW Contact  Ross Ludwig,  Phone Number: 05/28/2019, 3:33 PM  Clinical Narrative:     CSW spoke to [facility], and she wanted CIR to look at patient again.  CSW contacted Jhonnie Garner at [phone] from [facility] and requested they review patient again.  CSW informed her that so far there are not any other options for patient to go to SNF due to not having insurance, and it would not be a safe discharge back home.  CSW has contacted several SNFs and they are not able to accept patient due to him not having insurance.  CSW gave Claiborne Billings Kristen's phone number to speak with her to discuss case.     Expected Discharge Plan and Services   SNF verse CIR verse home health charity.                              Social Determinants of Health (SDOH) Interventions    Readmission Risk Interventions No flowsheet data found.

## 2019-05-28 NOTE — TOC Progression Note (Signed)
Transition of Care [facility]) - Progression Note    Patient Details  Name: Gordon [name] MRN: [id number] Date of Birth: [birth year]  [facility]) CM/SW Contact  Gordon [name], [name] Phone Number:  05/28/2019, 5:26 PM  Clinical Narrative:     CSW received phone call from [place] at [facility] per [place], initially corporate said yes, but then they called her back and said no they can not accept patient.  Per Gordon [name], the facility is concerned that patient may not get approved for Medicaid, and patient may not be able to return back home.  CSW spoke to [name] rehab supervisor [phone], and she feels he would still benefit from CIR.  PT requested that CSW contact CIR again to review patient again.  CSW contacted contacted Gordon [name] [phone] inpatient rehab admissions coordinator to see if they can review patient again.  She informed CSW that Gordon [name] [phone] will screen patient again.  Per rehab admissions worker patient is to high level for their program even though patient is still needing verbal and tactile cues for ADLs.  CSW contacted Gordon [name] from financial counseling, [phone] to see if she thinks patient will be approved for Medicaid, she stated that she has tried talking with the patient 7 times, but has not been able to speak to him because each time she called he was confused due to the stroke.  Patient was less confused this afternoon, CSW was able to facilitate communication with financial counseling for patient.  Patient was able to answer questions that financial counseling needed to continue working on the [employer] approval.  Patient gave permission for financial counseling to speak to his niece and sister about trying to get the required paperwork for him to be approved for Medicaid.  Gordon [name] said she was going to work on patient's case and will let CSW once she has some more information.  CSW has reached out to leadership to assist with trying to find  placement for this patient, CSW to continue to follow patient's progress throughout discharge planning.  5:40pm  CSW spoke to patient's niece Gordon [name] [phone] and updated her that CSW is still working on placement for patient.  CSW reminded her [place], she expressed understanding and is agreeable to where he can get a bed.  CSW also discussed with patient, and he is still in agreement for going to a SNF or CIR for short term rehab.  She stated financial counseling has spoken to her, and she is aware that they are working on trying to get patient approved for Medicaid.        Expected Discharge Plan and Services                                                 Social Determinants of Health (SDOH) Interventions    Readmission Risk Interventions No flowsheet data found.

## 2019-05-28 NOTE — Progress Notes (Signed)
Progress Note  Patient Name: Gordon [name] Date of Encounter: 05/28/2019  Primary Cardiologist: Kathlyn Sacramento, MD   Subjective   Patient seen sitting in chair just finished eating lunch.  States doing okay, denies chest pain, palpitations or shortness of breath.  Patient is still having some trouble with word finding and expression.  Inpatient Medications    Scheduled Meds: . apixaban  5 mg Oral BID  . atorvastatin  40 mg Oral q1800  . Chlorhexidine Gluconate Cloth  6 each Topical Daily  . clopidogrel  75 mg Oral Daily  . feeding supplement (ENSURE ENLIVE)  237 mL Oral BID BM  . hydrALAZINE  25 mg Oral TID  . isosorbide mononitrate  60 mg Oral Daily  . metoprolol tartrate  25 mg Oral Q6H  . sodium chloride flush  3 mL Intravenous Q12H   Continuous Infusions: . sodium chloride     PRN Meds: sodium chloride, acetaminophen, metoprolol tartrate, ondansetron (ZOFRAN) IV, sodium chloride flush   Vital Signs    Vitals:   05/27/19 1521 05/27/19 2049 05/28/19 0450 05/28/19 0801  BP: 132/69 (!) 165/99 137/84 (!) 141/89  Pulse: 74 80 70 73  Resp: 18 16 20 18   Temp: 98.6 F (37 C) 98.8 F (37.1 C) 98.5 F (36.9 C) 98.5 F (36.9 C)  TempSrc: Oral Oral Oral Oral  SpO2: 97% 98% 97% 98%  Weight:   75.6 kg   Height:        Intake/Output Summary (Last 24 hours) at 05/28/2019 1305 Last data filed at 05/28/2019 1211 Gross per 24 hour  Intake 240 ml  Output 675 ml  Net -435 ml   Last 3 Weights 05/28/2019 05/27/2019 05/26/2019  Weight (lbs) 166 lb 9.6 oz 167 lb 162 lb 1.6 oz  Weight (kg) 75.569 kg 75.751 kg 73.528 kg      Telemetry    Sinus rhythm, occasional PACs- Personally Reviewed  ECG    Not performed today- Personally Reviewed  Physical Exam   GEN: No acute distress.   Neck: No JVD Cardiac: RRR, no murmurs, rubs, or gallops.  Respiratory: Clear to auscultation bilaterally. GI: Soft, nontender, non-distended  MS: No edema; No deformity. Neuro:  Nonfocal    Psych: Normal affect   Labs    High Sensitivity Troponin:   Recent Labs  Lab 05/19/19 1026 05/19/19 1446 05/19/19 1720  TROPONINIHS 3,929* 3,682* 3,729*      Chemistry Recent Labs  Lab 05/25/19 0440 05/26/19 0530 05/27/19 0618  NA 139 138 137  K 3.9 4.0 4.1  CL 110 107 106  CO2 21* 21* 20*  GLUCOSE 102* 91 100*  BUN 29* 28* 33*  CREATININE 1.77* 1.66* 1.76*  CALCIUM 8.6* 8.4* 8.5*  GFRNONAA 40* 43* 40*  GFRAA 46* 50* 46*  ANIONGAP 8 10 11      Hematology Recent Labs  Lab 05/27/19 1311  WBC 9.8  RBC 4.03*  HGB 12.0*  HCT 35.5*  MCV 88.1  MCH 29.8  MCHC 33.8  RDW 14.6  PLT 258    BNPNo results for input(s): BNP, PROBNP in the last 168 hours.   DDimer No results for input(s): DDIMER in the last 168 hours.   Radiology    No results found.  Cardiac Studies   Cardiac Catheterization and Percutaneous Coronary Intervention12.7.2020  Left Anterior Descending  Ost LAD to Prox LAD lesion 30% stenosed  Ost LAD to Prox LAD lesion is 30% stenosed.  Prox LAD to Mid LAD lesion 30% stenosed  Prox  LAD to Mid LAD lesion is 30% stenosed.  Mid LAD to Dist LAD lesion 60% stenosed  Mid LAD to Dist LAD lesion is 60% stenosed.  Left Circumflex  There is mild diffuse disease throughout the vessel.  Left Posterior Atrioventricular Artery  The vessel exhibits minimal luminal irregularities.  Right Coronary Artery  Prox RCA to Mid RCA lesion 100% stenosed  Prox RCA to Mid RCA lesion is 100% stenosed. Vessel is the culprit lesion. The lesion is type C and thrombotic with left-to-right collateral flow. The lesion is severely calcified.  Right Posterior Descending Artery  Collaterals  RPDA filled by collaterals from Dist LAD.    RPDA lesion 90% stenosed  RPDA lesion is 90% stenosed.  EF of 30 to 35% with akinesis of the inferior wall.   **Unsuccessful balloon angioplasty of the right coronary artery given diffuse disease, calcifications and thrombus. This was in  spite of multiple balloon inflations throughout the whole vessel.  _____________  2D Echocardiogram  1. Left ventricular ejection fraction, by visual estimation, is 30 to 35%. The left ventricle has moderate to severely decreased function. There is no left ventricular hypertrophy. 2. Left ventricular diastolic parameters are consistent with Grade II diastolic dysfunction (pseudonormalization). 3. There is severe hypokinesis of the inferior and inferoseptal walls. 4. There is septal "bounce" suggestive of increased intrapericardial pressure or constriction. 5. Global right ventricle has moderately reduced systolic function.The right ventricular size is mildly enlarged. No increase in right ventricular wall thickness. 6. Left atrial size was normal. 7. Right atrial size was mildly dilated. 8. Small pericardial effusion. 9. Mild to moderate mitral annular calcification. 10. The mitral valve is degenerative. Mild mitral valve regurgitation. No evidence of mitral stenosis. 11. The tricuspid valve is not well visualized. Tricuspid valve regurgitation mild-moderate. 12. The aortic valve is tricuspid. Aortic valve regurgitation is not visualized. Mild to moderate aortic valve sclerosis/calcification without any evidence of aortic stenosis. 13. The pulmonic valve was not well visualized. Pulmonic valve regurgitation is not visualized. 14. Mildly dilated pulmonary artery. 15. Mildly elevated pulmonary artery systolic pressure. 16. The inferior vena cava is dilated in size with <50% respiratory variability, suggesting right atrial pressure of 15 mmHg. _____________   Patient Profile     63 y.o. male with history of diabetes, tobacco use, hypertension presenting with chest pain.  Found to have late presenting inferior MI with heavily calcified RCA [place] balloon angioplasty.  Hospital course complicated by left MCA infarct.  Assessment & Plan    1.  CAD status post balloon angioplasty  to RCA -Plavix, Lipitor, Toprol-XL 100 daily, Imdur 60  2.  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 -Avoiding ACE/ARB since setting of AKI  3.  Paroxysmal atrial fibrillation -Currently in sinus -Toprol-XL 100 mg daily, Eliquis 5 twice daily.  4.  CVA -Plavix, Lipitor as above. -Management as per primary team.    Signed, Kate Sable, MD  05/28/2019, 1:05 PM

## 2019-05-28 NOTE — Progress Notes (Signed)
Rehab Admissions Coordinator Note:  Per PT recommendation, patient was screened by Michel Santee, PT, DPT for appropriateness for an Inpatient Acute Rehab Consult.  Note pt currently mobilizing >500' with min guard, no assist level documented in OT note but appears pt supervision and requiring verbal/tactile cues for ADLs.  24/7 supervision would be recommended goal level for this patient, given deficits with aphasia and awareness. Since his goals would be supervision, and he is already mobilizing at or near this level, he is too high level for our program and is not a candidate.    Michel Santee, PT, DPT 05/28/2019, 3:32 PM  I can be reached at [phone].

## 2019-05-28 NOTE — Progress Notes (Signed)
PROGRESS NOTE    Gordon [name]  [id number] DOB: [birth year] DOA: 05/19/2019 PCP: System, Pcp Not In     Assessment & Plan:   Active Problems:   ST elevation myocardial infarction (STEMI) (HCC)   Atrial fibrillation with rapid ventricular response (HCC)   HFrEF (heart failure with reduced ejection fraction) (HCC)   Acute CVA (cerebrovascular accident) ([place])   Aphasia   PAF (paroxysmal atrial fibrillation) ([place])   Acute renal failure superimposed on stage 3a chronic kidney disease (HCC)   Stage 3b chronic kidney disease  Acute cortical/subcortical infarct within the left MCA vascular territory with small petechial hemorrhage: Repeat CT scan did not show any hemorrhage. Continue on eliquis, plavix. Continue neuro checks.   STEMI: s/p balloon angioplasty to RCA. Continue w/  medical management with plavix, atorvastatin and Coreg. Continue on tele. Cardio following and recs apprec  Hyperlipidemia: continue on statin  Chronic kidney disease stage IIIb:  avoid nephrotoxic meds. Will continue to monitor  Paroxysmal atrial fibrillation: continue on coreg, eliquis. Continue on tele  Cardiomyopathy with chronic systolic congestive heart failure: No ACE or ARB with kidney injury.  No spironolactone with kidney injury. Continue on coreg, imdur, hydralazine \  Generalized weakness: PT is recommending SNF. OT is recommending CIR. CM aware. Pt lives alone and unlikely to do well at home alone.    DVT prophylaxis: eliquis Code Status: full  Family Communication:  Disposition Plan: possibly to SNF when stable   Consultants:  Cardio   Procedures: n/a   Antimicrobials: n/a   Subjective: Pt c/o weakness. Pt is having difficulty word finding.   Objective: Vitals:   05/27/19 1521 05/27/19 2049 05/28/19 0450 05/28/19 0801  BP: 132/69 (!) 165/99 137/84 (!) 141/89  Pulse: 74 80 70 73  Resp: 18 16 20 18   Temp: 98.6 F (37 C) 98.8 F (37.1 C) 98.5 F (36.9 C) 98.5 F (36.9 C)    TempSrc: Oral Oral Oral Oral  SpO2: 97% 98% 97% 98%  Weight:   75.6 kg   Height:        Intake/Output Summary (Last 24 hours) at 05/28/2019 1636 Last data filed at 05/28/2019 1211 Gross per 24 hour  Intake 240 ml  Output 575 ml  Net -335 ml   Filed Weights   05/26/19 0547 05/27/19 0405 05/28/19 0450  Weight: 73.5 kg 75.8 kg 75.6 kg    Examination:  General exam: Appears calm and comfortable  Respiratory system: diminished breath sounds b/l. No rales  Cardiovascular system: S1 & S2 normal. No  rubs, gallops or clicks. Gastrointestinal system: Abdomen is nondistended, soft and nontender.  Normal bowel sounds heard. Central nervous system: Alert and oriented. Moves all 4 extremities Psychiatry: flat mood and affect    Data Reviewed: I have personally reviewed following labs and imaging studies  CBC: Recent Labs  Lab 05/27/19 1311  WBC 9.8  HGB 12.0*  HCT 35.5*  MCV 88.1  PLT 0000000   Basic Metabolic Panel: Recent Labs  Lab 05/23/19 0850 05/25/19 0440 05/26/19 0530 05/27/19 0618  NA 137 139 138 137  K 4.2 3.9 4.0 4.1  CL 109 110 107 106  CO2 20* 21* 21* 20*  GLUCOSE 111* 102* 91 100*  BUN 28* 29* 28* 33*  CREATININE 1.94* 1.77* 1.66* 1.76*  CALCIUM 8.5* 8.6* 8.4* 8.5*   GFR: Estimated Creatinine Clearance: 41 mL/min (A) (by C-G formula based on SCr of 1.76 mg/dL (H)). Liver Function Tests: No results for input(s): AST, ALT, ALKPHOS, BILITOT, PROT,  ALBUMIN in the last 168 hours. No results for input(s): LIPASE, AMYLASE in the last 168 hours. No results for input(s): AMMONIA in the last 168 hours. Coagulation Profile: No results for input(s): INR, PROTIME in the last 168 hours. Cardiac Enzymes: No results for input(s): CKTOTAL, CKMB, CKMBINDEX, TROPONINI in the last 168 hours. BNP (last 3 results) No results for input(s): PROBNP in the last 8760 hours. HbA1C: No results for input(s): HGBA1C in the last 72 hours. CBG: No results for input(s): GLUCAP in  the last 168 hours. Lipid Profile: No results for input(s): CHOL, HDL, LDLCALC, TRIG, CHOLHDL, LDLDIRECT in the last 72 hours. Thyroid Function Tests: No results for input(s): TSH, T4TOTAL, FREET4, T3FREE, THYROIDAB in the last 72 hours. Anemia Panel: No results for input(s): VITAMINB12, FOLATE, FERRITIN, TIBC, IRON, RETICCTPCT in the last 72 hours. Sepsis Labs: No results for input(s): PROCALCITON, LATICACIDVEN in the last 168 hours.  Recent Results (from the past 240 hour(s))  SARS Coronavirus 2 by RT PCR (hospital order, performed in [facility] hospital lab) Nasopharyngeal Nasopharyngeal Swab     Status: None   Collection Time: 05/19/19 12:40 PM   Specimen: Nasopharyngeal Swab  Result Value Ref Range Status   SARS Coronavirus 2 NEGATIVE NEGATIVE Final    Comment: (NOTE) SARS-CoV-2 target nucleic acids are NOT DETECTED. The SARS-CoV-2 RNA is generally detectable in upper and lower respiratory specimens during the acute phase of infection. The lowest concentration of SARS-CoV-2 viral copies this assay can detect is 250 copies / mL. A negative result does not preclude SARS-CoV-2 infection and should not be used as the sole basis for treatment or other patient management decisions.  A negative result may occur with improper specimen collection / handling, submission of specimen other than nasopharyngeal swab, presence of viral mutation(s) within the areas targeted by this assay, and inadequate number of viral copies (<250 copies / mL). A negative result must be combined with clinical observations, patient history, and epidemiological information. Fact Sheet for Patients:   StrictlyIdeas.no Fact Sheet for Healthcare Providers: BankingDealers.co.za This test is not yet approved or cleared  by the [place] FDA and has been authorized for detection and/or diagnosis of SARS-CoV-2 by FDA under an Emergency Use Authorization (EUA).  This  EUA will remain in effect (meaning this test can be used) for the duration of the COVID-19 declaration under Section 564(b)(1) of the Act, 21 U.S.C. section 360bbb-3(b)(1), unless the authorization is terminated or revoked sooner. Performed at [facility], [address] [place], [place] [address]   MRSA PCR Screening     Status: None   Collection Time: 05/19/19  3:57 PM   Specimen: Nasal Mucosa; Nasopharyngeal  Result Value Ref Range Status   MRSA by PCR NEGATIVE NEGATIVE Final    Comment:        The GeneXpert MRSA Assay (FDA approved for NASAL specimens only), is one component of a comprehensive MRSA colonization surveillance program. It is not intended to diagnose MRSA infection nor to guide or monitor treatment for MRSA infections. Performed at [facility], [address] [place], [place] [address]          Radiology Studies: No results found.      Scheduled Meds: . apixaban  5 mg Oral BID  . atorvastatin  40 mg Oral q1800  . Chlorhexidine Gluconate Cloth  6 each Topical Daily  . clopidogrel  75 mg Oral Daily  . feeding supplement (ENSURE ENLIVE)  237 mL Oral BID BM  .  hydrALAZINE  25 mg Oral TID  . isosorbide mononitrate  60 mg Oral Daily  . metoprolol succinate  100 mg Oral Daily  . sodium chloride flush  3 mL Intravenous Q12H   Continuous Infusions: . sodium chloride       LOS: 9 days    Time spent: 35  mins    Wyvonnia Dusky, MD [facility] Pager [phone]  If 7PM-7AM, please contact night-coverage [email or web address] Password [facility] 05/28/2019, 4:36 PM

## 2019-05-28 NOTE — Progress Notes (Signed)
Physical Therapy Treatment Patient Details Name: Gordon [name] MRN: [id number] DOB: 1956-01-24 Today's Date: 05/28/2019    History of Present Illness Gordon [name] is a 56yoM who comes to [facility] 12/7 c CP and gen weakness, notable HTN and AFcRVR, pt admitted c STEMI, HeadCT negaitve. Pt underwent a cardiac catheterization on 12/7 status post unsuccessful balloon angioplasty of the RCA due to diffuse disease, calcification and thrombus. Pt having expressive difficulty and motor control impairment of the right hemibody. Head CT/MRI showing acute CVA. PTA pt was fully independent, retired but looking ino to working part time in the future.    PT Comments    Patient exceptionally motivated and demonstrates excellent effort with all tasks during session.  Trialed gait without assist device this PM, with patient able to complete with cga/min assist from therapist.  Inconsistent R LE foot placement, limited awareness of R UE position during gait trial, but much improved from previous sessions.  Worked to include speech/cognitive tasks into gait training as appropriate; marked deterioration in ability/performance with fatigue and divided attention. Patient continues with good functional strength in R UE, but complete lack of sensory awareness (with exception of mild deep pressure to R nail beds) results in significant coordination/motor control deficits and overall functional use of R hemi-body at times.  Noted improvement in use/performance with sustained visual attention to R UE/LE, but difficulty maintaining with dual-task activities.    Follow Up Recommendations  SNF;Supervision for mobility/OOB;Supervision/Assistance - 24 hour     Equipment Recommendations  Rolling walker with 5" wheels    Recommendations for Other Services       Precautions / Restrictions Precautions Precautions: Fall Restrictions Weight Bearing Restrictions: No    Mobility  Bed Mobility Overal bed mobility: Modified  Independent                Transfers Overall transfer level: Needs assistance   Transfers: Sit to/from Stand Sit to Stand: Min guard;Min assist         General transfer comment: R LE extended and completely out of contact with ground surface during initial stand; corrected with cuing for visual scanning of environment and LEs prior to all mobility efforts  Ambulation/Gait Ambulation/Gait assistance: Min guard;Min assist Gait Distance (Feet): (>300)         General Gait Details: reciprocal stepping pattern with decreased weight shift to R LE; inconsistent R foot placement with mild/mod R lateral sway during dynamic activities at times.  R UE flexes with exertion, limiting trunk rotation adn arm swing; improved with visual attention to R UE position (for relaxation and neutral position)   Stairs             Wheelchair Mobility    Modified Rankin (Stroke Patients Only)       Balance   Sitting-balance support: No upper extremity supported;Feet supported Sitting balance-Leahy Scale: Good     Standing balance support: During functional activity;No upper extremity supported Standing balance-Leahy Scale: Fair                              Cognition Arousal/Alertness: Awake/alert Behavior During Therapy: WFL for tasks assessed/performed                                   General Comments: follows commands and makes great efforts to integrate cuing/education; somewhat impulsive with limited attention to R hemi-body at  times      Exercises Other Exercises Other Exercises: Integrated various speech/cognitive activities into gait training-patient completing word association cues, sequenctial letters of the alphabet, all with 75% accuracy.  Intermittent cuing for attention/awareness of R UE position throughout.  Further progressed to include additioanl RUE task (holding cup of ice) for attention to R UE during functional activity. Marked  deterioration of speech/verbal abilities with divided attention; difficulty modulating R UE grip strength due to sensory deficits. Other Exercises: Unsupported sitting edge of bed, worked on various reaching/grasping tasks (grabbing, moving cup; using spoon to move ice chips) to promote awareness and functional use of R UE, modulated grip strength and UE control.  Marked improvement in control and overall ability with isolated task and with constant visual attention to R UE. Other Exercises: Extensive education and practice in HEP for UE control and awareness-removing caps from bottles in room, folding laundry, using R UE to feed self.  Patient able demonstrate all tasks with increased time.    General Comments        Pertinent Vitals/Pain Pain Assessment: No/denies pain    Home Living                      Prior Function            PT Goals (current goals can now be found in the care plan section) Acute Rehab PT Goals Patient Stated Goal: Want to get better and want to go to rehab for a lot of therapy PT Goal Formulation: With patient Time For Goal Achievement: 06/04/19 Potential to Achieve Goals: Fair Progress towards PT goals: Progressing toward goals    Frequency    7X/week      PT Plan Current plan remains appropriate    Co-evaluation              AM-PAC PT "6 Clicks" Mobility   Outcome Measure  Help needed turning from your back to your side while in a flat bed without using bedrails?: None Help needed moving from lying on your back to sitting on the side of a flat bed without using bedrails?: None Help needed moving to and from a bed to a chair (including a wheelchair)?: A Little Help needed standing up from a chair using your arms (e.g., wheelchair or bedside chair)?: A Little Help needed to walk in hospital room?: A Little Help needed climbing 3-5 steps with a railing? : A Little 6 Click Score: 20    End of Session Equipment Utilized During  Treatment: Gait belt Activity Tolerance: Patient tolerated treatment well;No increased pain Patient left: with call bell/phone within reach;in chair;with chair alarm set Nurse Communication: Mobility status PT Visit Diagnosis: Unsteadiness on feet (R26.81);Difficulty in walking, not elsewhere classified (R26.2);Apraxia (R48.2);Ataxic gait (R26.0);Other abnormalities of gait and mobility (R26.89)     Time: [id number] PT Time Calculation (min) (ACUTE ONLY): 54 min  Charges:  $Gait Training: 23-37 mins $Neuromuscular Re-education: 23-37 mins                    Deeann Servidio H. Owens Shark, PT, DPT, NCS 05/28/19, 7:45 PM [phone]

## 2019-05-29 DIAGNOSIS — N1832 Chronic kidney disease, stage 3b: Secondary | ICD-10-CM

## 2019-05-29 DIAGNOSIS — I6330 Cerebral infarction due to thrombosis of unspecified cerebral artery: Secondary | ICD-10-CM

## 2019-05-29 LAB — BASIC METABOLIC PANEL
Anion gap: 9 (ref 5–15)
BUN: 35 mg/dL — ABNORMAL HIGH (ref 8–23)
CO2: 20 mmol/L — ABNORMAL LOW (ref 22–32)
Calcium: 8.7 mg/dL — ABNORMAL LOW (ref 8.9–10.3)
Chloride: 109 mmol/L (ref 98–111)
Creatinine, Ser: 1.77 mg/dL — ABNORMAL HIGH (ref 0.61–1.24)
GFR calc Af Amer: 46 mL/min — ABNORMAL LOW (ref >60)
GFR calc non Af Amer: 40 mL/min — ABNORMAL LOW (ref >60)
Glucose, Bld: 100 mg/dL — ABNORMAL HIGH (ref 70–99)
Potassium: 3.8 mmol/L (ref 3.5–5.1)
Sodium: 138 mmol/L (ref 135–145)

## 2019-05-29 LAB — CBC
HCT: 34.1 % — ABNORMAL LOW (ref 39.0–52.0)
Hemoglobin: 10.8 g/dL — ABNORMAL LOW (ref 13.0–17.0)
MCH: 29.3 pg (ref 26.0–34.0)
MCHC: 31.7 g/dL (ref 30.0–36.0)
MCV: 92.4 fL (ref 80.0–100.0)
Platelets: 245 10*3/uL (ref 150–400)
RBC: 3.69 MIL/uL — ABNORMAL LOW (ref 4.22–5.81)
RDW: 14.5 % (ref 11.5–15.5)
WBC: 9 10*3/uL (ref 4.0–10.5)
nRBC: 0 % (ref 0.0–0.2)

## 2019-05-29 NOTE — TOC Progression Note (Signed)
Transition of Care [facility]) - Progression Note    Patient Details  Name: Tiam Neller MRN: [id number] Date of Birth: 1956/04/28  Transition of Care [facility]) CM/SW Contact  Ross Ludwig, [place] Phone Number: 05/29/2019, 6:15 PM  Clinical Narrative:    CIR reviewed patient's case again and they declined him for inpatient rehab.  CSW spoke to [place] at [facility], and she will review patient again.  CSW informed her that financial assistance saw patient yesterday and think he will be able to get approved, but just waiting for verification.  CSW to continue to follow patient's progress throughout discharge planning.     Expected Discharge Plan and Services  Patient to hopefully go to SNF for short term rehab.                                               Social Determinants of Health (SDOH) Interventions    Readmission Risk Interventions No flowsheet data found.

## 2019-05-29 NOTE — Progress Notes (Signed)
Progress Note  Patient Name: Gordon [name] Date of Encounter: 05/29/2019  Primary Cardiologist: Fletcher Anon  Subjective   No chest pain, SOB, or palpitations. "I feel so much better."  Inpatient Medications    Scheduled Meds: . apixaban  5 mg Oral BID  . atorvastatin  40 mg Oral q1800  . Chlorhexidine Gluconate Cloth  6 each Topical Daily  . clopidogrel  75 mg Oral Daily  . feeding supplement (ENSURE ENLIVE)  237 mL Oral BID BM  . hydrALAZINE  25 mg Oral TID  . isosorbide mononitrate  60 mg Oral Daily  . metoprolol succinate  100 mg Oral Daily  . sodium chloride flush  3 mL Intravenous Q12H   Continuous Infusions: . sodium chloride     PRN Meds: sodium chloride, acetaminophen, metoprolol tartrate, ondansetron (ZOFRAN) IV, sodium chloride flush   Vital Signs    Vitals:   05/28/19 0801 05/28/19 1953 05/29/19 0343 05/29/19 0812  BP: (!) 141/89 (!) 145/87 115/71 140/89  Pulse: 73 82 68 77  Resp: 18 18 18    Temp: 98.5 F (36.9 C) 98.2 F (36.8 C) 98.6 F (37 C) 97.7 F (36.5 C)  TempSrc: Oral Oral Oral Oral  SpO2: 98% 97% 98% 98%  Weight:      Height:        Intake/Output Summary (Last 24 hours) at 05/29/2019 0948 Last data filed at 05/29/2019 [id number] Gross per 24 hour  Intake 243 ml  Output 300 ml  Net -57 ml   Filed Weights   05/26/19 0547 05/27/19 0405 05/28/19 0450  Weight: 73.5 kg 75.8 kg 75.6 kg    Telemetry    SR with occasional PVCs, ventricular bigeminy - Personally Reviewed  ECG    No new tracings - Personally Reviewed  Physical Exam   GEN: No acute distress.   Neck: No JVD. Cardiac: RRR, no murmurs, rubs, or gallops. Right radial cath site is well healing without bleeding, swelling, warmth, erythema, or TTP. Radial pulse 2+.  Respiratory: Clear to auscultation bilaterally.  GI: Soft, nontender, non-distended.   MS: No edema; No deformity. Neuro:  Alert and oriented x 3; Nonfocal.  Psych: Normal affect.  Labs    Chemistry Recent Labs    Lab 05/26/19 0530 05/27/19 0618 05/29/19 0418  NA 138 137 138  K 4.0 4.1 3.8  CL 107 106 109  CO2 21* 20* 20*  GLUCOSE 91 100* 100*  BUN 28* 33* 35*  CREATININE 1.66* 1.76* 1.77*  CALCIUM 8.4* 8.5* 8.7*  GFRNONAA 43* 40* 40*  GFRAA 50* 46* 46*  ANIONGAP 10 11 9      Hematology Recent Labs  Lab 05/27/19 1311 05/29/19 0418  WBC 9.8 9.0  RBC 4.03* 3.69*  HGB 12.0* 10.8*  HCT 35.5* 34.1*  MCV 88.1 92.4  MCH 29.8 29.3  MCHC 33.8 31.7  RDW 14.6 14.5  PLT 258 245    Cardiac EnzymesNo results for input(s): TROPONINI in the last 168 hours. No results for input(s): TROPIPOC in the last 168 hours.   BNPNo results for input(s): BNP, PROBNP in the last 168 hours.   DDimer No results for input(s): DDIMER in the last 168 hours.   Radiology    No results found.  Cardiac Studies    LHC 05/19/2019: There is moderate to severe left ventricular systolic dysfunction.  LV end diastolic pressure is mildly elevated.  Ost LAD to Prox LAD lesion is 30% stenosed.  Prox LAD to Mid LAD lesion is 30% stenosed.  Mid LAD to Dist LAD lesion is 60% stenosed.  Prox RCA to Mid RCA lesion is 100% stenosed.  RPDA lesion is 90% stenosed.  Post intervention, there is a 80% residual stenosis.  Balloon angioplasty was performed using a BALLOON TREK RX [id number].   1.  Codominant coronary arteries with severe one-vessel coronary artery disease.  The culprit is an occluded proximal right coronary artery which is diffusely diseased and heavily calcified.  Left to right collaterals were noted. 2.  Moderately to severely reduced LV systolic function with an EF of 30 to 35% with akinesis of the inferior wall. 3.  Mildly elevated left ventricular end-diastolic pressure at 21 mmHg. 4.  Unsuccessful balloon angioplasty of the right coronary artery given diffuse disease, calcifications and thrombus.  This was in spite of multiple balloon inflations throughout the whole  vessel.  Recommendations: Based on the patient's history and angiographic findings, I suspect that this is a late presentation inferior ST elevation myocardial infarction or acute on chronic presentation.  Given late presentation, underlying renal failure of unknown chronicity and the fact that the patient was chest pain-free, I elected not to prolong the procedure anymore and accepted results. Recommend medical therapy.  I was also concerned about the patient's continuous right arm movement in spite of instructed him not to do so. __________  2D Echo 05/20/2019: 1. Left ventricular ejection fraction, by visual estimation, is 30 to 35%. The left ventricle has moderate to severely decreased function. There is no left ventricular hypertrophy.  2. Left ventricular diastolic parameters are consistent with Grade II diastolic dysfunction (pseudonormalization).  3. There is severe hypokinesis of the inferior and inferoseptal walls.  4. There is septal "bounce" suggestive of increased intrapericardial pressure or constriction.  5. Global right ventricle has moderately reduced systolic function.The right ventricular size is mildly enlarged. No increase in right ventricular wall thickness.  6. Left atrial size was normal.  7. Right atrial size was mildly dilated.  8. Small pericardial effusion.  9. Mild to moderate mitral annular calcification. 10. The mitral valve is degenerative. Mild mitral valve regurgitation. No evidence of mitral stenosis. 11. The tricuspid valve is not well visualized. Tricuspid valve regurgitation mild-moderate. 12. The aortic valve is tricuspid. Aortic valve regurgitation is not visualized. Mild to moderate aortic valve sclerosis/calcification without any evidence of aortic stenosis. 13. The pulmonic valve was not well visualized. Pulmonic valve regurgitation is not visualized. 14. Mildly dilated pulmonary artery. 15. Mildly elevated pulmonary artery systolic pressure. 16. The  inferior vena cava is dilated in size with <50% respiratory variability, suggesting right atrial pressure of 15 mmHg.   Patient Profile     63 y.o. male with history of diabetes, HTN, and tobacco use admitted with late presenting inferior MI complicated by left MCA territory infarct.   Assessment & Plan    1. Late presenting inferior MI: -Status post unsuccessful PTCA of the RCA given diffuse disease, calcifications, and thrombus with medical management advised in the setting of acute on chronic late presentation, patient was chest pain free, and underlying renal failure of unknown chronicity, as well as noncompliance with right arm moving during the case -No chest pain -Continue Plavix with Eliquis in place of ASA as below in an effort to minimize triple therapy and bleeding risk for 12 months, if tolerated  -Cardiac rehab -Post cath instructions   2. HFrEF secondary to ICM: -He appears euvolemic and well compensated  -Add hydralazine to Imdur -Not currently on ACE-I/ARB/Entresto/spironolactone in the setting of  CKD -Escalate GDMT as tolerated/able -Continue Toprol XL CHF education  3. PAF: -Maintaining sinus rhythm -Continue Toprol XL -Eliquis 5 mg bid (he does not meet reduced dosing criteria) -CHADS2VASc at least 6 (CHF, HTN, DM, stroke x 2, vascular disease) -HGB down from 12 to 10.8 this morning, continue to monitor  4. CVA: -Plavix -Liptior -Primary service   5. Anemia: -HGB down from 12 to 10.8 this morning -Monitor as he is on Eliquis and Plavix -Per primary service   6. CKS stage II-III: -Stable  7. HTN: -Blood pressure has been in the 0000000 systolic mostly -Add hydralazine as above  -Continue Toprol XL and Imdur  8. HLD: -LDL of 75 this admission with a goal LDL of < 70 -Continue high dose Lipitor -Follow up fasting lipid panel and liver function as an outpatient in ~ 8-12 weeks  Dispo: -He continues to await placement -Follow up in our office 7-14  days after discharge    For questions or updates, please contact [facility] Please consult [email or web address] for contact info under Cardiology/STEMI.    Signed, Christell Faith, PA-C [facility] Pager: [phone] 05/29/2019, 9:48 AM

## 2019-05-29 NOTE — Progress Notes (Signed)
  Speech Language Pathology Treatment: Cognitive-Linquistic  Patient Details Name: Gordon [name] MRN: [id number] DOB: 01-May-1956 Today's Date: 05/29/2019 Time: [id number] SLP Time Calculation (min) (ACUTE ONLY): 48 min  Assessment / Plan / Recommendation Clinical Impression  Pt presents with expressive Aphasia and Apraxia affecting his ability to communicate needs and wants. Treatment today was focused on use of compensatory strategies to elicit words. Pt became frustrated when not able to produce the word often stating " I know it!' over and over. When given cues to stop, take a breath and start over, accuracy improved. Phonemic cues were occasionally helpful. Visual cues were often helpful. Noted phonemic perseveartions with naming and word repetition. Pt had moderate success with completing short phrases with one word responses. Pt was also instructed to gesture needs and wants when having difficulty communication. Gordon [name] was extremely motivated and requested that ST come back as much as possible so he can work hard.  HPI: Gordon [name] is a 53yoM who came to [facility] 12/7 c CP and gen weakness, notable HTN and AFcRVR. Pt underwent a cardiac catheterization on 12/7.  Head CT/MRI showing acute CVA. Pt is being seen by ST for new onset of expressive Aphasia.          SLP Plan  Continue with current plan of care. Treatment for expressive deficits while here at [facility]       Recommendations   Inpatient rehab. Will need extensive ST at discharge.                Plan: Continue with current plan of care       Gordon [name] 05/29/2019, 2:49 PM

## 2019-05-29 NOTE — Progress Notes (Signed)
Physical Therapy Treatment Patient Details Name: Gordon [name] MRN: [id number] DOB: 1955-10-06 Today's Date: 05/29/2019    History of Present Illness Devontae Casasola is a 25yoM who comes to [facility] 12/7 c CP and gen weakness, notable HTN and AFcRVR, pt admitted c STEMI, HeadCT negaitve. Pt underwent a cardiac catheterization on 12/7 status post unsuccessful balloon angioplasty of the RCA due to diffuse disease, calcification and thrombus. Pt having expressive difficulty and motor control impairment of the right hemibody. Head CT/MRI showing acute CVA. PTA pt was fully independent, retired but looking ino to working part time in the future.    PT Comments    Pt in bed upon arrival, agreeable to participate. Pt reports to be doing well, Chief Strategy Officer notes pt appears tired/drowsy, which pt confirms, but this improves with mobility. Pt AMB around unit 4 laps without RW, minGuard assist provided for safety, albeit no gross LOB. Pt's confidence is mildly impaired but grows with continued performance. Pt does a better job at visually scanning environment and managing obstacles. During AMB HR remains in 80sBPM. Gait speed continues to show gradual improvement up 30% from 5 days ago. After AMB and recovery, session focused on RUE functional activities for home maintenance, all standing and unsupported, including but not limited to managing blinds, light switches, sink, bed controls, moving furniture, and picking up trash from floor. At end of session, pt assisted with successful voiding of urine in BR. Pt continues to have episodic lability and high level frustration when desire to verbally convey complex thoughts is hindered by Broca's aphasia, pt given emotional support and he is able to collect himself fairly quickly. Also of note, pt still struggling with reading of text with <30% accuracy. Pt remains somewhat vigilant about setup of belonging at end of session, somewhat perseverating, but likely a result of feeling as  though he will later be unable to verbalize these needed. All needs met, pt made comfortable.     Follow Up Recommendations  SNF;Supervision for mobility/OOB;Supervision/Assistance - 24 hour     Equipment Recommendations  Rolling walker with 5" wheels    Recommendations for Other Services       Precautions / Restrictions Precautions Precautions: Fall Restrictions Weight Bearing Restrictions: No    Mobility  Bed Mobility Overal bed mobility: Modified Independent Bed Mobility: Supine to Sit;Sit to Supine     Supine to sit: Supervision Sit to supine: Supervision   General bed mobility comments: multimodal cues for problem solving as RLE awareness is limited and if often becomes tangled in linen  Transfers Overall transfer level: Needs assistance Equipment used: None Transfers: Sit to/from Stand Sit to Stand: Supervision;Min guard         General transfer comment: cues to maintain awareness of RLE with transfers which he is able to demonstrate  Ambulation/Gait Ambulation/Gait assistance: Min guard Gait Distance (Feet): 720 Feet(first session wherein no assistive device is used for AMB) Assistive device: None Gait Pattern/deviations: Step-through pattern Gait velocity: 0.59ms on 12/17; 0.738m on 12/15; 0.6511mon 12/13   General Gait Details: 1x cues for visual scanning Rt field of view for obstacles in hallway, which he does well, intemrittent selective RUE use on obstacles as needed.   Stairs             Wheelchair Mobility    Modified Rankin (Stroke Patients Only)       Balance Overall balance assessment: Needs assistance Sitting-balance support: Feet supported;No upper extremity supported;Feet unsupported Sitting balance-Leahy Scale: Good  Standing balance support: No upper extremity supported;During functional activity Standing balance-Leahy Scale: Good Standing balance comment: improved fine motor control for RU Estabilization as needed,  unclear if fully sufficient as of yet                            Cognition Arousal/Alertness: Awake/alert Behavior During Therapy: WFL for tasks assessed/performed Overall Cognitive Status: Impaired/Different from baseline                         Following Commands: Follows one step commands with increased time;Follows multi-step commands with increased time     Problem Solving: Difficulty sequencing;Requires tactile cues;Slow processing;Decreased initiation;Requires verbal cues General Comments: follows commands and makes great efforts to integrate cuing/education; somewhat impulsive with limited attention to R hemi-body at times      Exercises Other Exercises Other Exercises: standing unsupported RUE functional task: opening and raising window blinds x2 (extensive single step verbal/tactile cues for supination/pronation; pt able to grasp and release c verbal cues only). Other Exercises: moving guest chair from center of room to up against wall with RUE only, stnading unsupported; AMB over to turn on lights with RUE unsupported. Other Exercises: standing unsupported RUE use to pick up 3 items off floor; hand hygiene x2 (sanitizer, and later soap/water) Other Exercises: assistance with AMB to BR, stnading unsupported urination and clothing management Other Exercises: Pt educated on use of bed remotes for adjustment, but sturggles with performance due to impatience    General Comments        Pertinent Vitals/Pain Pain Assessment: No/denies pain    Home Living                      Prior Function            PT Goals (current goals can now be found in the care plan section) Acute Rehab PT Goals Patient Stated Goal: Want to get better and want to go to rehab for a lot of therapy PT Goal Formulation: With patient Time For Goal Achievement: 06/04/19 Potential to Achieve Goals: Fair Progress towards PT goals: Progressing toward goals     Frequency    7X/week      PT Plan Current plan remains appropriate    Co-evaluation              AM-PAC PT "6 Clicks" Mobility   Outcome Measure  Help needed turning from your back to your side while in a flat bed without using bedrails?: None Help needed moving from lying on your back to sitting on the side of a flat bed without using bedrails?: A Little Help needed moving to and from a bed to a chair (including a wheelchair)?: A Little Help needed standing up from a chair using your arms (e.g., wheelchair or bedside chair)?: A Little Help needed to walk in hospital room?: A Little Help needed climbing 3-5 steps with a railing? : A Little 6 Click Score: 19    End of Session Equipment Utilized During Treatment: Gait belt Activity Tolerance: Patient tolerated treatment well;No increased pain Patient left: with call bell/phone within reach;with chair alarm set;in bed   PT Visit Diagnosis: Unsteadiness on feet (R26.81);Difficulty in walking, not elsewhere classified (R26.2);Apraxia (R48.2);Ataxic gait (R26.0);Other abnormalities of gait and mobility (R26.89)     Time: 6269-4854 PT Time Calculation (min) (ACUTE ONLY): 29 min  Charges:  $Neuromuscular Re-education: 23-37 mins  12:01 PM, 05/29/19 Etta Grandchild, PT, DPT Physical Therapist - [facility]  [phone] ([place])    Arnez Stoneking C 05/29/2019, 11:53 AM

## 2019-05-29 NOTE — Progress Notes (Signed)
PROGRESS NOTE    Gordon [name]  [id number] DOB: May 02, 1956 DOA: 05/19/2019 PCP: System, Pcp Not In     Assessment & Plan:   Active Problems:   ST elevation myocardial infarction (STEMI) (HCC)   Atrial fibrillation with rapid ventricular response (HCC)   HFrEF (heart failure with reduced ejection fraction) (HCC)   Acute CVA (cerebrovascular accident) ([place])   Aphasia   PAF (paroxysmal atrial fibrillation) ([place])   Acute renal failure superimposed on stage 3a chronic kidney disease (HCC)   Stage 3b chronic kidney disease  Acute cortical/subcortical infarct within the left MCA vascular territory with small petechial hemorrhage: Repeat CT scan did not show any hemorrhage. Continue on eliquis, plavix. Continue neuro checks.   STEMI: s/p balloon angioplasty to RCA. Continue w/  medical management with plavix, atorvastatin and Coreg. Continue on tele. Cardio following and recs apprec  Generalized weakness: PT is recommending SNF. OT is recommending CIR. CM aware. Pt lives alone and unlikely to do well at home alone. Pt has no insurance so d/c will likely be difficult  Hyperlipidemia: continue on statin  Chronic kidney disease stage IIIb: Cr is stable from day prio. Avoid nephrotoxic meds. Will continue to monitor  Paroxysmal atrial fibrillation: continue on coreg, eliquis. Continue on tele  Cardiomyopathy with chronic systolic congestive heart failure: No ACE or ARB with kidney injury.  No spironolactone with kidney injury. Continue on coreg, imdur, hydralazine     DVT prophylaxis: eliquis Code Status: full  Family Communication:  Disposition Plan: SNF vs CIR but pt has no insurance so d/c to either of these places will be difficult. CM is working on this    Consultants:  Cardio   Procedures: n/a   Antimicrobials: n/a   Subjective: Pt c/o fatigue  Objective: Vitals:   05/28/19 0801 05/28/19 1953 05/29/19 0343 05/29/19 0812  BP: (!) 141/89 (!) 145/87 115/71 140/89    Pulse: 73 82 68 77  Resp: 18 18 18    Temp: 98.5 F (36.9 C) 98.2 F (36.8 C) 98.6 F (37 C) 97.7 F (36.5 C)  TempSrc: Oral Oral Oral Oral  SpO2: 98% 97% 98% 98%  Weight:      Height:        Intake/Output Summary (Last 24 hours) at 05/29/2019 0834 Last data filed at 05/28/2019 1854 Gross per 24 hour  Intake 240 ml  Output 300 ml  Net -60 ml   Filed Weights   05/26/19 0547 05/27/19 0405 05/28/19 0450  Weight: 73.5 kg 75.8 kg 75.6 kg    Examination:  General exam: Appears calm and comfortable  Respiratory system: decreased breath sounds b/l. No rales  Cardiovascular system: S1 & S2 +. No  rubs, gallops or clicks. Gastrointestinal system: Abdomen is nondistended, soft and nontender.  Normal bowel sounds heard. Central nervous system: Alert and oriented. Moves all 4 extremities Psychiatry: flat mood and affect    Data Reviewed: I have personally reviewed following labs and imaging studies  CBC: Recent Labs  Lab 05/27/19 1311 05/29/19 0418  WBC 9.8 9.0  HGB 12.0* 10.8*  HCT 35.5* 34.1*  MCV 88.1 92.4  PLT 258 [id number]   Basic Metabolic Panel: Recent Labs  Lab 05/23/19 0850 05/25/19 0440 05/26/19 0530 05/27/19 0618 05/29/19 0418  NA 137 139 138 137 138  K 4.2 3.9 4.0 4.1 3.8  CL 109 110 107 106 109  CO2 20* 21* 21* 20* 20*  GLUCOSE 111* 102* 91 100* 100*  BUN 28* 29* 28* 33* 35*  CREATININE  1.94* 1.77* 1.66* 1.76* 1.77*  CALCIUM 8.5* 8.6* 8.4* 8.5* 8.7*   GFR: Estimated Creatinine Clearance: 40.8 mL/min (A) (by C-G formula based on SCr of 1.77 mg/dL (H)). Liver Function Tests: No results for input(s): AST, ALT, ALKPHOS, BILITOT, PROT, ALBUMIN in the last 168 hours. No results for input(s): LIPASE, AMYLASE in the last 168 hours. No results for input(s): AMMONIA in the last 168 hours. Coagulation Profile: No results for input(s): INR, PROTIME in the last 168 hours. Cardiac Enzymes: No results for input(s): CKTOTAL, CKMB, CKMBINDEX, TROPONINI in the last  168 hours. BNP (last 3 results) No results for input(s): PROBNP in the last 8760 hours. HbA1C: No results for input(s): HGBA1C in the last 72 hours. CBG: No results for input(s): GLUCAP in the last 168 hours. Lipid Profile: No results for input(s): CHOL, HDL, LDLCALC, TRIG, CHOLHDL, LDLDIRECT in the last 72 hours. Thyroid Function Tests: No results for input(s): TSH, T4TOTAL, FREET4, T3FREE, THYROIDAB in the last 72 hours. Anemia Panel: No results for input(s): VITAMINB12, FOLATE, FERRITIN, TIBC, IRON, RETICCTPCT in the last 72 hours. Sepsis Labs: No results for input(s): PROCALCITON, LATICACIDVEN in the last 168 hours.  Recent Results (from the past 240 hour(s))  SARS Coronavirus 2 by RT PCR (hospital order, performed in [facility] hospital lab) Nasopharyngeal Nasopharyngeal Swab     Status: None   Collection Time: 05/19/19 12:40 PM   Specimen: Nasopharyngeal Swab  Result Value Ref Range Status   SARS Coronavirus 2 NEGATIVE NEGATIVE Final    Comment: (NOTE) SARS-CoV-2 target nucleic acids are NOT DETECTED. The SARS-CoV-2 RNA is generally detectable in upper and lower respiratory specimens during the acute phase of infection. The lowest concentration of SARS-CoV-2 viral copies this assay can detect is 250 copies / mL. A negative result does not preclude SARS-CoV-2 infection and should not be used as the sole basis for treatment or other patient management decisions.  A negative result may occur with improper specimen collection / handling, submission of specimen other than nasopharyngeal swab, presence of viral mutation(s) within the areas targeted by this assay, and inadequate number of viral copies (<250 copies / mL). A negative result must be combined with clinical observations, patient history, and epidemiological information. Fact Sheet for Patients:   StrictlyIdeas.no Fact Sheet for Healthcare  Providers: BankingDealers.co.za This test is not yet approved or cleared  by the [place] FDA and has been authorized for detection and/or diagnosis of SARS-CoV-2 by FDA under an Emergency Use Authorization (EUA).  This EUA will remain in effect (meaning this test can be used) for the duration of the COVID-19 declaration under Section 564(b)(1) of the Act, 21 U.S.C. section 360bbb-3(b)(1), unless the authorization is terminated or revoked sooner. Performed at [facility], [address] [place], [place] [address]   MRSA PCR Screening     Status: None   Collection Time: 05/19/19  3:57 PM   Specimen: Nasal Mucosa; Nasopharyngeal  Result Value Ref Range Status   MRSA by PCR NEGATIVE NEGATIVE Final    Comment:        The GeneXpert MRSA Assay (FDA approved for NASAL specimens only), is one component of a comprehensive MRSA colonization surveillance program. It is not intended to diagnose MRSA infection nor to guide or monitor treatment for MRSA infections. Performed at [facility], [address], [place], [place] [address]          Radiology Studies: No results found.      Scheduled Meds: . apixaban  5 mg Oral BID  . atorvastatin  40 mg Oral q1800  . Chlorhexidine Gluconate Cloth  6 each Topical Daily  . clopidogrel  75 mg Oral Daily  . feeding supplement (ENSURE ENLIVE)  237 mL Oral BID BM  . hydrALAZINE  25 mg Oral TID  . isosorbide mononitrate  60 mg Oral Daily  . metoprolol succinate  100 mg Oral Daily  . sodium chloride flush  3 mL Intravenous Q12H   Continuous Infusions: . sodium chloride       LOS: 10 days    Time spent: 30  mins    Wyvonnia Dusky, MD [facility] Pager [phone]  If 7PM-7AM, please contact night-coverage [email or web address] Password [facility] 05/29/2019, 8:34 AM

## 2019-05-30 DIAGNOSIS — I6330 Cerebral infarction due to thrombosis of unspecified cerebral artery: Secondary | ICD-10-CM

## 2019-05-30 LAB — BASIC METABOLIC PANEL
Anion gap: 10 (ref 5–15)
BUN: 35 mg/dL — ABNORMAL HIGH (ref 8–23)
CO2: 20 mmol/L — ABNORMAL LOW (ref 22–32)
Calcium: 8.4 mg/dL — ABNORMAL LOW (ref 8.9–10.3)
Chloride: 108 mmol/L (ref 98–111)
Creatinine, Ser: 1.75 mg/dL — ABNORMAL HIGH (ref 0.61–1.24)
GFR calc Af Amer: 47 mL/min — ABNORMAL LOW (ref >60)
GFR calc non Af Amer: 40 mL/min — ABNORMAL LOW (ref >60)
Glucose, Bld: 101 mg/dL — ABNORMAL HIGH (ref 70–99)
Potassium: 3.8 mmol/L (ref 3.5–5.1)
Sodium: 138 mmol/L (ref 135–145)

## 2019-05-30 LAB — CBC
HCT: 31.7 % — ABNORMAL LOW (ref 39.0–52.0)
Hemoglobin: 10.9 g/dL — ABNORMAL LOW (ref 13.0–17.0)
MCH: 30.2 pg (ref 26.0–34.0)
MCHC: 34.4 g/dL (ref 30.0–36.0)
MCV: 87.8 fL (ref 80.0–100.0)
Platelets: 241 10*3/uL (ref 150–400)
RBC: 3.61 MIL/uL — ABNORMAL LOW (ref 4.22–5.81)
RDW: 14.6 % (ref 11.5–15.5)
WBC: 9.8 10*3/uL (ref 4.0–10.5)
nRBC: 0 % (ref 0.0–0.2)

## 2019-05-30 MED ORDER — AMIODARONE HCL 200 MG PO TABS
400.0000 mg | ORAL_TABLET | Freq: Two times a day (BID) | ORAL | Status: DC
  Administered 2019-05-30 – 2019-06-03 (×9): 400 mg via ORAL
  Filled 2019-05-30 (×9): qty 2

## 2019-05-30 MED ORDER — AMIODARONE IV BOLUS ONLY 150 MG/100ML
150.0000 mg | Freq: Once | INTRAVENOUS | Status: AC
  Administered 2019-05-30: 11:00:00 150 mg via INTRAVENOUS
  Filled 2019-05-30: qty 100

## 2019-05-30 MED ORDER — SODIUM BICARBONATE 650 MG PO TABS
650.0000 mg | ORAL_TABLET | Freq: Every day | ORAL | Status: DC
  Administered 2019-05-30: 10:00:00 650 mg via ORAL
  Filled 2019-05-30: qty 1

## 2019-05-30 NOTE — Progress Notes (Signed)
PT Cancellation Note  Patient Details Name: Renso Micheau MRN: [id number] DOB: Nov 17, 1955   Cancelled Treatment:    Reason Eval/Treat Not Completed: Medical issues which prohibited therapy(Chart reviewed, treatment attempted. Pt in AF c RVR rates in 130s. RN/cards made aware. Attempted again later in day, rates similar. Will resume treatment once HR is in safer range.)  4:49 PM, 05/30/19 Etta Grandchild, PT, DPT Physical Therapist - [facility]  [phone] ([place])    Johnita Palleschi C 05/30/2019, 4:48 PM

## 2019-05-30 NOTE — Progress Notes (Signed)
Occupational Therapy Treatment Patient Details Name: Gordon [name] MRN: [id number] DOB: [birth year] Today's Date: 05/30/2019    History of present illness Gordon [name] is a 79yoM who comes to [facility] 12/7 c CP and gen weakness, notable HTN and AFcRVR, pt admitted c STEMI, HeadCT negaitve. Pt underwent a cardiac catheterization on 12/7 status post unsuccessful balloon angioplasty of the RCA due to diffuse disease, calcification and thrombus. Pt having expressive difficulty and motor control impairment of the right hemibody. Head CT/MRI showing acute CVA. PTA pt was fully independent, retired but looking ino to working part time in the future.   OT comments  Pt seen for OT tx this date. Pt very eager to participate so he can improve and return to PLOF. With CGA for functioanl transfer from EOB and to take 2 steps forward, pt stood at the sink with LUE support on counter top and CGA. With verbal cues for sequencing, pt was instructed to perform grooming tasks using R hand. Pt was able to open the tube and apply toothpaste squeezing it with his R hand onto the toothbrush for adequate amount. Min cues and assist to reposition the toothbrush in his R hand to moderate pressure applied. Pt expressed mild frustration at times with performance but with encouragement was easily able to redirect to task and continue. Pt instructed in [facility] tasks involving crossing midline, various grasps, constraining LUE to encourage RUE use, and intermittent cues for visually attending to R side to improve performance. Pt very pleased with his progress during session. Stated, "I wish I could do therapy all day!" Pt progressing, will continue to work towards goals. Noted CIR declined. POC updated.    Follow Up Recommendations  SNF    Equipment Recommendations  3 in 1 bedside commode    Recommendations for Other Services      Precautions / Restrictions Precautions Precautions: Fall Restrictions Weight Bearing Restrictions: No        Mobility Bed Mobility Overal bed mobility: Modified Independent Bed Mobility: Supine to Sit;Sit to Supine              Transfers Overall transfer level: Needs assistance Equipment used: None Transfers: Sit to/from Stand Sit to Stand: Min guard              Balance Overall balance assessment: Needs assistance Sitting-balance support: Feet supported;No upper extremity supported;Feet unsupported Sitting balance-Leahy Scale: Good     Standing balance support: No upper extremity supported;During functional activity;Single extremity supported Standing balance-Leahy Scale: Good                             ADL either performed or assessed with clinical judgement   ADL Overall ADL's : Needs assistance/impaired     Grooming: Wash/dry face;Oral care;Applying deodorant;Brushing hair;Min guard;Set up;Standing Grooming Details (indicate cue type and reason): Pt stood at sink with LUE support on counter top and CGA and with verbal cues for sequencing instructed to perform grooming tasks using R hand. Pt was able to open the tube and apply toothpaste squeezing it with his R hand onto the toothbrush for adequate amount. Min cues and assist to reposition the toothbrush in his R hand to moderate pressure applied.                                     Vision       Perception  Praxis      Cognition Arousal/Alertness: Awake/alert Behavior During Therapy: WFL for tasks assessed/performed Overall Cognitive Status: Impaired/Different from baseline                         Following Commands: Follows one step commands consistently;Follows multi-step commands with increased time Safety/Judgement: Decreased awareness of safety Awareness: Emergent Problem Solving: Difficulty sequencing;Requires tactile cues;Slow processing;Decreased initiation;Requires verbal cues          Exercises Other Exercises Other Exercises: Pt instructed in  reaching across midline to retrieve small grooming items from bag with R hand with L hand constrained and then open various containers (twist caps, pop caps) using L hand to stabilize item and R hand to twist/pull Other Exercises: Pt instructed in [facility] involving pincer grasp, finger isolation, sustained palmar grasp with pincer grasp for picking up objects and reaching across midline to place into trash bin. Pt demo'd progress during activity initially requiring visual and tactile cues to maintain 3rd-5th digits in flexion to utilize pincer grasp vs gross grasp of items, improving to verbal cues   Shoulder Instructions       General Comments      Pertinent Vitals/ Pain       Pain Assessment: No/denies pain  Home Living                                          Prior Functioning/Environment              Frequency  Min 2X/week        Progress Toward Goals  OT Goals(current goals can now be found in the care plan section)  Progress towards OT goals: Progressing toward goals  Acute Rehab OT Goals Patient Stated Goal: Want to get better and want to go to rehab for a lot of therapy OT Goal Formulation: With patient Time For Goal Achievement: 06/05/19 Potential to Achieve Goals: Good  Plan Discharge plan needs to be updated;Frequency needs to be updated    Co-evaluation                 AM-PAC OT "6 Clicks" Daily Activity     Outcome Measure   Help from another person eating meals?: A Little Help from another person taking care of personal grooming?: A Little Help from another person toileting, which includes using toliet, bedpan, or urinal?: A Little Help from another person bathing (including washing, rinsing, drying)?: A Lot Help from another person to put on and taking off regular upper body clothing?: A Little Help from another person to put on and taking off regular lower body clothing?: A Lot 6 Click Score: 16    End of Session Equipment  Utilized During Treatment: Gait belt  OT Visit Diagnosis: Other abnormalities of gait and mobility (R26.89);Hemiplegia and hemiparesis;Apraxia (R48.2);Feeding difficulties (R63.3) Hemiplegia - Right/Left: Right Hemiplegia - dominant/non-dominant: Dominant Hemiplegia - caused by: Cerebral infarction   Activity Tolerance Patient tolerated treatment well   Patient Left in bed;with bed alarm set;with call bell/phone within reach   Nurse Communication          Time: [id number] OT Time Calculation (min): 38 min  Charges: OT General Charges $OT Visit: 1 Visit OT Treatments $Self Care/Home Management : 8-22 mins $Neuromuscular Re-education: 23-37 mins  Jeni Salles, MPH, MS, OTR/L ascom [phone] 05/30/19, 5:10 PM

## 2019-05-30 NOTE — Progress Notes (Signed)
  Speech Language Pathology Treatment: Cognitive-Linquistic  Patient Details Name: Gordon [name] MRN: [id number] DOB: 10-05-1955 Today's Date: 05/30/2019 Time: 1000-1100 SLP Time Calculation (min) (ACUTE ONLY): 60 min  Assessment / Plan / Recommendation Clinical Impression  The patient presents with moderate expressive aphasia/apraxia of speech.  He is improving in spontaneous speech as well as specific expressive tasks (confrontation naming, wh-questions, reading fluency, delayed imitation).  He is responsive to semantic and phonemic cues.  Initial responses are often recognizable phonemic paraphasia.  The patient is eager to improve his communication and will benefit from ongoing intensive speech therapy.   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

## 2019-05-30 NOTE — Plan of Care (Signed)
  Problem: Clinical Measurements: Goal: Diagnostic test results will improve Outcome: Progressing Goal: Cardiovascular complication will be avoided Outcome: Progressing   Problem: Coping: Goal: Level of anxiety will decrease Outcome: Progressing   Problem: Safety: Goal: Ability to remain free from injury will improve Outcome: Progressing   

## 2019-05-30 NOTE — TOC Progression Note (Addendum)
Transition of Care [facility]) - Progression Note    Patient Details  Name: Gordon [name] MRN: [id number] Date of Birth: July 19, 1955  [facility] CM/SW Contact  Ross Ludwig, [place] Phone Number: 05/30/2019, 6:13 PM  Clinical Narrative:     CSW spoke to [place] at [facility], and they have agreed to accept patient next week on Monday or Tuesday if patient is medically ready for discharge.  However SNF said that Eliquis is an expensive medication, and they will not be able to get it from their pharmacy.  CSW contacted [facility] leadership to see if there is a way the hospital can provide the medication for patient for discharge.  CSW will contact cardiologist to see if there is an alternative medication.  Patient will need a new Covid test 48-72 hours before discharge.  CSW to ask weekend CSW to see if they can speak to physician to order new Covid test.  CSW continuing to follow patient's progress throughout discharge planning.  CSW updated patient's niece Coutney [phone].     Expected Discharge Plan and Services  Patient to discharge to SNF                                               Social Determinants of Health (SDOH) Interventions    Readmission Risk Interventions No flowsheet data found.

## 2019-05-30 NOTE — Progress Notes (Signed)
Progress Note  Patient Name: Gordon [name] Date of Encounter: 05/30/2019  Primary Cardiologist: Fletcher Anon  Subjective   No new complaints, working with speech therapy this morning Telemetry reviewed, paroxysmal atrial fibrillation noted Frequent episodes of PAF past 24 hours Rate up to 130s, He is asymptomatic  Inpatient Medications    Scheduled Meds: . apixaban  5 mg Oral BID  . atorvastatin  40 mg Oral q1800  . Chlorhexidine Gluconate Cloth  6 each Topical Daily  . clopidogrel  75 mg Oral Daily  . feeding supplement (ENSURE ENLIVE)  237 mL Oral BID BM  . hydrALAZINE  25 mg Oral TID  . isosorbide mononitrate  60 mg Oral Daily  . metoprolol succinate  100 mg Oral Daily  . sodium bicarbonate  650 mg Oral Daily  . sodium chloride flush  3 mL Intravenous Q12H   Continuous Infusions: . sodium chloride     PRN Meds: sodium chloride, acetaminophen, metoprolol tartrate, ondansetron (ZOFRAN) IV, sodium chloride flush   Vital Signs    Vitals:   05/29/19 1552 05/29/19 1946 05/30/19 0348 05/30/19 0748  BP: 123/83 131/81 132/84 125/89  Pulse: 72 79 70 (!) 107  Resp: 18 20 18 19   Temp: 99 F (37.2 C) 98.7 F (37.1 C) 98.8 F (37.1 C) 98.4 F (36.9 C)  TempSrc: Oral Oral Oral Oral  SpO2: 99% 98% 97% 97%  Weight:      Height:        Intake/Output Summary (Last 24 hours) at 05/30/2019 1010 Last data filed at 05/30/2019 0901 Gross per 24 hour  Intake 0 ml  Output 600 ml  Net -600 ml   Filed Weights   05/26/19 0547 05/27/19 0405 05/28/19 0450  Weight: 73.5 kg 75.8 kg 75.6 kg    Telemetry    Atrial fibrillation rate 130 bpm- Personally Reviewed  ECG    No new tracings - Personally Reviewed  Physical Exam   Constitutional:  oriented to person, place, and time. No distress.  HENT:  Head: Grossly normal Eyes:  no discharge. No scleral icterus.  Neck: No JVD, no carotid bruits  Cardiovascular: Irregularly irregular, rapid no murmurs appreciated  Pulmonary/Chest: Clear to auscultation bilaterally, no wheezes or rails Abdominal: Soft.  no distension.  no tenderness.  Musculoskeletal: Normal range of motion Neurological:  normal muscle tone. Coordination normal. No atrophy Skin: Skin warm and dry Psychiatric: normal affect, pleasant   Labs    Chemistry Recent Labs  Lab 05/27/19 0618 05/29/19 0418 05/30/19 0506  NA 137 138 138  K 4.1 3.8 3.8  CL 106 109 108  CO2 20* 20* 20*  GLUCOSE 100* 100* 101*  BUN 33* 35* 35*  CREATININE 1.76* 1.77* 1.75*  CALCIUM 8.5* 8.7* 8.4*  GFRNONAA 40* 40* 40*  GFRAA 46* 46* 47*  ANIONGAP 11 9 10      Hematology Recent Labs  Lab 05/27/19 1311 05/29/19 0418 05/30/19 0506  WBC 9.8 9.0 9.8  RBC 4.03* 3.69* 3.61*  HGB 12.0* 10.8* 10.9*  HCT 35.5* 34.1* 31.7*  MCV 88.1 92.4 87.8  MCH 29.8 29.3 30.2  MCHC 33.8 31.7 34.4  RDW 14.6 14.5 14.6  PLT 258 245 241    Cardiac EnzymesNo results for input(s): TROPONINI in the last 168 hours. No results for input(s): TROPIPOC in the last 168 hours.   BNPNo results for input(s): BNP, PROBNP in the last 168 hours.   DDimer No results for input(s): DDIMER in the last 168 hours.   Radiology  No results found.  Cardiac Studies    LHC 05/19/2019: There is moderate to severe left ventricular systolic dysfunction.  LV end diastolic pressure is mildly elevated.  Ost LAD to Prox LAD lesion is 30% stenosed.  Prox LAD to Mid LAD lesion is 30% stenosed.  Mid LAD to Dist LAD lesion is 60% stenosed.  Prox RCA to Mid RCA lesion is 100% stenosed.  RPDA lesion is 90% stenosed.  Post intervention, there is a 80% residual stenosis.  Balloon angioplasty was performed using a BALLOON TREK RX [id number].   1.  Codominant coronary arteries with severe one-vessel coronary artery disease.  The culprit is an occluded proximal right coronary artery which is diffusely diseased and heavily calcified.  Left to right collaterals were noted. 2.  Moderately  to severely reduced LV systolic function with an EF of 30 to 35% with akinesis of the inferior wall. 3.  Mildly elevated left ventricular end-diastolic pressure at 21 mmHg. 4.  Unsuccessful balloon angioplasty of the right coronary artery given diffuse disease, calcifications and thrombus.  This was in spite of multiple balloon inflations throughout the whole vessel.  Recommendations: Based on the patient's history and angiographic findings, I suspect that this is a late presentation inferior ST elevation myocardial infarction or acute on chronic presentation.  Given late presentation, underlying renal failure of unknown chronicity and the fact that the patient was chest pain-free, I elected not to prolong the procedure anymore and accepted results. Recommend medical therapy.  I was also concerned about the patient's continuous right arm movement in spite of instructed him not to do so. __________  2D Echo 05/20/2019: 1. Left ventricular ejection fraction, by visual estimation, is 30 to 35%. The left ventricle has moderate to severely decreased function. There is no left ventricular hypertrophy.  2. Left ventricular diastolic parameters are consistent with Grade II diastolic dysfunction (pseudonormalization).  3. There is severe hypokinesis of the inferior and inferoseptal walls.  4. There is septal "bounce" suggestive of increased intrapericardial pressure or constriction.  5. Global right ventricle has moderately reduced systolic function.The right ventricular size is mildly enlarged. No increase in right ventricular wall thickness.  6. Left atrial size was normal.  7. Right atrial size was mildly dilated.  8. Small pericardial effusion.  9. Mild to moderate mitral annular calcification. 10. The mitral valve is degenerative. Mild mitral valve regurgitation. No evidence of mitral stenosis. 11. The tricuspid valve is not well visualized. Tricuspid valve regurgitation mild-moderate. 12. The  aortic valve is tricuspid. Aortic valve regurgitation is not visualized. Mild to moderate aortic valve sclerosis/calcification without any evidence of aortic stenosis. 13. The pulmonic valve was not well visualized. Pulmonic valve regurgitation is not visualized. 14. Mildly dilated pulmonary artery. 15. Mildly elevated pulmonary artery systolic pressure. 16. The inferior vena cava is dilated in size with <50% respiratory variability, suggesting right atrial pressure of 15 mmHg.   Patient Profile     63 y.o. male with history of diabetes, HTN, and tobacco use admitted with late presenting inferior MI complicated by left MCA territory infarct.   Assessment & Plan    1. Late presenting inferior MI: -Status post unsuccessful PTCA of the RCA given diffuse disease, calcifications, and thrombus with medical management advised in the setting of acute on chronic late presentation, patient was chest pain free, and underlying renal failure of unknown chronicity, as well as noncompliance with right arm moving during the case -Continue Plavix with Eliquis --Plan for cardiac rehab  2. HFrEF  secondary to ICM:  Lipitor, isosorbide, metoprolol succinate, hydralazine Appears euvolemic  3. PAF:  PAF overnight several episodes, rate 130 this morning -Continue Toprol XL -Eliquis 5 mg bid (he does not meet reduced dosing criteria) -CHADS2VASc at least 6 (CHF, HTN, DM, stroke x 2, vascular disease) --We will add amiodarone 150 mg bolus x1, start amiodarone 400 twice daily oral dosing for 5 days then down to 200 twice daily  4. CVA: -Plavix -Liptior Working with speech therapy this morning May need rehab placement Working with PT  5. Anemia: -Monitor as he is on Eliquis and Plavix Hemoglobin stable  6. CKS stage II-III: -Stable Creatinine 1.7  7. HTN:  hydralazineToprol XL and Imdur Blood pressure 1 [id number] systolic  8. HLD: -Continue high dose Lipitor Goal LDL less than 70  Case  discussed with nursing, physical therapy, new medications placed for rapid atrial fibrillation, discussed with patient  Total encounter time more than 35 minutes  Greater than 50% was spent in counseling and coordination of care with the patient    For questions or updates, please contact [place] Please consult [email or web address] for contact info under Cardiology/STEMI.   Signed, Esmond Plants, MD, Ph.D [facility]

## 2019-05-30 NOTE — Progress Notes (Deleted)
    No new recommendations. Please see note from 05/29/2019.

## 2019-05-30 NOTE — Progress Notes (Signed)
PROGRESS NOTE    Gordon [name]  [id number] DOB: [birth year] DOA: 05/19/2019 PCP: System, Pcp Not In     Assessment & Plan:   Active Problems:   ST elevation myocardial infarction (STEMI) (HCC)   Atrial fibrillation with rapid ventricular response (HCC)   HFrEF (heart failure with reduced ejection fraction) (HCC)   Acute CVA (cerebrovascular accident) ([place])   Aphasia   PAF (paroxysmal atrial fibrillation) ([place])   Acute renal failure superimposed on stage 3a chronic kidney disease (HCC)   Stage 3b chronic kidney disease   Cerebrovascular accident (CVA) due to thrombosis of cerebral artery ([place])  Acute cortical/subcortical infarct within the left MCA vascular territory with small petechial hemorrhage: Repeat CT scan did not show any hemorrhage. Continue on eliquis, plavix. Continue neuro checks.   STEMI: s/p balloon angioplasty to RCA. Continue w/ medical management with plavix, atorvastatin and Coreg. Continue on tele. Cardio following and recs apprec  Paroxysmal atrial fibrillation: continue on coreg, eliquis. Will start amiodarone 150 mg bolus x1, then start amiodarone 400 twice daily x 5 days then down to 200 twice daily as per cardio. Continue on tele  Generalized weakness: PT is recommending SNF. OT is recommending CIR. CM aware. Pt lives alone and unlikely to do well at home alone. Pt has no insurance so d/c will likely be difficult  Hyperlipidemia: continue on statin  Chronic kidney disease stage IIIb: Cr is stable from day prio. Avoid nephrotoxic meds. Will continue to monitor  Metabolic acidosis: will start bicarb  Cardiomyopathy with chronic systolic congestive heart failure: No ACE or ARB with kidney injury.  No spironolactone with kidney injury. Continue on coreg, imdur, hydralazine     DVT prophylaxis: eliquis Code Status: full  Family Communication:  Disposition Plan: SNF vs CIR but pt has no insurance so d/c to either of these places will be difficult. CM is  working on this    Consultants:  Cardio   Procedures: n/a   Antimicrobials: n/a   Subjective: Pt c/o malaise  Objective: Vitals:   05/29/19 1552 05/29/19 1946 05/30/19 0348 05/30/19 0748  BP: 123/83 131/81 132/84 125/89  Pulse: 72 79 70 (!) 107  Resp: 18 20 18 19   Temp: 99 F (37.2 C) 98.7 F (37.1 C) 98.8 F (37.1 C) 98.4 F (36.9 C)  TempSrc: Oral Oral Oral Oral  SpO2: 99% 98% 97% 97%  Weight:      Height:        Intake/Output Summary (Last 24 hours) at 05/30/2019 0843 Last data filed at 05/30/2019 0703 Gross per 24 hour  Intake 3 ml  Output 550 ml  Net -547 ml   Filed Weights   05/26/19 0547 05/27/19 0405 05/28/19 0450  Weight: 73.5 kg 75.8 kg 75.6 kg    Examination:  General exam: Appears calm and comfortable  Respiratory system: diminished breath sounds b/l. No wheezes Cardiovascular system: S1 & S2 +. No  rubs, gallops or clicks. Gastrointestinal system: Abdomen is nondistended, soft and nontender.  Normal bowel sounds heard. Central nervous system: Alert and oriented. Moves all 4 extremities Psychiatry: flat mood and affect    Data Reviewed: I have personally reviewed following labs and imaging studies  CBC: Recent Labs  Lab 05/27/19 1311 05/29/19 0418 05/30/19 0506  WBC 9.8 9.0 9.8  HGB 12.0* 10.8* 10.9*  HCT 35.5* 34.1* 31.7*  MCV 88.1 92.4 87.8  PLT 258 245 [id number]   Basic Metabolic Panel: Recent Labs  Lab 05/25/19 0440 05/26/19 0530 05/27/19 0618 05/29/19 0418  05/30/19 0506  NA 139 138 137 138 138  K 3.9 4.0 4.1 3.8 3.8  CL 110 107 106 109 108  CO2 21* 21* 20* 20* 20*  GLUCOSE 102* 91 100* 100* 101*  BUN 29* 28* 33* 35* 35*  CREATININE 1.77* 1.66* 1.76* 1.77* 1.75*  CALCIUM 8.6* 8.4* 8.5* 8.7* 8.4*   GFR: Estimated Creatinine Clearance: 41.3 mL/min (A) (by C-G formula based on SCr of 1.75 mg/dL (H)). Liver Function Tests: No results for input(s): AST, ALT, ALKPHOS, BILITOT, PROT, ALBUMIN in the last 168 hours. No  results for input(s): LIPASE, AMYLASE in the last 168 hours. No results for input(s): AMMONIA in the last 168 hours. Coagulation Profile: No results for input(s): INR, PROTIME in the last 168 hours. Cardiac Enzymes: No results for input(s): CKTOTAL, CKMB, CKMBINDEX, TROPONINI in the last 168 hours. BNP (last 3 results) No results for input(s): PROBNP in the last 8760 hours. HbA1C: No results for input(s): HGBA1C in the last 72 hours. CBG: No results for input(s): GLUCAP in the last 168 hours. Lipid Profile: No results for input(s): CHOL, HDL, LDLCALC, TRIG, CHOLHDL, LDLDIRECT in the last 72 hours. Thyroid Function Tests: No results for input(s): TSH, T4TOTAL, FREET4, T3FREE, THYROIDAB in the last 72 hours. Anemia Panel: No results for input(s): VITAMINB12, FOLATE, FERRITIN, TIBC, IRON, RETICCTPCT in the last 72 hours. Sepsis Labs: No results for input(s): PROCALCITON, LATICACIDVEN in the last 168 hours.  No results found for this or any previous visit (from the past 240 hour(s)).       Radiology Studies: No results found.      Scheduled Meds: . apixaban  5 mg Oral BID  . atorvastatin  40 mg Oral q1800  . Chlorhexidine Gluconate Cloth  6 each Topical Daily  . clopidogrel  75 mg Oral Daily  . feeding supplement (ENSURE ENLIVE)  237 mL Oral BID BM  . hydrALAZINE  25 mg Oral TID  . isosorbide mononitrate  60 mg Oral Daily  . metoprolol succinate  100 mg Oral Daily  . sodium chloride flush  3 mL Intravenous Q12H   Continuous Infusions: . sodium chloride       LOS: 11 days    Time spent: 30  mins    Wyvonnia Dusky, MD [facility] Pager [phone]  If 7PM-7AM, please contact night-coverage [email or web address] Password [facility] 05/30/2019, 8:43 AM

## 2019-05-31 LAB — CBC
HCT: 33 % — ABNORMAL LOW (ref 39.0–52.0)
Hemoglobin: 10.9 g/dL — ABNORMAL LOW (ref 13.0–17.0)
MCH: 29.3 pg (ref 26.0–34.0)
MCHC: 33 g/dL (ref 30.0–36.0)
MCV: 88.7 fL (ref 80.0–100.0)
Platelets: 219 10*3/uL (ref 150–400)
RBC: 3.72 MIL/uL — ABNORMAL LOW (ref 4.22–5.81)
RDW: 14.6 % (ref 11.5–15.5)
WBC: 8.4 10*3/uL (ref 4.0–10.5)
nRBC: 0 % (ref 0.0–0.2)

## 2019-05-31 LAB — BASIC METABOLIC PANEL
Anion gap: 11 (ref 5–15)
BUN: 39 mg/dL — ABNORMAL HIGH (ref 8–23)
CO2: 22 mmol/L (ref 22–32)
Calcium: 8.5 mg/dL — ABNORMAL LOW (ref 8.9–10.3)
Chloride: 105 mmol/L (ref 98–111)
Creatinine, Ser: 1.94 mg/dL — ABNORMAL HIGH (ref 0.61–1.24)
GFR calc Af Amer: 41 mL/min — ABNORMAL LOW (ref >60)
GFR calc non Af Amer: 36 mL/min — ABNORMAL LOW (ref >60)
Glucose, Bld: 110 mg/dL — ABNORMAL HIGH (ref 70–99)
Potassium: 4.1 mmol/L (ref 3.5–5.1)
Sodium: 138 mmol/L (ref 135–145)

## 2019-05-31 NOTE — Progress Notes (Signed)
Progress Note  Patient Name: Gordon [name] Date of Encounter: 05/31/2019  Primary Cardiologist: Kathlyn Sacramento, MD   Subjective   Feeling well.  Eager to get more PT and recover.  Inpatient Medications    Scheduled Meds: . amiodarone  400 mg Oral BID  . apixaban  5 mg Oral BID  . atorvastatin  40 mg Oral q1800  . Chlorhexidine Gluconate Cloth  6 each Topical Daily  . clopidogrel  75 mg Oral Daily  . feeding supplement (ENSURE ENLIVE)  237 mL Oral BID BM  . hydrALAZINE  25 mg Oral TID  . isosorbide mononitrate  60 mg Oral Daily  . metoprolol succinate  100 mg Oral Daily  . sodium chloride flush  3 mL Intravenous Q12H   Continuous Infusions: . sodium chloride     PRN Meds: sodium chloride, acetaminophen, metoprolol tartrate, ondansetron (ZOFRAN) IV, sodium chloride flush   Vital Signs    Vitals:   05/31/19 0130 05/31/19 0547 05/31/19 0805 05/31/19 0932  BP:  135/82 (!) 140/95   Pulse:  (!) 59 (!) 112 98  Resp:  18 20   Temp:  98.6 F (37 C) 98.8 F (37.1 C)   TempSrc:  Oral Oral   SpO2:  96% 97%   Weight: 73.2 kg     Height:        Intake/Output Summary (Last 24 hours) at 05/31/2019 0958 Last data filed at 05/31/2019 0001 Gross per 24 hour  Intake 86.81 ml  Output 200 ml  Net -113.19 ml   Last 3 Weights 05/31/2019 05/28/2019 05/27/2019  Weight (lbs) 161 lb 4.8 oz 166 lb 9.6 oz 167 lb  Weight (kg) 73.165 kg 75.569 kg 75.751 kg      Telemetry    Atrial fibrillation in the 100s, sinus rhythm in the 60s.   - Personally Reviewed  ECG    N/a  - Personally Reviewed  Physical Exam   VS:  BP (!) 140/95 (BP Location: Right Arm)   Pulse 98   Temp 98.8 F (37.1 C) (Oral)   Resp 20   Ht 5\' 8"  (1.727 m)   Wt 73.2 kg   SpO2 97%   BMI 24.53 kg/m  , BMI Body mass index is 24.53 kg/m. GENERAL:  Well appearing.  No acute distres HEENT: Pupils equal round and reactive, fundi not visualized, oral mucosa unremarkable NECK:  No jugular venous distention,  waveform within normal limits, carotid upstroke brisk and symmetric, no bruits LUNGS:  Clear to auscultation bilaterally HEART:  RRR.  PMI not displaced or sustained,S1 and S2 within normal limits, no S3, no S4, no clicks, no rubs, no murmurs ABD:  Flat, positive bowel sounds normal in frequency in pitch, no bruits, no rebound, no guarding, no midline pulsatile mass, no hepatomegaly, no splenomegaly EXT:  2 plus pulses throughout, no edema, no cyanosis no clubbing SKIN:  No rashes no nodules NEURO:    Some word finding difficulty and expressive aphasia PSYCH:  Cognitively intact, oriented to person place and time   Labs    High Sensitivity Troponin:   Recent Labs  Lab 05/19/19 1026 05/19/19 1446 05/19/19 1720  TROPONINIHS 3,929* 3,682* 3,729*      Chemistry Recent Labs  Lab 05/29/19 0418 05/30/19 0506 05/31/19 0642  NA 138 138 138  K 3.8 3.8 4.1  CL 109 108 105  CO2 20* 20* 22  GLUCOSE 100* 101* 110*  BUN 35* 35* 39*  CREATININE 1.77* 1.75* 1.94*  CALCIUM 8.7* 8.4*  8.5*  GFRNONAA 40* 40* 36*  GFRAA 46* 47* 41*  ANIONGAP 9 10 11      Hematology Recent Labs  Lab 05/29/19 0418 05/30/19 0506 05/31/19 0642  WBC 9.0 9.8 8.4  RBC 3.69* 3.61* 3.72*  HGB 10.8* 10.9* 10.9*  HCT 34.1* 31.7* 33.0*  MCV 92.4 87.8 88.7  MCH 29.3 30.2 29.3  MCHC 31.7 34.4 33.0  RDW 14.5 14.6 14.6  PLT 245 241 219    BNPNo results for input(s): BNP, PROBNP in the last 168 hours.   DDimer No results for input(s): DDIMER in the last 168 hours.   Radiology    No results found.  Cardiac Studies   LHC 05/19/2019: There is moderate to severe left ventricular systolic dysfunction.  LV end diastolic pressure is mildly elevated.  Ost LAD to Prox LAD lesion is 30% stenosed.  Prox LAD to Mid LAD lesion is 30% stenosed.  Mid LAD to Dist LAD lesion is 60% stenosed.  Prox RCA to Mid RCA lesion is 100% stenosed.  RPDA lesion is 90% stenosed.  Post intervention, there is a 80% residual  stenosis.  Balloon angioplasty was performed using a BALLOON TREK RX 2.5X20.  1. Codominant coronary arteries with severe one-vessel coronary artery disease. The culprit is an occluded proximal right coronary artery which is diffusely diseased and heavily calcified. Left to right collaterals were noted. 2. Moderately to severely reduced LV systolic function with an EF of 30 to 35% with akinesis of the inferior wall. 3. Mildly elevated left ventricular end-diastolic pressure at 21 mmHg. 4. Unsuccessful balloon angioplasty of the right coronary artery given diffuse disease, calcifications and thrombus. This was in spite of multiple balloon inflations throughout the whole vessel.  Recommendations: Based on the patient's history and angiographic findings, I suspect that this is a late presentation inferior ST elevation myocardial infarction or acute on chronic presentation. Given late presentation, underlying renal failure of unknown chronicity and the fact that the patient was chest pain-free, I elected not to prolong the procedure anymore and accepted results. Recommend medical therapy. I was also concerned about the patient's continuous right arm movement in spite of instructed him not to do so. __________  2D Echo 05/20/2019: 1. Left ventricular ejection fraction, by visual estimation, is 30 to 35%. The left ventricle has moderate to severely decreased function. There is no left ventricular hypertrophy. 2. Left ventricular diastolic parameters are consistent with Grade II diastolic dysfunction (pseudonormalization). 3. There is severe hypokinesis of the inferior and inferoseptal walls. 4. There is septal "bounce" suggestive of increased intrapericardial pressure or constriction. 5. Global right ventricle has moderately reduced systolic function.The right ventricular size is mildly enlarged. No increase in right ventricular wall thickness. 6. Left atrial size was normal. 7. Right  atrial size was mildly dilated. 8. Small pericardial effusion. 9. Mild to moderate mitral annular calcification. 10. The mitral valve is degenerative. Mild mitral valve regurgitation. No evidence of mitral stenosis. 11. The tricuspid valve is not well visualized. Tricuspid valve regurgitation mild-moderate. 12. The aortic valve is tricuspid. Aortic valve regurgitation is not visualized. Mild to moderate aortic valve sclerosis/calcification without any evidence of aortic stenosis. 13. The pulmonic valve was not well visualized. Pulmonic valve regurgitation is not visualized. 14. Mildly dilated pulmonary artery. 15. Mildly elevated pulmonary artery systolic pressure. 16. The inferior vena cava is dilated in size with <50% respiratory variability, suggesting right atrial pressure of 15 mmHg.   Patient Profile     63 y.o. male with hypertension, tobacco  abuse, and diabetes admitted with late presenting inferior MI complicated by left MCA stroke.  Assessment & Plan    # Late presenting inferior MI: # Hyperlipidemia: Currently denies chest pain.  Continue clopidogrel, metoprolol and Imdur.  # Acute systolic and diastolic heart failure: LVEF 30 to 35% with severe hypokinesis of the inferior and inferior septal walls.  Right atrial pressure was 15 on the echo 05/20/2019.  He is euvolemic on exam.  No ARB/ARNI 2/2 acute on chronic renal failure.  Creatinine today 1.94 up from 1.75 yesterday.   # PAF: Patient's SNF is unable to afford Eliquis.  Ideally he would be on Eliquis, Xarelto, or Pradaxa.  If this is not an option then warfarin is acceptable.  If there are plans to switch, would start tonight to allow INR to start increasing.  He would need bridging with lovenox if discharged before INR is 2-3.  # Acute stroke: Expressive aphasia and right hemiparesis.  Continue Plavix and atorvastatin.  # Hypertension: BP mostly controlled on current regimen.  [facility] will sign off.     Medication Recommendations:  Ok to switch to warfarin if needed 2/2 cost Other recommendations (labs, testing, etc):  none Follow up as an outpatient:  We will arrange      For questions or updates, please contact [place] Please consult [email or web address] for contact info under        Signed, Skeet Latch, MD  05/31/2019, 9:58 AM

## 2019-05-31 NOTE — Progress Notes (Signed)
Physical Therapy Treatment Patient Details Name: Gordon [name] MRN: [id number] DOB: October 05, 1955 Today's Date: 05/31/2019    History of Present Illness Gordon [name] is a 89yoM who comes to [facility] 12/7 c CP and gen weakness, notable HTN and AFcRVR, pt admitted c STEMI, HeadCT negaitve. Pt underwent a cardiac catheterization on 12/7 status post unsuccessful balloon angioplasty of the RCA due to diffuse disease, calcification and thrombus. Pt having expressive difficulty and motor control impairment of the right hemibody. Head CT/MRI showing acute CVA. PTA pt was fully independent, retired but looking ino to working part time in the future.    PT Comments    Pt excited for session.  Bed mobility without assist.  Upon standing he reaches for RW.  While yesterday he was able to complete session without AD, he voiced hesitance and fear and preferred to use it.  He is able to walk 6 laps around unit with min guard and occasional min a due to kicking walker with R foot.  He does externally rotate which increases the likely hood of him kicking it and increasing fall risk.  Balance activities in hallway for sidestepping, forward and backwards gait with emphasis on control and balance with handrail nearby for security.  Short trial of gait without AD but he took a few steps with arms splayed to the side and a very wide BOS while stating he was not comfortable with it today.  RW returned.  Pt continues to work hard in therapy sessions.  Voiced frustration over aphasia.   Follow Up Recommendations  SNF;Supervision for mobility/OOB;Supervision/Assistance - 24 hour     Equipment Recommendations  Rolling walker with 5" wheels    Recommendations for Other Services       Precautions / Restrictions Precautions Precautions: Fall Restrictions Weight Bearing Restrictions: No    Mobility  Bed Mobility Overal bed mobility: Modified Independent       Supine to sit: Modified independent (Device/Increase  time) Sit to supine: Modified independent (Device/Increase time)      Transfers Overall transfer level: Needs assistance Equipment used: Rolling walker (2 wheeled) Transfers: Sit to/from Stand Sit to Stand: Min guard            Ambulation/Gait Ambulation/Gait assistance: Min guard Gait Distance (Feet): 1000 Feet Assistive device: Rolling walker (2 wheeled) Gait Pattern/deviations: Step-through pattern     General Gait Details: pt voiced feeling uncomfortable today without RW.  Often kicking R side of walker with R LE which is externally rotated at times.   Stairs             Wheelchair Mobility    Modified Rankin (Stroke Patients Only)       Balance Overall balance assessment: Needs assistance Sitting-balance support: Feet supported;No upper extremity supported;Feet unsupported Sitting balance-Leahy Scale: Good     Standing balance support: Bilateral upper extremity supported Standing balance-Leahy Scale: Fair                              Cognition Arousal/Alertness: Awake/alert Behavior During Therapy: WFL for tasks assessed/performed Overall Cognitive Status: Impaired/Different from baseline Area of Impairment: Safety/judgement;Following commands;Problem solving;Awareness                       Following Commands: Follows one step commands consistently;Follows multi-step commands with increased time Safety/Judgement: Decreased awareness of safety Awareness: Emergent Problem Solving: Difficulty sequencing;Requires tactile cues;Slow processing;Decreased initiation;Requires verbal cues General Comments: follows commands and  makes great efforts to integrate cuing/education; somewhat impulsive with limited attention to R hemi-body at times      Exercises Other Exercises Other Exercises: sidestepping left and right, backwards and forward gait without AD - fearful.  unable to process tandem gait cues.    General Comments         Pertinent Vitals/Pain Pain Assessment: No/denies pain    Home Living                      Prior Function            PT Goals (current goals can now be found in the care plan section) Progress towards PT goals: Progressing toward goals    Frequency    7X/week      PT Plan Current plan remains appropriate    Co-evaluation              AM-PAC PT "6 Clicks" Mobility   Outcome Measure  Help needed turning from your back to your side while in a flat bed without using bedrails?: None Help needed moving from lying on your back to sitting on the side of a flat bed without using bedrails?: A Little Help needed moving to and from a bed to a chair (including a wheelchair)?: A Little Help needed standing up from a chair using your arms (e.g., wheelchair or bedside chair)?: A Little Help needed to walk in hospital room?: A Little Help needed climbing 3-5 steps with a railing? : A Little 6 Click Score: 19    End of Session Equipment Utilized During Treatment: Gait belt Activity Tolerance: Patient tolerated treatment well;No increased pain Patient left: with call bell/phone within reach;in bed;with bed alarm set;with family/visitor present Nurse Communication: Mobility status       Time: [id number] PT Time Calculation (min) (ACUTE ONLY): 18 min  Charges:  $Gait Training: 8-22 mins                    Chesley Noon, PTA 05/31/19, 1:22 PM

## 2019-05-31 NOTE — Progress Notes (Signed)
PROGRESS NOTE    Gordon [name]  [id number] DOB: [birth year] DOA: 05/19/2019 PCP: System, Pcp Not In     Assessment & Plan:   Active Problems:   ST elevation myocardial infarction (STEMI) (HCC)   Atrial fibrillation with rapid ventricular response (HCC)   HFrEF (heart failure with reduced ejection fraction) (HCC)   Acute CVA (cerebrovascular accident) ([place])   Aphasia   PAF (paroxysmal atrial fibrillation) ([place])   Acute renal failure superimposed on stage 3a chronic kidney disease (HCC)   Stage 3b chronic kidney disease   Cerebrovascular accident (CVA) due to thrombosis of cerebral artery ([place])  Acute cortical/subcortical infarct within the left MCA vascular territory with small petechial hemorrhage: Repeat CT scan did not show any hemorrhage. Continue on eliquis, plavix. Continue neuro checks.   STEMI: s/p balloon angioplasty to RCA. Continue w/ medical management with plavix, atorvastatin and Coreg. Continue on tele. Cardio following and recs apprec  Paroxysmal atrial fibrillation: continue on coreg, eliquis. Continue on amiodarone 400 twice daily x 5 days then down to 200 twice daily as per cardio. Continue on tele. May have to be switched to lovenox and warfarin until INR is therapeutic if SNF cannot afford eliquis as per cardio  Generalized weakness: PT is recommending SNF. OT is recommending CIR. CM aware. Pt lives alone and unlikely to do well at home alone. Pt has no insurance so d/c will likely be difficult but found a place and pt will likely be d/c 12/21 or 12/22  Hyperlipidemia: continue on statin  Chronic kidney disease stage IIIb: Cr is trending up today. Avoid nephrotoxic meds. 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  No spironolactone with kidney injury. Continue on coreg, imdur, hydralazine     DVT prophylaxis: eliquis Code Status: full  Family  Communication:  Disposition Plan: SNF vs CIR but pt has no insurance so d/c to either of these places will be difficult. CM is working on this    Consultants:  Cardio   Procedures: n/a   Antimicrobials: n/a   Subjective: Pt c/o fatigue  Objective: Vitals:   05/30/19 1942 05/31/19 0130 05/31/19 0547 05/31/19 0805  BP: 125/88  135/82 (!) 140/95  Pulse: 71  (!) 59 (!) 112  Resp: 20  18 20   Temp: 98.2 F (36.8 C)  98.6 F (37 C) 98.8 F (37.1 C)  TempSrc: Oral  Oral Oral  SpO2: 98%  96% 97%  Weight:  73.2 kg    Height:        Intake/Output Summary (Last 24 hours) at 05/31/2019 0813 Last data filed at 05/31/2019 0001 Gross per 24 hour  Intake 86.81 ml  Output 250 ml  Net -163.19 ml   Filed Weights   05/27/19 0405 05/28/19 0450 05/31/19 0130  Weight: 75.8 kg 75.6 kg 73.2 kg    Examination:  General exam: Appears calm and comfortable  Respiratory system: decreased breath sounds b/l. No wheezes Cardiovascular system: S1 & S2 +. No  rubs, gallops or clicks. Gastrointestinal system: Abdomen is nondistended, soft and nontender.  Normal bowel sounds heard. Central nervous system: Alert and oriented. Moves all 4 extremities Psychiatry: normal mood and affect    Data Reviewed: I have personally reviewed following labs and imaging studies  CBC: Recent Labs  Lab 05/27/19 1311 05/29/19 0418 05/30/19 0506 05/31/19 0642  WBC 9.8 9.0 9.8 8.4  HGB 12.0* 10.8* 10.9* 10.9*  HCT 35.5*  34.1* 31.7* 33.0*  MCV 88.1 92.4 87.8 88.7  PLT 258 245 241 [id number]   Basic Metabolic Panel: Recent Labs  Lab 05/26/19 0530 05/27/19 0618 05/29/19 0418 05/30/19 0506 05/31/19 0642  NA 138 137 138 138 138  K 4.0 4.1 3.8 3.8 4.1  CL 107 106 109 108 105  CO2 21* 20* 20* 20* 22  GLUCOSE 91 100* 100* 101* 110*  BUN 28* 33* 35* 35* 39*  CREATININE 1.66* 1.76* 1.77* 1.75* 1.94*  CALCIUM 8.4* 8.5* 8.7* 8.4* 8.5*   GFR: Estimated Creatinine Clearance: 37.2 mL/min (A) (by C-G formula  based on SCr of 1.94 mg/dL (H)). Liver Function Tests: No results for input(s): AST, ALT, ALKPHOS, BILITOT, PROT, ALBUMIN in the last 168 hours. No results for input(s): LIPASE, AMYLASE in the last 168 hours. No results for input(s): AMMONIA in the last 168 hours. Coagulation Profile: No results for input(s): INR, PROTIME in the last 168 hours. Cardiac Enzymes: No results for input(s): CKTOTAL, CKMB, CKMBINDEX, TROPONINI in the last 168 hours. BNP (last 3 results) No results for input(s): PROBNP in the last 8760 hours. HbA1C: No results for input(s): HGBA1C in the last 72 hours. CBG: No results for input(s): GLUCAP in the last 168 hours. Lipid Profile: No results for input(s): CHOL, HDL, LDLCALC, TRIG, CHOLHDL, LDLDIRECT in the last 72 hours. Thyroid Function Tests: No results for input(s): TSH, T4TOTAL, FREET4, T3FREE, THYROIDAB in the last 72 hours. Anemia Panel: No results for input(s): VITAMINB12, FOLATE, FERRITIN, TIBC, IRON, RETICCTPCT in the last 72 hours. Sepsis Labs: No results for input(s): PROCALCITON, LATICACIDVEN in the last 168 hours.  No results found for this or any previous visit (from the past 240 hour(s)).       Radiology Studies: No results found.      Scheduled Meds: . amiodarone  400 mg Oral BID  . apixaban  5 mg Oral BID  . atorvastatin  40 mg Oral q1800  . Chlorhexidine Gluconate Cloth  6 each Topical Daily  . clopidogrel  75 mg Oral Daily  . feeding supplement (ENSURE ENLIVE)  237 mL Oral BID BM  . hydrALAZINE  25 mg Oral TID  . isosorbide mononitrate  60 mg Oral Daily  . metoprolol succinate  100 mg Oral Daily  . sodium chloride flush  3 mL Intravenous Q12H   Continuous Infusions: . sodium chloride       LOS: 12 days    Time spent: 30  mins    Wyvonnia Dusky, MD [facility] Pager [phone]  If 7PM-7AM, please contact night-coverage [email or web address] Password [facility] 05/31/2019, 8:13 AM

## 2019-05-31 NOTE — Progress Notes (Signed)
  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 He continues to show great motivation and improvement daily. Today he was able to name pictures with improved accuracy as well as complet sentences. Phonemic and semantic cues were helpful. Pt was encouraged to answer with one word responses for improved word and sound accuracy. Pt often gets frustrated or excited needing cues to slow down and take a break. Gordon [name] is extremely motivated and wants to work hard to return to [place] with speech and motor task.   HPI: Acute infarct with subsequent Aphasia and Apraxia            SLP Plan  Continue with current plan of care   Tx to focus on Apraxia and aphasia until discharged to rehab facility    Recommendations   Tx 5 x week at rehab facility                Plan: Continue with current plan of care       GO                Lucila Maine 05/31/2019, 4:45 PM

## 2019-05-31 NOTE — Plan of Care (Signed)
?  Problem: Clinical Measurements: ?Goal: Will remain free from infection ?Outcome: Progressing ?  ?Problem: Clinical Measurements: ?Goal: Diagnostic test results will improve ?Outcome: Progressing ?  ?

## 2019-06-01 LAB — BASIC METABOLIC PANEL
Anion gap: 9 (ref 5–15)
BUN: 42 mg/dL — ABNORMAL HIGH (ref 8–23)
CO2: 22 mmol/L (ref 22–32)
Calcium: 8.4 mg/dL — ABNORMAL LOW (ref 8.9–10.3)
Chloride: 102 mmol/L (ref 98–111)
Creatinine, Ser: 2.14 mg/dL — ABNORMAL HIGH (ref 0.61–1.24)
GFR calc Af Amer: 37 mL/min — ABNORMAL LOW (ref >60)
GFR calc non Af Amer: 32 mL/min — ABNORMAL LOW (ref >60)
Glucose, Bld: 105 mg/dL — ABNORMAL HIGH (ref 70–99)
Potassium: 4 mmol/L (ref 3.5–5.1)
Sodium: 133 mmol/L — ABNORMAL LOW (ref 135–145)

## 2019-06-01 LAB — CBC
HCT: 33.6 % — ABNORMAL LOW (ref 39.0–52.0)
Hemoglobin: 11.2 g/dL — ABNORMAL LOW (ref 13.0–17.0)
MCH: 29.2 pg (ref 26.0–34.0)
MCHC: 33.3 g/dL (ref 30.0–36.0)
MCV: 87.7 fL (ref 80.0–100.0)
Platelets: 241 10*3/uL (ref 150–400)
RBC: 3.83 MIL/uL — ABNORMAL LOW (ref 4.22–5.81)
RDW: 14.5 % (ref 11.5–15.5)
WBC: 9.4 10*3/uL (ref 4.0–10.5)
nRBC: 0 % (ref 0.0–0.2)

## 2019-06-01 MED ORDER — DIPHENHYDRAMINE HCL 12.5 MG/5ML PO ELIX
12.5000 mg | ORAL_SOLUTION | Freq: Once | ORAL | Status: AC
  Administered 2019-06-01: 12.5 mg via ORAL
  Filled 2019-06-01: qty 5

## 2019-06-01 NOTE — Plan of Care (Signed)
  Problem: Education: Goal: Knowledge of General Education information will improve Description: Including pain rating scale, medication(s)/side effects and non-pharmacologic comfort measures Outcome: Progressing   Problem: Clinical Measurements: Goal: Ability to maintain clinical measurements within normal limits will improve Outcome: Progressing Goal: Diagnostic test results will improve Outcome: Progressing   

## 2019-06-01 NOTE — Progress Notes (Signed)
PROGRESS NOTE    Gordon [name]  [id number] DOB: [birth year] DOA: 05/19/2019 PCP: System, Pcp Not In     Assessment & Plan:   Active Problems:   ST elevation myocardial infarction (STEMI) (HCC)   Atrial fibrillation with rapid ventricular response (HCC)   HFrEF (heart failure with reduced ejection fraction) (HCC)   Acute CVA (cerebrovascular accident) ([place])   Aphasia   PAF (paroxysmal atrial fibrillation) ([place])   Acute renal failure superimposed on stage 3a chronic kidney disease (HCC)   Stage 3b chronic kidney disease   Cerebrovascular accident (CVA) due to thrombosis of cerebral artery ([place])  Acute cortical/subcortical infarct within the left MCA vascular territory with small petechial hemorrhage: Repeat CT scan did not show any hemorrhage. Continue on eliquis, plavix. Continue neuro checks.   STEMI: s/p balloon angioplasty to RCA. Continue w/ medical management with plavix, atorvastatin and Coreg. Continue on tele. Cardio following and recs apprec  Paroxysmal atrial fibrillation: continue on coreg, eliquis. Continue on amiodarone 400 twice daily x 5 days then down to 200 twice daily as per cardio. Continue on tele. May have to be switched to lovenox and warfarin until INR is therapeutic if SNF cannot afford eliquis as per cardio  Generalized weakness: PT is recommending SNF. OT is recommending CIR. CM aware. Pt lives alone and unlikely to do well at home alone. Pt has no insurance so d/c will likely be difficult but CM found a place and pt will likely be d/c 12/21 or 12/22  Hyperlipidemia: continue on statin  Chronic kidney disease stage IIIb: Cr trending up again today. Encourage water intake. If Cr continues to increase tomorrow, will start IVFs. Avoid nephrotoxic meds. 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  No spironolactone with kidney injury. Continue on  coreg, imdur, hydralazine     DVT prophylaxis: eliquis Code Status: full  Family Communication:  Disposition Plan: SNF but pt has no insurance but CM was able to find a place for the pt and will likely be d/c 12/21 or 12/22   Consultants:  Cardio   Procedures: n/a   Antimicrobials: n/a   Subjective: Pt c/o malaise  Objective: Vitals:   05/31/19 1632 05/31/19 1921 06/01/19 0332 06/01/19 0743  BP: 139/81 (!) 153/88 121/83 136/81  Pulse: 61 75 60 67  Resp:  20 20 19   Temp:  99.1 F (37.3 C) 98.3 F (36.8 C) 98.2 F (36.8 C)  TempSrc:  Oral    SpO2: 99% 100% 98% 94%  Weight:      Height:        Intake/Output Summary (Last 24 hours) at 06/01/2019 0808 Last data filed at 05/31/2019 2315 Gross per 24 hour  Intake 240 ml  Output 0 ml  Net 240 ml   Filed Weights   05/27/19 0405 05/28/19 0450 05/31/19 0130  Weight: 75.8 kg 75.6 kg 73.2 kg    Examination:  General exam: Appears calm and comfortable  Respiratory system: diminished breath sounds b/l otherwise cleawr. No wheezes,rales Cardiovascular system: S1 & S2 +. No  rubs, gallops or clicks. Gastrointestinal system: Abdomen is nondistended, soft and nontender.  Normal bowel sounds heard. Central nervous system: Alert and oriented. Moves all 4 extremities Psychiatry: normal mood and affect    Data Reviewed: I have personally reviewed following labs and imaging studies  CBC: Recent Labs  Lab 05/27/19 1311 05/29/19 0418 05/30/19 0506 05/31/19 [id number] 06/01/19 [id number]  WBC 9.8 9.0 9.8 8.4 9.4  HGB 12.0* 10.8* 10.9* 10.9* 11.2*  HCT 35.5* 34.1* 31.7* 33.0* 33.6*  MCV 88.1 92.4 87.8 88.7 87.7  PLT 258 245 241 219 [id number]   Basic Metabolic Panel: Recent Labs  Lab 05/27/19 0618 05/29/19 0418 05/30/19 0506 05/31/19 0642 06/01/19 0623  NA 137 138 138 138 133*  K 4.1 3.8 3.8 4.1 4.0  CL 106 109 108 105 102  CO2 20* 20* 20* 22 22  GLUCOSE 100* 100* 101* 110* 105*  BUN 33* 35* 35* 39* 42*  CREATININE 1.76*  1.77* 1.75* 1.94* 2.14*  CALCIUM 8.5* 8.7* 8.4* 8.5* 8.4*   GFR: Estimated Creatinine Clearance: 33.7 mL/min (A) (by C-G formula based on SCr of 2.14 mg/dL (H)). Liver Function Tests: No results for input(s): AST, ALT, ALKPHOS, BILITOT, PROT, ALBUMIN in the last 168 hours. No results for input(s): LIPASE, AMYLASE in the last 168 hours. No results for input(s): AMMONIA in the last 168 hours. Coagulation Profile: No results for input(s): INR, PROTIME in the last 168 hours. Cardiac Enzymes: No results for input(s): CKTOTAL, CKMB, CKMBINDEX, TROPONINI in the last 168 hours. BNP (last 3 results) No results for input(s): PROBNP in the last 8760 hours. HbA1C: No results for input(s): HGBA1C in the last 72 hours. CBG: No results for input(s): GLUCAP in the last 168 hours. Lipid Profile: No results for input(s): CHOL, HDL, LDLCALC, TRIG, CHOLHDL, LDLDIRECT in the last 72 hours. Thyroid Function Tests: No results for input(s): TSH, T4TOTAL, FREET4, T3FREE, THYROIDAB in the last 72 hours. Anemia Panel: No results for input(s): VITAMINB12, FOLATE, FERRITIN, TIBC, IRON, RETICCTPCT in the last 72 hours. Sepsis Labs: No results for input(s): PROCALCITON, LATICACIDVEN in the last 168 hours.  No results found for this or any previous visit (from the past 240 hour(s)).       Radiology Studies: No results found.      Scheduled Meds: . amiodarone  400 mg Oral BID  . apixaban  5 mg Oral BID  . atorvastatin  40 mg Oral q1800  . Chlorhexidine Gluconate Cloth  6 each Topical Daily  . clopidogrel  75 mg Oral Daily  . feeding supplement (ENSURE ENLIVE)  237 mL Oral BID BM  . hydrALAZINE  25 mg Oral TID  . isosorbide mononitrate  60 mg Oral Daily  . metoprolol succinate  100 mg Oral Daily  . sodium chloride flush  3 mL Intravenous Q12H   Continuous Infusions: . sodium chloride       LOS: 13 days    Time spent: 30  mins    Wyvonnia Dusky, MD [facility] Pager [phone]  [phone]  If 7PM-7AM, please contact night-coverage [email or web address] Password [email or web address] 06/01/2019, 8:08 AM

## 2019-06-01 NOTE — Progress Notes (Signed)
Physical Therapy Treatment Patient Details Name: Gordon [name] MRN: [id number] DOB: 1955-09-23 Today's Date: 06/01/2019    History of Present Illness Gordon [name] is a 59yoM who comes [facility] 12[facility]7 c CP and gen weakness, notable HTN and AFcRVR, pt admitted c STEMI, HeadCT negaitve. Pt underwent a cardiac catheterization on 12/7 status post unsuccessful balloon angioplasty of the RCA due to diffuse disease, calcification and thrombus. Pt having expressive difficulty and motor control impairment of the right hemibody. Head CT/MRI showing acute CVA. PTA pt was fully independent, retired but looking ino to working part time in the future.    PT Comments    Pt ready for session.  Bed mobility without assist.  Pt ambulating with RW several laps around nursing station.  He feels more comfortable today and is able to leave walker to the side and continue in hallway without AD and work on higher level balance tasks including sidestepping, backwards, tandem, zig-zagging from wall to wall in hallway for turns and directional changes and picking up papers from floor x 3.  He did requires mod a x 1 for LOB while bending on one attempt that would have resulted in fall if no intervention given.  Upon return to room, he was able to attend to light housekeeping tasks in his room like cleaning off his table and making his bed with assist.  Pt felt more comfortable today without walker and with higher level balance tasks.  He continues with frustration at times over aphasia and not being able to communicate at times.  Seems to get more frustrated when fatigued.  At one point he tries to take a drink with his mask on.  After several attempts, he yells "I can't because of this mask!"  Cued pt to take it off to drink  "Oh."     Follow Up Recommendations  SNF;Supervision for mobility/OOB;Supervision/Assistance - 24 hour     Equipment Recommendations  Rolling walker with 5" wheels    Recommendations for Other Services        Precautions / Restrictions Precautions Precautions: Fall Restrictions Weight Bearing Restrictions: No    Mobility  Bed Mobility Overal bed mobility: Modified Independent Bed Mobility: Supine to Sit;Sit to Supine     Supine to sit: Modified independent (Device/Increase time) Sit to supine: Modified independent (Device/Increase time)      Transfers Overall transfer level: Needs assistance Equipment used: Rolling walker (2 wheeled);None Transfers: Sit to/from Stand Sit to Stand: Min guard            Ambulation/Gait Ambulation/Gait assistance: Min guard Gait Distance (Feet): 1000 Feet Assistive device: Rolling walker (2 wheeled);None Gait Pattern/deviations: Step-through pattern;Wide base of support         Stairs             Wheelchair Mobility    Modified Rankin (Stroke Patients Only)       Balance Overall balance assessment: Needs assistance Sitting-balance support: Feet supported;No upper extremity supported;Feet unsupported Sitting balance-Leahy Scale: Good     Standing balance support: Bilateral upper extremity supported Standing balance-Leahy Scale: Fair                              Cognition Arousal/Alertness: Awake/alert Behavior During Therapy: WFL for tasks assessed/performed Overall Cognitive Status: Impaired/Different from baseline Area of Impairment: Safety/judgement;Following commands;Problem solving;Awareness  Following Commands: Follows one step commands consistently;Follows multi-step commands with increased time Safety/Judgement: Decreased awareness of safety Awareness: Emergent Problem Solving: Difficulty sequencing;Requires tactile cues;Slow processing;Decreased initiation;Requires verbal cues General Comments: follows commands and makes great efforts to integrate cuing/education; somewhat impulsive with limited attention to R hemi-body at times      Exercises Other  Exercises Other Exercises: higher level balance tasks    General Comments        Pertinent Vitals/Pain Pain Assessment: No/denies pain    Home Living                      Prior Function            PT Goals (current goals can now be found in the care plan section) Progress towards PT goals: Progressing toward goals    Frequency    7X/week      PT Plan Current plan remains appropriate    Co-evaluation              AM-PAC PT "6 Clicks" Mobility   Outcome Measure  Help needed turning from your back to your side while in a flat bed without using bedrails?: None Help needed moving from lying on your back to sitting on the side of a flat bed without using bedrails?: A Little Help needed moving to and from a bed to a chair (including a wheelchair)?: A Little Help needed standing up from a chair using your arms (e.g., wheelchair or bedside chair)?: A Little Help needed to walk in hospital room?: A Little Help needed climbing 3-5 steps with a railing? : A Little 6 Click Score: 19    End of Session Equipment Utilized During Treatment: Gait belt Activity Tolerance: Patient tolerated treatment well;No increased pain Patient left: with call bell/phone within reach;in bed;with bed alarm set Nurse Communication: Mobility status       Time: [id number] PT Time Calculation (min) (ACUTE ONLY): 38 min  Charges:  $Gait Training: 23-37 mins $Therapeutic Activity: 8-22 mins                    Chesley Noon, PTA 06/01/19, 11:34 AM

## 2019-06-02 ENCOUNTER — Telehealth: Payer: Self-pay | Admitting: Cardiovascular Disease

## 2019-06-02 LAB — CBC
HCT: 34 % — ABNORMAL LOW (ref 39.0–52.0)
Hemoglobin: 11 g/dL — ABNORMAL LOW (ref 13.0–17.0)
MCH: 29.4 pg (ref 26.0–34.0)
MCHC: 32.4 g/dL (ref 30.0–36.0)
MCV: 90.9 fL (ref 80.0–100.0)
Platelets: 222 10*3/uL (ref 150–400)
RBC: 3.74 MIL/uL — ABNORMAL LOW (ref 4.22–5.81)
RDW: 14.3 % (ref 11.5–15.5)
WBC: 8.7 10*3/uL (ref 4.0–10.5)
nRBC: 0 % (ref 0.0–0.2)

## 2019-06-02 LAB — SARS CORONAVIRUS 2 (TAT 6-24 HRS): SARS Coronavirus 2: NEGATIVE

## 2019-06-02 LAB — BASIC METABOLIC PANEL
Anion gap: 12 (ref 5–15)
BUN: 42 mg/dL — ABNORMAL HIGH (ref 8–23)
CO2: 21 mmol/L — ABNORMAL LOW (ref 22–32)
Calcium: 8.5 mg/dL — ABNORMAL LOW (ref 8.9–10.3)
Chloride: 104 mmol/L (ref 98–111)
Creatinine, Ser: 2.24 mg/dL — ABNORMAL HIGH (ref 0.61–1.24)
GFR calc Af Amer: 35 mL/min — ABNORMAL LOW (ref >60)
GFR calc non Af Amer: 30 mL/min — ABNORMAL LOW (ref >60)
Glucose, Bld: 107 mg/dL — ABNORMAL HIGH (ref 70–99)
Potassium: 3.9 mmol/L (ref 3.5–5.1)
Sodium: 137 mmol/L (ref 135–145)

## 2019-06-02 MED ORDER — SODIUM CHLORIDE 0.9 % IV SOLN: INTRAVENOUS | Status: DC

## 2019-06-02 MED ORDER — SODIUM BICARBONATE 650 MG PO TABS
650.0000 mg | ORAL_TABLET | Freq: Two times a day (BID) | ORAL | Status: DC
  Administered 2019-06-02 – 2019-06-03 (×4): 650 mg via ORAL
  Filled 2019-06-02 (×4): qty 1

## 2019-06-02 NOTE — Progress Notes (Signed)
Physical Therapy Treatment Patient Details Name: Gordon [name] MRN: [id number] DOB: 1956/06/05 Today's Date: 06/02/2019    History of Present Illness Gordon [name] is a 24yoM who comes to [facility] 12/7 [name] CP and gen weakness, notable HTN and AFcRVR, pt admitted [name] STEMI, HeadCT negaitve. Pt underwent a cardiac catheterization on 12/7 status post unsuccessful balloon angioplasty of the RCA due to diffuse disease, calcification and thrombus. Pt having expressive difficulty and motor control impairment of the right hemibody. Head CT/MRI showing acute CVA. PTA pt was fully independent, retired but looking ino to working part time in the future.    PT Comments    Pt in room upon entry, just recently finished with SLP. Pt able to continued with unsupported gait training. Pt struggling with performance in following cues for Rt side stepping, as well as struggles avoiding obstacles in hallway walking into several objects attached to the wall. Author suspects some cognitive fatigue which is impairing performance with PT session. Pt progressing well overall.    Follow Up Recommendations  SNF;Supervision for mobility/OOB;Supervision/Assistance - 24 hour     Equipment Recommendations  Rolling walker with 5" wheels    Recommendations for Other Services       Precautions / Restrictions Precautions Precautions: Fall Restrictions Weight Bearing Restrictions: No    Mobility  Bed Mobility Overal bed mobility: Modified Independent Bed Mobility: Supine to Sit;Sit to Supine     Supine to sit: Supervision Sit to supine: Supervision   General bed mobility comments: cued pt to manage his linen independently for in/out of bed  Transfers Overall transfer level: Needs assistance Equipment used: None Transfers: Sit to/from Stand Sit to Stand: Min guard         General transfer comment: aware of instability, no LOB with several performances in session  Ambulation/Gait Ambulation/Gait assistance: Min  guard Gait Distance (Feet): 700 Feet Assistive device: None   Gait velocity: 0.64 on 12/21; 0.49m/s on 12/17; 0.60m/s on 12/15; 0.43m/s on 12/13       Stairs             Wheelchair Mobility    Modified Rankin (Stroke Patients Only)       Balance Overall balance assessment: Needs assistance           Standing balance-Leahy Scale: Fair                              Cognition Arousal/Alertness: Awake/alert Behavior During Therapy: WFL for tasks assessed/performed Overall Cognitive Status: Impaired/Different from baseline Area of Impairment: Problem solving;Awareness                       Following Commands: Follows one step commands consistently;Follows multi-step commands with increased time;Follows multi-step commands inconsistently Safety/Judgement: Decreased awareness of safety Awareness: Emergent;Anticipatory Problem Solving: Difficulty sequencing;Requires tactile cues;Slow processing;Decreased initiation;Requires verbal cues        Exercises Other Exercises Other Exercises: lateral step over electrical cord 15x bilat; steps on or trips over [name] Rt foot 25% of instances; typically turns body to face forward. Other Exercises: side stepping in hallway unsupported, minGuard assist. does well with moving to left, but struggles with cues to perform right sided stepping, eventually needs a 2nd person to provide a visual cue    General Comments        Pertinent Vitals/Pain Pain Assessment: No/denies pain    Home Living  Prior Function            PT Goals (current goals can now be found in the care plan section) Acute Rehab PT Goals Patient Stated Goal: Want to get better and want to go to rehab for a lot of therapy PT Goal Formulation: With patient Time For Goal Achievement: 06/04/19 Potential to Achieve Goals: Fair Progress towards PT goals: Progressing toward goals    Frequency    7X/week       PT Plan Current plan remains appropriate    Co-evaluation              AM-PAC PT "6 Clicks" Mobility   Outcome Measure  Help needed turning from your back to your side while in a flat bed without using bedrails?: None Help needed moving from lying on your back to sitting on the side of a flat bed without using bedrails?: A Little Help needed moving to and from a bed to a chair (including a wheelchair)?: A Little Help needed standing up from a chair using your arms (e.g., wheelchair or bedside chair)?: A Little Help needed to walk in hospital room?: A Little Help needed climbing 3-5 steps with a railing? : A Little 6 Click Score: 19    End of Session Equipment Utilized During Treatment: Gait belt Activity Tolerance: Patient tolerated treatment well;No increased pain Patient left: with call bell/phone within reach;in bed Nurse Communication: Mobility status PT Visit Diagnosis: Unsteadiness on feet (R26.81);Difficulty in walking, not elsewhere classified (R26.2);Apraxia (R48.2);Ataxic gait (R26.0);Other abnormalities of gait and mobility (R26.89)     Time: 1115-1140 PT Time Calculation (min) (ACUTE ONLY): 25 min  Charges:  $Neuromuscular Re-education: 23-37 mins                     12:40 PM, 06/02/19 Etta Grandchild, PT, DPT Physical Therapist - [facility]  [phone] ([place])     Gordon [name] 06/02/2019, 12:37 PM

## 2019-06-02 NOTE — Telephone Encounter (Signed)
TCM....  Patient is being discharged     They are scheduled to see Urban Gibson on 12/28 at 32 . ( Dr. Fletcher Anon booked for time frame )   They were seen for inferior MI with paroxysmal atrial fibrillation.   They need to be seen within 1-2 wks     Please call

## 2019-06-02 NOTE — TOC Progression Note (Signed)
Transition of Care [facility] - Progression Note    Patient Details  Name: Forrester Cotrell MRN: [id number] Date of Birth: 14-Feb-1956  Transition of Care [facility]) CM/SW Contact  Ross Ludwig, [place] Phone Number: 06/02/2019, 7:33 PM  Clinical Narrative:     CSW spoke with [facility] in [place], they have agreed to accept patient tomorrow once his Covid results are back.  Patient will be a new Eliquis patient, and since patient does not have insurance, CSW will need a coupon for the Eliquis.  CSW spoke to patient's niece, who will go to the [place] location to get patient's medication and bring it to the hospital unopened so patient can discharge with it to the SNF.  [facility] will accept patient with a 30 day LOG for rehab, and room and board.  Plan is for patient to discharge tomorrow if he is medically ready for discharge and orders have been received.      Expected Discharge Plan and Services  SNF for short term rehab.                                               Social Determinants of Health (SDOH) Interventions    Readmission Risk Interventions No flowsheet data found.

## 2019-06-02 NOTE — Plan of Care (Signed)
  Problem: Education: Goal: Knowledge of General Education information will improve Description: Including pain rating scale, medication(s)/side effects and non-pharmacologic comfort measures Outcome: Progressing   Problem: Clinical Measurements: Goal: Respiratory complications will improve Outcome: Progressing Goal: Cardiovascular complication will be avoided Outcome: Progressing   

## 2019-06-02 NOTE — Telephone Encounter (Signed)
-----   Message from Arvil Chaco, PA-C sent at 05/31/2019 11:15 AM EST ----- Hello,  This patient was admitted and seen at [facility] for late presenting inferior MI with paroxysmal atrial fibrillation.  We are expecting discharge today 05/31/2019.  Can you please call and arrange / schedule for follow-up TCM appointment with Dr. Fletcher Anon within the next 1-two weeks?  Thank you!  Signed, Arvil Chaco, PA-C 05/31/2019, 11:15 AM Pager [phone]

## 2019-06-02 NOTE — Progress Notes (Signed)
PROGRESS NOTE    Gordon [name]  [id number] DOB: 09/13/1955 DOA: 05/19/2019 PCP: System, Pcp Not In     Assessment & Plan:   Active Problems:   ST elevation myocardial infarction (STEMI) (HCC)   Atrial fibrillation with rapid ventricular response (HCC)   HFrEF (heart failure with reduced ejection fraction) (HCC)   Acute CVA (cerebrovascular accident) ([place])   Aphasia   PAF (paroxysmal atrial fibrillation) ([place])   Acute renal failure superimposed on stage 3a chronic kidney disease (HCC)   Stage 3b chronic kidney disease   Cerebrovascular accident (CVA) due to thrombosis of cerebral artery ([place])  Acute cortical/subcortical infarct within the left MCA vascular territory with small petechial hemorrhage: Repeat CT scan did not show any hemorrhage. Continue on eliquis, plavix. Continue neuro checks.   STEMI: s/p balloon angioplasty to RCA. Continue w/ medical management with plavix, atorvastatin and Coreg. Continue on tele. Cardio following and recs apprec  Paroxysmal atrial fibrillation: continue on coreg, eliquis. Continue on amiodarone 400 twice daily x 5 days then down to 200 twice daily as per cardio. Continue on tele. May have to be switched to lovenox and warfarin until INR is therapeutic if SNF cannot afford eliquis as per cardio  Generalized weakness: PT is recommending SNF. OT is recommending CIR. CM aware. Pt lives alone and unlikely to do well at home alone. Pt has no insurance so d/c will likely be difficult but CM found a place and pt will likely be able to d/c 12/21 or 12/22. Repeat COVID19 test requested and results are pending   Hyperlipidemia: continue on statin  Chronic kidney disease stage IIIb: Cr continues to trend up.Will start IVFs. Avoid nephrotoxic meds. Will continue to monitor  Metabolic acidosis: restart bicarb  Cardiomyopathy with chronic systolic congestive heart failure: No ACE or ARB with kidney injury.  No spironolactone with kidney injury. Continue on  coreg, imdur, hydralazine     DVT prophylaxis: eliquis Code Status: full  Family Communication:  Disposition Plan: SNF but pt has no insurance but CM was able to find a place for the pt and will likely be d/c 12/21 or 12/22. Awaiting repeat COVID19 results    Consultants:  Cardio   Procedures: n/a   Antimicrobials: n/a   Subjective: Pt c/o fatigue  Objective: Vitals:   06/01/19 1658 06/01/19 2038 06/02/19 0604 06/02/19 0753  BP:  131/78 124/85 137/83  Pulse: 64 (!) 50 (!) 54 (!) 57  Resp:  20 20   Temp:  98.6 F (37 C) 98.7 F (37.1 C) 98.4 F (36.9 C)  TempSrc:  Oral Oral Oral  SpO2:  98% 97% 97%  Weight:      Height:        Intake/Output Summary (Last 24 hours) at 06/02/2019 0817 Last data filed at 06/02/2019 [id number] Gross per 24 hour  Intake 963 ml  Output 625 ml  Net 338 ml   Filed Weights   05/27/19 0405 05/28/19 0450 05/31/19 0130  Weight: 75.8 kg 75.6 kg 73.2 kg    Examination:  General exam: Appears calm and comfortable  Respiratory system: decreased breath sounds b/l otherwise clear. No wheezes,rhonchi Cardiovascular system: S1 & S2 +. No  rubs, gallops or clicks. Gastrointestinal system: Abdomen is nondistended, soft and nontender.  Normal bowel sounds heard. Central nervous system: Alert and oriented. Moves all 4 extremities Psychiatry: normal mood and affect    Data Reviewed: I have personally reviewed following labs and imaging studies  CBC: Recent Labs  Lab 05/29/19 0418  05/30/19 0506 05/31/19 0642 06/01/19 0623 06/02/19 0447  WBC 9.0 9.8 8.4 9.4 8.7  HGB 10.8* 10.9* 10.9* 11.2* 11.0*  HCT 34.1* 31.7* 33.0* 33.6* 34.0*  MCV 92.4 87.8 88.7 87.7 90.9  PLT 245 241 219 241 [id number]   Basic Metabolic Panel: Recent Labs  Lab 05/29/19 0418 05/30/19 0506 05/31/19 0642 06/01/19 0623 06/02/19 0447  NA 138 138 138 133* 137  K 3.8 3.8 4.1 4.0 3.9  CL 109 108 105 102 104  CO2 20* 20* 22 22 21*  GLUCOSE 100* 101* 110* 105* 107*  BUN  35* 35* 39* 42* 42*  CREATININE 1.77* 1.75* 1.94* 2.14* 2.24*  CALCIUM 8.7* 8.4* 8.5* 8.4* 8.5*   GFR: Estimated Creatinine Clearance: 32.2 mL/min (A) (by C-G formula based on SCr of 2.24 mg/dL (H)). Liver Function Tests: No results for input(s): AST, ALT, ALKPHOS, BILITOT, PROT, ALBUMIN in the last 168 hours. No results for input(s): LIPASE, AMYLASE in the last 168 hours. No results for input(s): AMMONIA in the last 168 hours. Coagulation Profile: No results for input(s): INR, PROTIME in the last 168 hours. Cardiac Enzymes: No results for input(s): CKTOTAL, CKMB, CKMBINDEX, TROPONINI in the last 168 hours. BNP (last 3 results) No results for input(s): PROBNP in the last 8760 hours. HbA1C: No results for input(s): HGBA1C in the last 72 hours. CBG: No results for input(s): GLUCAP in the last 168 hours. Lipid Profile: No results for input(s): CHOL, HDL, LDLCALC, TRIG, CHOLHDL, LDLDIRECT in the last 72 hours. Thyroid Function Tests: No results for input(s): TSH, T4TOTAL, FREET4, T3FREE, THYROIDAB in the last 72 hours. Anemia Panel: No results for input(s): VITAMINB12, FOLATE, FERRITIN, TIBC, IRON, RETICCTPCT in the last 72 hours. Sepsis Labs: No results for input(s): PROCALCITON, LATICACIDVEN in the last 168 hours.  No results found for this or any previous visit (from the past 240 hour(s)).       Radiology Studies: No results found.      Scheduled Meds: . amiodarone  400 mg Oral BID  . apixaban  5 mg Oral BID  . atorvastatin  40 mg Oral q1800  . Chlorhexidine Gluconate Cloth  6 each Topical Daily  . clopidogrel  75 mg Oral Daily  . feeding supplement (ENSURE ENLIVE)  237 mL Oral BID BM  . hydrALAZINE  25 mg Oral TID  . isosorbide mononitrate  60 mg Oral Daily  . metoprolol succinate  100 mg Oral Daily  . sodium chloride flush  3 mL Intravenous Q12H   Continuous Infusions: . sodium chloride    . sodium chloride       LOS: 14 days    Time spent: 30   mins    Wyvonnia Dusky, MD [facility] Pager [phone]  If 7PM-7AM, please contact night-coverage [email or web address] Password [facility] 06/02/2019, 8:17 AM

## 2019-06-02 NOTE — Progress Notes (Signed)
Occupational Therapy Treatment Patient Details Name: Gordon [name] MRN: [id number] DOB: 14-Jan-1956 Today's Date: 06/02/2019    History of present illness [name] is a 15yoM who comes to [facility] 12/7 c CP and gen weakness, notable HTN and AFcRVR, pt admitted c STEMI, HeadCT negaitve. Pt underwent a cardiac catheterization on 12/7 status post unsuccessful balloon angioplasty of the RCA due to diffuse disease, calcification and thrombus. Pt having expressive difficulty and motor control impairment of the right hemibody. Head CT/MRI showing acute CVA. PTA pt was fully independent, retired but looking ino to working part time in the future.     OT comments     Pt seen for OT session and was sitting up in bed finishing lunch upon therapist's arrival.  He continues to be eager to improve his ability to speak and increase functional use of RUE and hand as well as integration of R and L hands for ADLs.  He did well with one step commands to use dry shampoo while standing with supervision to CGA for balance and slowing down with movements.  Improved awareness with R side and able to use comb with better control 80% of time and improved ability to name items in room and for ADLs with improvement and less frustration today. Continue to rec SNF.    Follow Up Recommendations  SNF    Equipment Recommendations  3 in 1 bedside commode    Recommendations for Other Services      Precautions / Restrictions Precautions Precautions: Fall Restrictions Weight Bearing Restrictions: No       Mobility Bed Mobility Overal bed mobility: Modified Independent Bed Mobility: Supine to Sit;Sit to Supine     Supine to sit: Supervision Sit to supine: Supervision   General bed mobility comments: cued pt to manage his linen independently for in/out of bed  Transfers Overall transfer level: Needs assistance Equipment used: None Transfers: Sit to/from Stand Sit to Stand: Min guard         General transfer  comment: aware of instability, no LOB with several performances in session    Balance Overall balance assessment: Needs assistance           Standing balance-Leahy Scale: Fair                             ADL either performed or assessed with clinical judgement   ADL Overall ADL's : Needs assistance/impaired       Grooming Details (indicate cue type and reason): Pt was eager to work in therapy and stood at sink with LUE support on counter top and CGA and with verbal cues for sequencing instructed to perform grooming tasks using R hand. Improved awareness of R side this date. Pt was able to open the tube and state he was using a toothbrush with minimal difficulty and then apply toothpaste squeezing it with his R hand onto the toothbrush for adequate amount. He became a little frustrated when he dropped the toothpaste but was able to move past it without cues. Min cues and assist to reposition the toothbrush in his R hand with min pressure applied.                               General ADL Comments: Pt  cotninues to be motivated and focused on trying to say names of items used for self care skills and needed cues to  calm down when getting angry with name identification but improved from last week.  Pt does well with posiitve affirmation and encouragement and able to state simple words for ADLs today and read words on R side of room with more initiation of scanning to R and L.     Vision Baseline Vision/History: Wears glasses Wears Glasses: Reading only     Perception     Praxis      Cognition Arousal/Alertness: Awake/alert Behavior During Therapy: WFL for tasks assessed/performed;Anxious Overall Cognitive Status: Impaired/Different from baseline Area of Impairment: Problem solving;Awareness                       Following Commands: Follows one step commands consistently;Follows multi-step commands with increased time;Follows multi-step commands  inconsistently Safety/Judgement: Decreased awareness of safety Awareness: Emergent;Anticipatory Problem Solving: Difficulty sequencing;Requires tactile cues;Slow processing;Decreased initiation;Requires verbal cues General Comments: follows commands and makes great efforts to integrate cuing/education; somewhat impulsive with limited attention to R hemi-body at times        Exercises Other Exercises Other Exercises: lateral step over electrical cord 15x bilat; steps on or trips over c Rt foot 25% of instances; typically turns body to face forward. Other Exercises: side stepping in hallway unsupported, minGuard assist. does well with moving to left, but struggles with cues to perform right sided stepping, eventually needs a 2nd person to provide a visual cue   Shoulder Instructions       General Comments      Pertinent Vitals/ Pain       Pain Assessment: No/denies pain  Home Living                                          Prior Functioning/Environment              Frequency  Min 2X/week        Progress Toward Goals  OT Goals(current goals can now be found in the care plan section)  Progress towards OT goals: Progressing toward goals  Acute Rehab OT Goals Patient Stated Goal: Want to get better and want to go to rehab for a lot of therapy OT Goal Formulation: With patient Time For Goal Achievement: 06/05/19 Potential to Achieve Goals: Good  Plan Discharge plan remains appropriate    Co-evaluation                 AM-PAC OT "6 Clicks" Daily Activity     Outcome Measure   Help from another person eating meals?: A Little Help from another person taking care of personal grooming?: A Little Help from another person toileting, which includes using toliet, bedpan, or urinal?: A Little Help from another person bathing (including washing, rinsing, drying)?: A Lot Help from another person to put on and taking off regular upper body clothing?: A  Little Help from another person to put on and taking off regular lower body clothing?: A Lot 6 Click Score: 16    End of Session Equipment Utilized During Treatment: Gait belt  OT Visit Diagnosis: Other abnormalities of gait and mobility (R26.89);Hemiplegia and hemiparesis;Apraxia (R48.2);Feeding difficulties (R63.3) Hemiplegia - Right/Left: Right Hemiplegia - dominant/non-dominant: Dominant Hemiplegia - caused by: Cerebral infarction   Activity Tolerance Patient tolerated treatment well   Patient Left in bed;with bed alarm set;with call bell/phone within reach   Nurse Communication  Time: [id number] OT Time Calculation (min): 25 min  Charges: OT General Charges $OT Visit: 1 Visit OT Treatments $Self Care/Home Management : 23-37 mins  Chrys Racer, OTR/L, [place] ascom [phone] 06/02/19, 1:02 PM

## 2019-06-02 NOTE — Progress Notes (Signed)
  Speech Language Pathology Treatment: Cognitive-Linquistic  Patient Details Name: Gordon [name] MRN: [id number] DOB: Jan 11, 1956 Today's Date: 06/02/2019 Time: 1030-1100 SLP Time Calculation (min) (ACUTE ONLY): 30 min  Assessment / Plan / Recommendation Clinical Impression  Patient, with moderate aphasia/apraxia, seen for ongoing treatment for expressive aphasia and apraxia.  Patient is responsive to semantic / phonemic cues, reminders to speak slowly, and try to remain calm in the face of aphasic and apraxic errors.  The patient's functional communication is improving and can be expected to continue to improve with intensive therapy.  HPI        SLP Plan  Continue with current plan of care       Recommendations   Intensive speech therapy in next setting                Plan: Continue with current plan of care       [place], [place], Susie 06/02/2019, 12:19 PM

## 2019-06-03 LAB — CBC
HCT: 32.2 % — ABNORMAL LOW (ref 39.0–52.0)
Hemoglobin: 10.5 g/dL — ABNORMAL LOW (ref 13.0–17.0)
MCH: 29.8 pg (ref 26.0–34.0)
MCHC: 32.6 g/dL (ref 30.0–36.0)
MCV: 91.5 fL (ref 80.0–100.0)
Platelets: 220 10*3/uL (ref 150–400)
RBC: 3.52 MIL/uL — ABNORMAL LOW (ref 4.22–5.81)
RDW: 14.4 % (ref 11.5–15.5)
WBC: 7.9 10*3/uL (ref 4.0–10.5)
nRBC: 0 % (ref 0.0–0.2)

## 2019-06-03 LAB — BASIC METABOLIC PANEL
Anion gap: 12 (ref 5–15)
BUN: 48 mg/dL — ABNORMAL HIGH (ref 8–23)
CO2: 19 mmol/L — ABNORMAL LOW (ref 22–32)
Calcium: 8.3 mg/dL — ABNORMAL LOW (ref 8.9–10.3)
Chloride: 106 mmol/L (ref 98–111)
Creatinine, Ser: 1.89 mg/dL — ABNORMAL HIGH (ref 0.61–1.24)
GFR calc Af Amer: 43 mL/min — ABNORMAL LOW (ref >60)
GFR calc non Af Amer: 37 mL/min — ABNORMAL LOW (ref >60)
Glucose, Bld: 95 mg/dL (ref 70–99)
Potassium: 3.7 mmol/L (ref 3.5–5.1)
Sodium: 137 mmol/L (ref 135–145)

## 2019-06-03 MED ORDER — ISOSORBIDE MONONITRATE ER 60 MG PO TB24: 60.0000 mg | ORAL_TABLET | Freq: Every day | ORAL | 0 refills | Status: DC

## 2019-06-03 MED ORDER — CLOPIDOGREL BISULFATE 75 MG PO TABS: 75.0000 mg | ORAL_TABLET | Freq: Every day | ORAL | 0 refills | Status: AC

## 2019-06-03 MED ORDER — AMIODARONE HCL 400 MG PO TABS: 400.0000 mg | ORAL_TABLET | Freq: Two times a day (BID) | ORAL | 0 refills | Status: DC

## 2019-06-03 MED ORDER — ATORVASTATIN CALCIUM 40 MG PO TABS: 40.0000 mg | ORAL_TABLET | Freq: Every day | ORAL | 0 refills | Status: DC

## 2019-06-03 MED ORDER — APIXABAN 5 MG PO TABS: 5.0000 mg | ORAL_TABLET | Freq: Two times a day (BID) | ORAL | 0 refills | Status: DC

## 2019-06-03 MED ORDER — HYDRALAZINE HCL 25 MG PO TABS: 25.0000 mg | ORAL_TABLET | Freq: Three times a day (TID) | ORAL | 0 refills | Status: DC

## 2019-06-03 MED ORDER — AMIODARONE HCL 200 MG PO TABS: 200.0000 mg | ORAL_TABLET | Freq: Two times a day (BID) | ORAL | 0 refills | Status: DC

## 2019-06-03 MED ORDER — ACETAMINOPHEN 325 MG PO TABS: 650.0000 mg | ORAL_TABLET | Freq: Four times a day (QID) | ORAL | 0 refills | Status: AC | PRN

## 2019-06-03 MED ORDER — METOPROLOL SUCCINATE ER 100 MG PO TB24: 100.0000 mg | ORAL_TABLET | Freq: Every day | ORAL | 0 refills | Status: DC

## 2019-06-03 NOTE — Progress Notes (Signed)
Multiple attempts made to call report. Placed on hold multiple times and call goes to voicemail. Final attempt left voicemail informing them that nurse unable to give report d/t being sent to a voicemail multiple times and pt will be coming to room 107 and left unit for them to call if they want report.

## 2019-06-03 NOTE — TOC Transition Note (Signed)
Transition of Care [facility]) - CM/SW Discharge Note   Patient Details  Name: Gordon [name] MRN: [id number] Date of Birth: March 04, 1956  [facility] CM/SW Contact:  Ross Ludwig, LCSW Phone Number: 06/03/2019, 6:09 PM   Clinical Narrative:    Patient has been accepted to [facility] on a 30 day LOG for room and board, and therapy services.  CSW spoke to [place], she can accept patient today.  Patient will be discharging on Eliquis, CSW was able to have the physician call in the prescription to [facility], and patient's family were able to use the free 30 day trial for Eliquis to pick up patient's medication.  CSW was informed by the SNF that if family can bring the Eliquis to the hospital, and the medication goes with the patient, then SNF can accept patient.  Patient's Eliquis was [facility] by [facility] and sealed, it is to go with the patient to SNF once EMS arrives.  CSW contacted [facility] and spoke to [place], [phone], per Arnette Norris, they are very busy today, and only have one convalescent truck running, CSW offered to call [facility] to see if they are able to pick patient up from hospital and take him to SNF in [place].  CSW contacted [place], [phone] and they said they will put him on the schedule for around 5pm, however they are running a couple hours behind schedule due to increased need for EMS transport, so it will be later then 5pm, but they are able to transport him to [facility].  Patient to be d/c'ed today to [facility] room 107, patient and family agreeable to plans will transport via ems RN to call report to [phone].  [facility] after hours EMS [phone] if bedside nurse needs to get a hold of EMS.     Final next level of care: Skilled Nursing Facility Barriers to Discharge: Barriers Resolved   Patient Goals and CMS Choice Patient states their goals for this hospitalization and ongoing recovery are:: To return home after receiving short term  rehab. CMS Medicare.gov Compare Post Acute Care list provided to:: Patient Choice offered to / list presented to : Patient  Discharge Placement PASRR number recieved: 05/26/19            Patient chooses bed at: Other - please specify in the comment section below:([facility] in [place]) Patient to be transferred to facility by: [facility] Name of family member notified: Patient's niece Loma Sousa [phone] Patient and family notified of of transfer: 06/03/19  Discharge Plan and Services                DME Arranged: N/A         HH Arranged: NA          Social Determinants of Health (SDOH) Interventions     Readmission Risk Interventions No flowsheet data found.

## 2019-06-03 NOTE — Progress Notes (Signed)
IV d/c'd tip intact. Tele monitor removed and returned.

## 2019-06-03 NOTE — Discharge Summary (Addendum)
Physician Discharge Summary  Gordon [name] [id number] DOB: [birth year] DOA: 05/19/2019  PCP: System, Pcp Not In  Admit date: 05/19/2019 Discharge date: 06/03/2019  Admitted From: home Disposition:  SNF Recommendations for Outpatient Follow-up:  1. Follow up with PCP in 1-2 weeks 2. F/U cardio (Dr. Fletcher Anon)  in 1-2 weeks 3. F/u neuro in 1-2 weeks   Home Health:  Equipment/Devices: n/a  Discharge Condition: stable  CODE STATUS: full  Diet recommendation: Heart Healthy   Brief/Interim Summary: HPI taken from Dr. Fletcher Anon Gordon [name] is a 63 year old male with known history of tobacco use and no prior cardiac history.  He reports known history of essential hypertension but he has not taken any medications or seen a physician in many years.  He lives by himself.  He reports intermittent substernal chest pain described as tightness feeling which started about 1 week ago.  This has been happening more frequently and became severe this morning and thus he called EMS.  His sister was present at the bedside and reported that the patient did not look good during Thanksgiving dinner and he did complain of chest pain at that time and weakness but did not want to seek medical attention.  However, his symptoms worsened this morning and he also had periods of confusion with significant shortness of breath and dizziness.  Thus, he called EMS.  He was noted to be in A. fib with RVR and was hypertensive on presentation.  His initial EKG showed inferior ST elevation but the code STEMI was not called given concerns about his mental status and A. fib with RVR.  Subsequently, his troponin came back elevated at 3500 and the patient was given diltiazem.  EKG was repeated and the inferior ST elevation was still persistent.  I was contacted at this point and given the significant EKG changes in the patient's symptoms, I activated a code STEMI.  However, given the patient's intermittent confusion, I recommended obtaining a CT scan  of the head before proceeding with cath.  This was done and showed no evidence of bleeding.  The patient was given aspirin, heparin bolus and was placed on diltiazem drip for rate control.  Hospital Course from Dr. Lenise Herald 05/28/19-06/03/19: Pt was found to have a acute left MCA territory w/ small petechial hemorrhage. Repeat imaging did not show any image. Pt was started on eliquis & plavix. PT/OT saw the pt and recommended SNF. Of note, pt was found to have STEMI s/p balloon angioplasty to RCA. Pt was continued on medical management w/ plavix, atorvastatin, & coreg. Furthermore, pt found to have a. Fib and pt was started on  amiodarone as well. Pt does not have insurance but CM was able to find a SNF that will take the pt as pt lives alone and would not do well alone.    Discharge Diagnoses:  Active Problems:   ST elevation myocardial infarction (STEMI) (HCC)   Atrial fibrillation with rapid ventricular response (HCC)   HFrEF (heart failure with reduced ejection fraction) (HCC)   Acute CVA (cerebrovascular accident) ([place])   Aphasia   PAF (paroxysmal atrial fibrillation) ([place])   Acute renal failure superimposed on stage 3a chronic kidney disease (HCC)   Stage 3b chronic kidney disease   Cerebrovascular accident (CVA) due to thrombosis of cerebral artery ([place])  Acute cortical/subcortical infarct within the left MCA vascular territory with small petechial hemorrhage: Repeat CT scan did not show any hemorrhage. Continue on eliquis, plavix. Continue neuro checks.   STEMI: s/p balloon  angioplasty to RCA. Continue w/ medical management with plavix, atorvastatin and Coreg. Continue on tele. Cardio following and recs apprec  Paroxysmal atrial fibrillation: continue on coreg, eliquis. Continue on amiodarone 400 twice daily x 5 days then down to 200 twice daily as per cardio. Continue on tele. May have to be switched to lovenox and warfarin until INR is therapeutic if SNF cannot afford eliquis as  per cardio  Generalized weakness: PT is recommending SNF. Pt lives alone and unlikely to do well at home alone. Pt has no insurance so d/c will likely be difficult but CM found a place and pt will likely be able to d/c12/22. Repeat COVID19 test neg  Hyperlipidemia: continue on statin  Chronic kidney disease stage IIIb: Cr is trending down today. Continue on IVFs. Avoid nephrotoxic meds. 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 No spironolactone with kidney injury. Continue on coreg, imdur, hydralazine   Discharge Instructions  Discharge Instructions    AMB Referral to Cardiac Rehabilitation - Phase II   Complete by: As directed    Diagnosis:  STEMI PTCA     After initial evaluation and assessments completed: Virtual Based Care may be provided alone or in conjunction with Phase 2 Cardiac Rehab based on patient barriers.: Yes   Ambulatory referral to Physical Medicine Rehab   Complete by: As directed    Seen by Dr. Ranell Patrick on consults   Diet - low sodium heart healthy   Complete by: As directed    Discharge instructions   Complete by: As directed    F/U PCP in 1-2 weeks; F/U cardio in 1-2 weeks; F/u neuro in 1-2 weeks   Increase activity slowly   Complete by: As directed      Allergies as of 06/03/2019   No Known Allergies     Medication List    TAKE these medications   acetaminophen 325 MG tablet Commonly known as: TYLENOL Take 2 tablets (650 mg total) by mouth every 6 (six) hours as needed for mild pain or headache.   amiodarone 400 MG tablet Commonly known as: PACERONE Take 1 tablet (400 mg total) by mouth 2 (two) times daily for 1 day.   amiodarone 200 MG tablet Commonly known as: Pacerone Take 1 tablet (200 mg total) by mouth 2 (two) times daily.   apixaban 5 MG Tabs tablet Commonly known as: ELIQUIS Take 1 tablet (5 mg total) by  mouth 2 (two) times daily.   atorvastatin 40 MG tablet Commonly known as: LIPITOR Take 1 tablet (40 mg total) by mouth daily at 6 PM.   clopidogrel 75 MG tablet Commonly known as: PLAVIX Take 1 tablet (75 mg total) by mouth daily. Start taking on: June 04, 2019   hydrALAZINE 25 MG tablet Commonly known as: APRESOLINE Take 1 tablet (25 mg total) by mouth 3 (three) times daily.   isosorbide mononitrate 60 MG 24 hr tablet Commonly known as: IMDUR Take 1 tablet (60 mg total) by mouth daily. Start taking on: June 04, 2019   metoprolol succinate 100 MG 24 hr tablet Commonly known as: TOPROL-XL Take 1 tablet (100 mg total) by mouth daily. Take with or immediately following a meal. Start taking on: June 04, 2019       Contact information for follow-up providers    [facility] Cardiac and Pulmonary Rehab Follow up.   Specialty: Cardiac Rehabilitation Why: Your Cardiologist has  referred you to outpatient Cardiac Rehab at [facility].  Will defer for now until patient has completed inpatient rehabilitation for stroke.   Contact information: [place] [id number] [place]       Wellington Hampshire, MD. Go on 06/09/2019.   Specialty: Cardiology Why: APPOINTMENT AT 11:30AM Contact information: [place] [address] [phone]            Contact information for after-discharge care    Destination    [facility] .   Service: Skilled Nursing Contact information: [address] [place] [phone]                 No Known Allergies  Consultations:  Neuro, cardio    Procedures/Studies: CT HEAD WO CONTRAST  Result Date: 05/23/2019 CLINICAL DATA:  Follow-up stroke EXAM: CT HEAD WITHOUT CONTRAST TECHNIQUE: Contiguous axial images were obtained from the base of the skull through the vertex without intravenous contrast. COMPARISON:   MRI 05/21/2019.  CT 05/19/2019. FINDINGS: Brain: Discrete low-density now evident in the region of acute infarction within the left MCA territory affecting the deep insula and frontoparietal cortical and subcortical brain. Mild swelling but no evidence of mass effect or hemorrhage. No other acute or subacute infarction identified. Chronic small-vessel ischemic changes affect the cerebral hemispheric white matter elsewhere. Chronic small-vessel changes of the pons. No mass, hydrocephalus or extra-axial collection. Vascular: There is atherosclerotic calcification of the major vessels at the base of the brain. Skull: Negative Sinuses/Orbits: Clear/normal Other: None IMPRESSION: Discrete low-density now evident at CT in the region of acute infarction in the left MCA territory affecting the deep insula and frontoparietal brain. No mass effect or hemorrhagic transformation. Electronically Signed   By: Nelson Chimes M.D.   On: 05/23/2019 10:36   CT Head Wo Contrast  Result Date: 05/19/2019 CLINICAL DATA:  Encephalopathy EXAM: CT HEAD WITHOUT CONTRAST TECHNIQUE: Contiguous axial images were obtained from the base of the skull through the vertex without intravenous contrast. COMPARISON:  None FINDINGS: Brain: There is no acute intracranial hemorrhage, mass-effect, or edema. Gray-white differentiation is preserved. There is no extra-axial fluid collection. Patchy and confluent hypoattenuation in the supratentorial white matter is nonspecific but may reflect moderate chronic microvascular ischemic changes. Small age-indeterminate infarct of the lateral right cerebellum. Ventricles and sulci are within normal limits in size and configuration. Vascular: There is atherosclerotic calcification at the skull base. Skull: Calvarium is unremarkable. Sinuses/Orbits: Circumferential left sphenoid sinus mucosal thickening with occluded outflow and mild mucoperiosteal thickening. Orbits are unremarkable. Other: Mastoid air cells are  clear. IMPRESSION: No acute intracranial hemorrhage, mass effect, or evidence of acute infarction. Moderate chronic microvascular ischemic changes. Small age-indeterminate infarct of right cerebellum. 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 Electronically Signed   By: Macy Mis M.D.   On: 05/19/2019 12:45   MR BRAIN WO CONTRAST  Result Date: 05/21/2019 CLINICAL DATA:  Focal neuro deficit, greater than 6 hours, stroke suspected. EXAM: MRI HEAD WITHOUT CONTRAST TECHNIQUE: Multiplanar, multiecho pulse sequences of the brain and surrounding structures were obtained without intravenous contrast. COMPARISON:  Head CT 05/19/2019 FINDINGS: Brain: Mild-to-moderate motion degradation of multiple sequences. There is a moderate-sized acute cortical/subcortical infarct within the left MCA vascular territory measuring 5.1 x 3.3 x 4.4 cm (AP x TV x CC). The infarct involves the left insula, a small portion of the superior left temporal lobe and the left parietal lobe, including the postcentral  gyrus. Corresponding T2/FLAIR hyperintensity at this site. Small amount of petechial hemorrhage within the posterior left insula. There is a background of moderate chronic small vessel ischemic disease. Small chronic lacunar infarct within the left thalamus. No midline shift. No intracranial mass. No extra-axial fluid collection. Mild generalized parenchymal atrophy. Vascular: Flow voids maintained within the proximal large arterial vessels. Skull and upper cervical spine: No focal marrow lesion identified on motion degraded imaging. Sinuses/Orbits: Visualized orbits demonstrate no acute abnormality. Mild left sphenoid sinus mucosal thickening. No significant mastoid effusion. These results were called by telephone at the time of interpretation on 05/21/2019 at 1:09 pm to provider [facility] , who verbally acknowledged these results. IMPRESSION: 1. Motion degraded examination. 2. Moderate-sized acute cortical/subcortical infarct  within the left MCA vascular territory as detailed. Small amount of petechial hemorrhage within the posterior left insula. 3. Background of moderate chronic small vessel ischemic disease. Tiny chronic left thalamic lacunar infarct. Electronically Signed   By: Kellie Simmering DO   On: 05/21/2019 13:09   CARDIAC CATHETERIZATION  Result Date: 05/19/2019  There is moderate to severe left ventricular systolic dysfunction.  LV end diastolic pressure is mildly elevated.  Ost LAD to Prox LAD lesion is 30% stenosed.  Prox LAD to Mid LAD lesion is 30% stenosed.  Mid LAD to Dist LAD lesion is 60% stenosed.  Prox RCA to Mid RCA lesion is 100% stenosed.  RPDA lesion is 90% stenosed.  Post intervention, there is a 80% residual stenosis.  Balloon angioplasty was performed using a BALLOON TREK RX [id number].  1.  Codominant coronary arteries with severe one-vessel coronary artery disease.  The culprit is an occluded proximal right coronary artery which is diffusely diseased and heavily calcified.  Left to right collaterals were noted. 2.  Moderately to severely reduced LV systolic function with an EF of 30 to 35% with akinesis of the inferior wall. 3.  Mildly elevated left ventricular end-diastolic pressure at 21 mmHg. 4.  Unsuccessful balloon angioplasty of the right coronary artery given diffuse disease, calcifications and thrombus.  This was in spite of multiple balloon inflations throughout the whole vessel. Recommendations: Based on the patient's history and angiographic findings, I suspect that this is a late presentation inferior ST elevation myocardial infarction or acute on chronic presentation.  Given late presentation, underlying renal failure of unknown chronicity and the fact that the patient was chest pain-free, I elected not to prolong the procedure anymore and accepted results. Recommend medical therapy.  I was also concerned about the patient's continuous right arm movement in spite of instructed him not to  do so.   US RENAL  Result Date: 05/20/2019 CLINICAL DATA:  Renal insufficiency EXAM: RENAL / URINARY TRACT ULTRASOUND COMPLETE COMPARISON:  None. FINDINGS: Right Kidney: Renal measurements: 9.3 x 4.9 x 4.9 cm = volume: 104.3 mL. Increased cortical echogenicity. Left Kidney: Renal measurements: 9.6 x 6.1 x 5.5 cm = volume: 167.0 mL. Increased cortical echogenicity. Bladder: Appears normal for degree of bladder distention. Other: None. IMPRESSION: Increased cortical echogenicity bilaterally consistent with medical renal disease. No acute abnormalities. Electronically Signed   By: Dorise Bullion III M.D   On: 05/20/2019 10:53   US Carotid Bilateral  Result Date: 05/21/2019 CLINICAL DATA:  63 year old male with a history stroke EXAM: BILATERAL CAROTID DUPLEX ULTRASOUND TECHNIQUE: Pearline Cables scale imaging, color Doppler and duplex ultrasound were performed of bilateral carotid and vertebral arteries in the neck. COMPARISON:  None. FINDINGS: Criteria: Quantification of carotid stenosis is based on velocity parameters  that correlate the residual internal carotid diameter with NASCET-based stenosis levels, using the diameter of the distal internal carotid lumen as the denominator for stenosis measurement. The following velocity measurements were obtained: RIGHT ICA:  Systolic 80 cm/sec, Diastolic 25 cm/sec CCA:  86 cm/sec SYSTOLIC ICA/CCA RATIO:  0.9 ECA:  92 cm/sec LEFT ICA:  Systolic 74 cm/sec, Diastolic 21 cm/sec CCA:  123XX123 cm/sec SYSTOLIC ICA/CCA RATIO:  0.7 ECA:  123 cm/sec Right Brachial SBP: Not acquired Left Brachial SBP: Not acquired RIGHT CAROTID ARTERY: No significant calcifications of the right common carotid artery. Intermediate waveform maintained. Heterogeneous and partially calcified plaque at the right carotid bifurcation. No significant lumen shadowing. Low resistance waveform of the right ICA. No significant tortuosity. RIGHT VERTEBRAL ARTERY: Antegrade flow with low resistance waveform. LEFT CAROTID  ARTERY: No significant calcifications of the left common carotid artery. Intermediate waveform maintained. Heterogeneous and partially calcified plaque at the left carotid bifurcation without significant lumen shadowing. Low resistance waveform of the left ICA. No significant tortuosity. LEFT VERTEBRAL ARTERY: Antegrade flow. Late reversal of the systolic waveform with antegrade diastole. IMPRESSION: Color duplex indicates minimal heterogeneous and calcified plaque, with no hemodynamically significant stenosis by duplex criteria in the extracranial cerebrovascular circulation. Waveform of the left vertebral artery indicates developing proximal left subclavian artery stenosis. Correlation with bilateral upper extremity blood pressure is recommended. Signed, Dulcy Fanny. Dellia Nims, RPVI [facility] Radiology Electronically Signed   By: Corrie Mckusick D.O.   On: 05/21/2019 12:39   DG Chest Portable 1 View  Result Date: 05/19/2019 CLINICAL DATA:  Altered mental status EXAM: PORTABLE CHEST 1 VIEW COMPARISON:  None. FINDINGS: Mild atelectasis at the left lung base. No pleural effusion or pneumothorax. Heart size is normal. IMPRESSION: Mild atelectasis at the left lung base. Electronically Signed   By: Macy Mis M.D.   On: 05/19/2019 11:02   ECHOCARDIOGRAM COMPLETE  Result Date: 05/20/2019   ECHOCARDIOGRAM REPORT   Patient Name:   Arpan Hemmelgarn Date of Exam: 05/20/2019 Medical Rec #:  [id number]    Height:       68.0 in Accession #:    [id number]   Weight:       161.4 lb Date of Birth:  1955-12-22     BSA:          1.87 m Patient Age:    66 years     BP:           139/97 mmHg Patient Gender: M            HR:           68 bpm. Exam Location:  [facility] Procedure: 2D Echo, Color Doppler and Cardiac Doppler Indications:     Acute myocardial infarction 410  History:         Patient has no prior history of Echocardiogram examinations.                  Risk Factors:Hypertension.   Sonographer:     Charmayne Sheer RDCS (AE) Referring Phys:  52 [facility] A ARIDA Diagnosing Phys: Nelva Bush MD  Sonographer Comments: Suboptimal apical window. Image acquisition challenging due to respiratory motion. IMPRESSIONS  1. Left ventricular ejection fraction, by visual estimation, is 30 to 35%. The left ventricle has moderate to severely decreased function. There is no left ventricular hypertrophy.  2. Left ventricular diastolic parameters are consistent with Grade II diastolic dysfunction (pseudonormalization).  3. There is severe hypokinesis of the inferior and  inferoseptal walls.  4. There is septal "bounce" suggestive of increased intrapericardial pressure or constriction.  5. Global right ventricle has moderately reduced systolic function.The right ventricular size is mildly enlarged. No increase in right ventricular wall thickness.  6. Left atrial size was normal.  7. Right atrial size was mildly dilated.  8. Small pericardial effusion.  9. Mild to moderate mitral annular calcification. 10. The mitral valve is degenerative. Mild mitral valve regurgitation. No evidence of mitral stenosis. 11. The tricuspid valve is not well visualized. Tricuspid valve regurgitation mild-moderate. 12. The aortic valve is tricuspid. Aortic valve regurgitation is not visualized. Mild to moderate aortic valve sclerosis/calcification without any evidence of aortic stenosis. 13. The pulmonic valve was not well visualized. Pulmonic valve regurgitation is not visualized. 14. Mildly dilated pulmonary artery. 15. Mildly elevated pulmonary artery systolic pressure. 16. The inferior vena cava is dilated in size with <50% respiratory variability, suggesting right atrial pressure of 15 mmHg. FINDINGS  Left Ventricle: Left ventricular ejection fraction, by visual estimation, is 30 to 35%. The left ventricle has moderate to severely decreased function. The left ventricular internal cavity size was the left ventricle is normal in  size. There is no left ventricular hypertrophy. There is septal "bounce" suggestive of increased intrapericardial pressure or constriction. Left ventricular diastolic parameters are consistent with Grade II diastolic dysfunction (pseudonormalization). There is severe hypokinesis of the inferior and inferoseptal walls. Right Ventricle: The right ventricular size is mildly enlarged. No increase in right ventricular wall thickness. Global RV systolic function is has moderately reduced systolic function. The tricuspid regurgitant velocity is 2.28 m/s, and with an assumed right atrial pressure of 15 mmHg, the estimated right ventricular systolic pressure is mildly elevated at 35.8 mmHg. Left Atrium: Left atrial size was normal in size. Right Atrium: Right atrial size was mildly dilated Pericardium: A small pericardial effusion is present. Mitral Valve: The mitral valve is degenerative in appearance. There is mild thickening of the mitral valve leaflet(s). There is mild calcification of the mitral valve leaflet(s). Mild to moderate mitral annular calcification. No evidence of mitral valve stenosis by observation. MV peak gradient, 2.2 mmHg. Mild mitral valve regurgitation. Tricuspid Valve: The tricuspid valve is not well visualized. Tricuspid valve regurgitation mild-moderate. Aortic Valve: The aortic valve is tricuspid. Aortic valve regurgitation is not visualized. Mild to moderate aortic valve sclerosis/calcification is present, without any evidence of aortic stenosis. Aortic valve mean gradient measures 2.0 mmHg. Aortic valve peak gradient measures 4.4 mmHg. Aortic valve area, by VTI measures 1.55 cm. Pulmonic Valve: The pulmonic valve was not well visualized. Pulmonic valve regurgitation is not visualized. No evidence of pulmonic stenosis. Aorta: The aortic root is normal in size and structure. Pulmonary Artery: The pulmonary artery is mildly dilated. Venous: The inferior vena cava is dilated in size with less than  50% respiratory variability, suggesting right atrial pressure of 15 mmHg. IAS/Shunts: No atrial level shunt detected by color flow Doppler.  LEFT VENTRICLE PLAX 2D LVIDd:         4.60 cm  Diastology LVIDs:         4.18 cm  LV e' lateral:   7.83 cm/s LV PW:         0.95 cm  LV E/e' lateral: 8.3 LV IVS:        0.82 cm  LV e' medial:    5.22 cm/s LVOT diam:     2.10 cm  LV E/e' medial:  12.5 LV SV:  20 ml LV SV Index:   10.44 LVOT Area:     3.46 cm  RIGHT VENTRICLE RV Basal diam:  4.21 cm LEFT ATRIUM             Index       RIGHT ATRIUM           Index LA diam:        3.70 cm 1.98 cm/m  RA Area:     22.80 cm LA Vol (A2C):   43.6 ml 23.37 ml/m RA Volume:   74.70 ml  40.04 ml/m LA Vol (A4C):   52.8 ml 28.30 ml/m LA Biplane Vol: 48.3 ml 25.89 ml/m  AORTIC VALVE                   PULMONIC VALVE AV Area (Vmax):    1.78 cm    PV Vmax:       0.49 m/s AV Area (Vmean):   1.86 cm    PV Vmean:      32.700 cm/s AV Area (VTI):     1.55 cm    PV VTI:        0.078 m AV Vmax:           105.00 cm/s PV Peak grad:  1.0 mmHg AV Vmean:          70.500 cm/s PV Mean grad:  0.0 mmHg AV VTI:            0.201 m AV Peak Grad:      4.4 mmHg AV Mean Grad:      2.0 mmHg LVOT Vmax:         53.90 cm/s LVOT Vmean:        37.900 cm/s LVOT VTI:          0.090 m LVOT/AV VTI ratio: 0.45  AORTA Ao Root diam: 3.20 cm MITRAL VALVE                        TRICUSPID VALVE MV Area (PHT): 4.60 cm             TR Peak grad:   20.8 mmHg MV Peak grad:  2.2 mmHg             TR Vmax:        228.00 cm/s MV Mean grad:  1.0 mmHg MV Vmax:       0.75 m/s             SHUNTS MV Vmean:      47.6 cm/s            Systemic VTI:  0.09 m MV VTI:        0.22 m               Systemic Diam: 2.10 cm MV PHT:        47.85 msec MV Decel Time: 165 msec MV E velocity: 65.00 cm/s 103 cm/s MV A velocity: 55.80 cm/s 70.3 cm/s MV E/A ratio:  1.16       1.5  Harrell Gave End MD Electronically signed by Nelva Bush MD Signature Date/Time: 05/20/2019/1:17:18 PM    Final         Subjective: Pt c/o fatigue  Discharge Exam: Vitals:   06/03/19 0410 06/03/19 0757  BP: 131/76 (!) 140/97  Pulse: 79 (!) 101  Resp:    Temp: 98.2 F (36.8 C) 98 F (36.7 C)  SpO2: 92% 97%   Vitals:  06/02/19 2204 06/02/19 2208 06/03/19 0410 06/03/19 0757  BP: 128/78  131/76 (!) 140/97  Pulse: (!) 107 68 79 (!) 101  Resp: (!) 24     Temp:   98.2 F (36.8 C) 98 F (36.7 C)  TempSrc:   Oral Oral  SpO2: 98%  92% 97%  Weight:   75.1 kg   Height:        General: Pt is alert, awake, not in acute distress Cardiovascular: S1/S2 +, no rubs, no gallops Respiratory: decreased breath sounds b/l, no wheezing, no rhonchi Abdominal: Soft, NT, ND, bowel sounds + Extremities: no edema, no cyanosis    The results of significant diagnostics from this hospitalization (including imaging, microbiology, ancillary and laboratory) are listed below for reference.     Microbiology: Recent Results (from the past 240 hour(s))  SARS CORONAVIRUS 2 (TAT 6-24 HRS) Nasopharyngeal Nasopharyngeal Swab     Status: None   Collection Time: 06/02/19 12:54 PM   Specimen: Nasopharyngeal Swab  Result Value Ref Range Status   SARS Coronavirus 2 NEGATIVE NEGATIVE Final    Comment: (NOTE) SARS-CoV-2 target nucleic acids are NOT DETECTED. The SARS-CoV-2 RNA is generally detectable in upper and lower respiratory specimens during the acute phase of infection. Negative results do not preclude SARS-CoV-2 infection, do not rule out co-infections with other pathogens, and should not be used as the sole basis for treatment or other patient management decisions. Negative results must be combined with clinical observations, patient history, and epidemiological information. The expected result is Negative. Fact Sheet for Patients: SugarRoll.be Fact Sheet for Healthcare Providers: https://www.woods-mathews.com/ This test is not yet approved or cleared by the [place] FDA and  has been authorized for detection and/or diagnosis of SARS-CoV-2 by FDA under an Emergency Use Authorization (EUA). This EUA will remain  in effect (meaning this test can be used) for the duration of the COVID-19 declaration under Section 56 4(b)(1) of the Act, 21 U.S.C. section 360bbb-3(b)(1), unless the authorization is terminated or revoked sooner. Performed at [facility], [place] [address] [place], [place] [address]      Labs: BNP (last 3 results) No results for input(s): BNP in the last 8760 hours. Basic Metabolic Panel: Recent Labs  Lab 05/30/19 0506 05/31/19 0642 06/01/19 0623 06/02/19 0447 06/03/19 0512  NA 138 138 133* 137 137  K 3.8 4.1 4.0 3.9 3.7  CL 108 105 102 104 106  CO2 20* 22 22 21* 19*  GLUCOSE 101* 110* 105* 107* 95  BUN 35* 39* 42* 42* 48*  CREATININE 1.75* 1.94* 2.14* 2.24* 1.89*  CALCIUM 8.4* 8.5* 8.4* 8.5* 8.3*   Liver Function Tests: No results for input(s): AST, ALT, ALKPHOS, BILITOT, PROT, ALBUMIN in the last 168 hours. No results for input(s): LIPASE, AMYLASE in the last 168 hours. No results for input(s): AMMONIA in the last 168 hours. CBC: Recent Labs  Lab 05/30/19 0506 05/31/19 0642 06/01/19 0623 06/02/19 0447 06/03/19 0512  WBC 9.8 8.4 9.4 8.7 7.9  HGB 10.9* 10.9* 11.2* 11.0* 10.5*  HCT 31.7* 33.0* 33.6* 34.0* 32.2*  MCV 87.8 88.7 87.7 90.9 91.5  PLT 241 219 241 222 220   Cardiac Enzymes: No results for input(s): CKTOTAL, CKMB, CKMBINDEX, TROPONINI in the last 168 hours. BNP: Invalid input(s): POCBNP CBG: No results for input(s): GLUCAP in the last 168 hours. D-Dimer No results for input(s): DDIMER in the last 72 hours. Hgb A1c No results for input(s): HGBA1C in the last 72 hours. Lipid Profile No  results for input(s): CHOL, HDL, LDLCALC, TRIG, CHOLHDL, LDLDIRECT in the last 72 hours. Thyroid function studies No results for input(s): TSH, T4TOTAL, T3FREE, THYROIDAB in the last 72 hours.  Invalid  input(s): FREET3 Anemia work up No results for input(s): VITAMINB12, FOLATE, FERRITIN, TIBC, IRON, RETICCTPCT in the last 72 hours. Urinalysis    Component Value Date/Time   COLORURINE YELLOW (A) 05/19/2019 1059   APPEARANCEUR CLEAR (A) 05/19/2019 1059   LABSPEC 1.012 05/19/2019 1059   PHURINE 5.0 05/19/2019 1059   GLUCOSEU NEGATIVE 05/19/2019 1059   HGBUR NEGATIVE 05/19/2019 1059   BILIRUBINUR NEGATIVE 05/19/2019 1059   KETONESUR NEGATIVE 05/19/2019 1059   PROTEINUR NEGATIVE 05/19/2019 1059   NITRITE NEGATIVE 05/19/2019 1059   LEUKOCYTESUR NEGATIVE 05/19/2019 1059   Sepsis Labs Invalid input(s): PROCALCITONIN,  WBC,  LACTICIDVEN Microbiology Recent Results (from the past 240 hour(s))  SARS CORONAVIRUS 2 (TAT 6-24 HRS) Nasopharyngeal Nasopharyngeal Swab     Status: None   Collection Time: 06/02/19 12:54 PM   Specimen: Nasopharyngeal Swab  Result Value Ref Range Status   SARS Coronavirus 2 NEGATIVE NEGATIVE Final    Comment: (NOTE) SARS-CoV-2 target nucleic acids are NOT DETECTED. The SARS-CoV-2 RNA is generally detectable in upper and lower respiratory specimens during the acute phase of infection. Negative results do not preclude SARS-CoV-2 infection, do not rule out co-infections with other pathogens, and should not be used as the sole basis for treatment or other patient management decisions. Negative results must be combined with clinical observations, patient history, and epidemiological information. The expected result is Negative. Fact Sheet for Patients: SugarRoll.be Fact Sheet for Healthcare Providers: https://www.woods-mathews.com/ This test is not yet approved or cleared by the [place] FDA and  has been authorized for detection and/or diagnosis of SARS-CoV-2 by FDA under an Emergency Use Authorization (EUA). This EUA will remain  in effect (meaning this test can be used) for the duration of the COVID-19 declaration  under Section 56 4(b)(1) of the Act, 21 U.S.C. section 360bbb-3(b)(1), unless the authorization is terminated or revoked sooner. Performed at [facility], [place] [address], [place], [place] [address]      Time coordinating discharge: Over 30 minutes  SIGNED:   Wyvonnia Dusky, MD  [facility] 06/03/2019, 12:07 PM Pager   If 7PM-7AM, please contact night-coverage [email or web address] Password [email or web address]

## 2019-06-03 NOTE — Telephone Encounter (Signed)
Pt is still admitted as of 06/03/19 will reassess his chart for D/C TCM call 06/04/19.

## 2019-06-04 NOTE — Telephone Encounter (Signed)
Called patient for TCM call and no answer-unable to leave message.

## 2019-06-05 NOTE — Telephone Encounter (Signed)
2nd attempt TCM Attempted to call the patient. No answer- I left a message to please call back.

## 2019-06-09 ENCOUNTER — Ambulatory Visit: Payer: Self-pay | Admitting: Family

## 2019-06-09 NOTE — Progress Notes (Deleted)
Office Visit    Patient Name: Gordon [name] Date of Encounter: 06/09/2019  Primary Care Provider:  System, Pcp Not In Primary Cardiologist:  Kathlyn Sacramento, MD Electrophysiologist:  None   Chief Complaint    Gordon [name] is a 63 y.o. male with a hx of HTN, tobacco abuse, atrial fibrillation, CVA, CAD s/p STEMI and balloon angioplasty to RCA, HFrEF, HLD, CKDIIIb  presents today for transitions of care visit after recent hospitalization.  Past Medical History    Past Medical History:  Diagnosis Date  . CAD (coronary artery disease)   . CKD (chronic kidney disease)   . Current smoker   . Diabetes mellitus ([place])    A1C 6.1 (05/2019)  . HFrEF (heart failure with reduced ejection fraction) (HCC)    EF 30-35%  . Hypertension   . Inferior MI ([place])    05/2019  . Paroxysmal atrial fibrillation ([place])    05/2019 with CVA at admission. CHADS2VASc 5 (CHF, DM2, HTN, strokex2). [place] with Eliquis.  . Stroke (cerebrum) ([place])    05/2019   Past Surgical History:  Procedure Laterality Date  . CORONARY BALLOON ANGIOPLASTY N/A 05/19/2019   Procedure: CORONARY BALLOON ANGIOPLASTY;  Surgeon: Wellington Hampshire, MD;  Location: [facility];  Service: Cardiovascular;  Laterality: N/A;  . CORONARY/GRAFT ACUTE MI REVASCULARIZATION N/A 05/19/2019   Procedure: Coronary/Graft Acute MI Revascularization;  Surgeon: Wellington Hampshire, MD;  Location: [facility];  Service: Cardiovascular;  Laterality: N/A;  . LEFT HEART CATH AND CORONARY ANGIOGRAPHY N/A 05/19/2019   Procedure: LEFT HEART CATH AND CORONARY ANGIOGRAPHY;  Surgeon: Wellington Hampshire, MD;  Location: [facility];  Service: Cardiovascular;  Laterality: N/A;    Allergies  No Known Allergies  History of Present Illness    Gordon [name] is a 63 y.o. male with a hx of HTN, tobacco abuse, atrial fibrillation, CVA, CAD s/p STEMI and balloon angioplasty to RCA, HFrEF, HLD, CKDIIIb  last seen while hospitalized.  He was  admitted to [facility] 05/19/2019. He had intermittent chest pain for 1 week. In ED he was noted to be in atrial fib with RVR, hypertensive, and confused. Initial EKG with inferior ST elevation but code STEMI not called due to concerns about mental status and atrial flutter.  When evaluated Dr. Mariea Clonts in the ED inferior ST elevation still persistent, code STEMI activated. CT scan prior with no evidence of bleeding. Emergent cardiac cath with occluded codominant RCA with left-to-right collaterals.  Noted heavily calcified with diffuse disease and thrombus.  Balloon angioplasty result with TIMI II flow to RCA. Echo 05/20/19 with LVEF 30-35%, grIIDD, severe hypokinesis inferior and inferoseptal walls, septal "bounce", RV moderated reduced function and mildly enlarged, mild MR, mildly dilated pulmonary artery. Carotid duplex 05/21/19 with minimal heterogenous and calcified plaque without significant stenosis. MRI 05/21/19 with acute left MCA territory with small petechial hemorrhage. Repeat CT scan without hemorrhage. Uptitration of heart failure therapies limited by kidney dysfunction during hospitalization. He was discharged to [facility] 06/03/19.   ***  EKGs/Labs/Other Studies Reviewed:   The following studies were reviewed today:  LHC 05/19/2019: There is moderate to severe left ventricular systolic dysfunction.  LV end diastolic pressure is mildly elevated.  Ost LAD to Prox LAD lesion is 30% stenosed.  Prox LAD to Mid LAD lesion is 30% stenosed.  Mid LAD to Dist LAD lesion is 60% stenosed.  Prox RCA to Mid RCA lesion is 100% stenosed.  RPDA lesion is 90% stenosed.  Post intervention, there is a 80% residual stenosis.  Balloon angioplasty was performed using a BALLOON TREK RX [id number].   1.  Codominant coronary arteries with severe one-vessel coronary artery disease.  The culprit is an occluded proximal right coronary artery which is diffusely diseased and heavily calcified.  Left to right  collaterals were noted. 2.  Moderately to severely reduced LV systolic function with an EF of 30 to 35% with akinesis of the inferior wall. 3.  Mildly elevated left ventricular end-diastolic pressure at 21 mmHg. 4.  Unsuccessful balloon angioplasty of the right coronary artery given diffuse disease, calcifications and thrombus.  This was in spite of multiple balloon inflations throughout the whole vessel.   Recommendations: Based on the patient's history and angiographic findings, I suspect that this is a late presentation inferior ST elevation myocardial infarction or acute on chronic presentation.  Given late presentation, underlying renal failure of unknown chronicity and the fact that the patient was chest pain-free, I elected not to prolong the procedure anymore and accepted results. Recommend medical therapy.  I was also concerned about the patient's continuous right arm movement in spite of instructed him not to do so. __________   2D Echo 05/20/2019: 1. Left ventricular ejection fraction, by visual estimation, is 30 to 35%. The left ventricle has moderate to severely decreased function. There is no left ventricular hypertrophy.  2. Left ventricular diastolic parameters are consistent with Grade II diastolic dysfunction (pseudonormalization).  3. There is severe hypokinesis of the inferior and inferoseptal walls.  4. There is septal "bounce" suggestive of increased intrapericardial pressure or constriction.  5. Global right ventricle has moderately reduced systolic function.The right ventricular size is mildly enlarged. No increase in right ventricular wall thickness.  6. Left atrial size was normal.  7. Right atrial size was mildly dilated.  8. Small pericardial effusion.  9. Mild to moderate mitral annular calcification. 10. The mitral valve is degenerative. Mild mitral valve regurgitation. No evidence of mitral stenosis. 11. The tricuspid valve is not well visualized. Tricuspid valve  regurgitation mild-moderate. 12. The aortic valve is tricuspid. Aortic valve regurgitation is not visualized. Mild to moderate aortic valve sclerosis/calcification without any evidence of aortic stenosis. 13. The pulmonic valve was not well visualized. Pulmonic valve regurgitation is not visualized. 14. Mildly dilated pulmonary artery. 15. Mildly elevated pulmonary artery systolic pressure. 16. The inferior vena cava is dilated in size with <50% respiratory variability, suggesting right atrial pressure of 15 mmHg.  EKG:  EKG is ordered today.  The ekg ordered today demonstrates ***  Recent Labs: 05/20/2019: ALT 18 06/03/2019: BUN 48; Creatinine, Ser 1.89; Hemoglobin 10.5; Platelets 220; Potassium 3.7; Sodium 137  Recent Lipid Panel    Component Value Date/Time   CHOL 108 05/20/2019 0408   TRIG 54 05/20/2019 0408   HDL 22 (L) 05/20/2019 0408   CHOLHDL 4.9 05/20/2019 0408   VLDL 11 05/20/2019 0408   LDLCALC 75 05/20/2019 0408   Home Medications   No outpatient medications have been marked as taking for the 06/09/19 encounter (Appointment) with Loel Dubonnet, NP.    Review of Systems   ***   ROS All other systems reviewed and are otherwise negative except as noted above.  Physical Exam    VS:  There were no vitals taken for this visit. , BMI There is no height or weight on file to calculate BMI. GEN: Well nourished, well developed, in no acute distress. HEENT: normal. Neck: Supple, no JVD, carotid bruits, or masses.  Cardiac: ***RRR, no murmurs, rubs, or gallops. No clubbing, cyanosis, edema.  ***Radials/DP/PT 2+ and equal bilaterally.  Respiratory:  ***Respirations regular and unlabored, clear to auscultation bilaterally. GI: Soft, nontender, nondistended, BS + x 4. MS: No deformity or atrophy. Skin: Warm and dry, no rash. Neuro:  Strength and sensation are intact. Psych: Normal affect.  Accessory Clinical Findings    ECG personally reviewed by me today - *** - no acute  changes.  Assessment & Plan    1. CAD - s/p 05/19/19 STEMI and balloon angioplasty RCA.  2. HTN -  3. HLD, LDL goal <70 - Lipid profile 05/20/19 LDL 75. Started on statin during recent admission. *** 4. HFrEF/ischemic cardiomyopathy - Echo 05/20/19 LVEF 30-35%, severe hypokinesis of inferior and inferior septal wall, R atrial pressure 15. ***Heart failure therapies limited by renal failure. 5. PAF -  6. On amiodarone therapy - 05/20/19 AST 22, ALT 18. *** 7. Long term current use of anticoagulation - Secondary to PAF.  8. CVA -  9. CKD IIIb -   Disposition: Follow up {follow up:15908} with ***   Loel Dubonnet, NP 06/09/2019, 9:16 AM

## 2019-06-10 ENCOUNTER — Ambulatory Visit: Payer: Self-pay | Admitting: Family

## 2019-06-10 NOTE — Telephone Encounter (Signed)
This patient lives in a facility. Calls need to be monitored for this because if in facility they may or may not have appointment information even if given on discharge paperwork. Facility called yesterday and they rescheduled for today. Patient did not show up for his appointment today. Called over to facility and left voicemail message for them to please call back regarding his need to see provider for follow up.

## 2019-06-11 ENCOUNTER — Telehealth: Payer: Self-pay | Admitting: Family

## 2019-06-11 NOTE — Telephone Encounter (Signed)
Spoke with Gordon [name] at facility and reported that patient had appointment scheduled after discharge for this past Monday which he missed. Called on Monday and rescheduled patient for Tuesday. Explained importance for him to keep appointments given his recent hospital stay and diagnosis. He verbalized understanding and took our recall information to have someone check on this. I did forget to mention that we could do virtual if needed. Will send to scheduling when they return call to reschedule.

## 2019-06-11 NOTE — Telephone Encounter (Signed)
[facility] called back stating that they would have transportation call us back to schedule repeat appointment.

## 2019-06-12 ENCOUNTER — Ambulatory Visit (INDEPENDENT_AMBULATORY_CARE_PROVIDER_SITE_OTHER): Payer: Self-pay | Admitting: Nurse Practitioner

## 2019-06-12 ENCOUNTER — Other Ambulatory Visit: Payer: Self-pay

## 2019-06-12 ENCOUNTER — Other Ambulatory Visit
Admission: RE | Admit: 2019-06-12 | Discharge: 2019-06-12 | Disposition: A | Discharge status: Home / Self Care | Payer: Medicaid Other | Source: Ambulatory Visit | Attending: Nurse Practitioner | Admitting: Nurse Practitioner

## 2019-06-12 ENCOUNTER — Encounter: Payer: Self-pay | Admitting: Nurse Practitioner

## 2019-06-12 VITALS — BP 124/80 | HR 49 | Ht 66.0 in | Wt 174.0 lb

## 2019-06-12 DIAGNOSIS — I480 Paroxysmal atrial fibrillation: Secondary | ICD-10-CM | POA: Insufficient documentation

## 2019-06-12 DIAGNOSIS — I2119 ST elevation (STEMI) myocardial infarction involving other coronary artery of inferior wall: Secondary | ICD-10-CM

## 2019-06-12 DIAGNOSIS — E785 Hyperlipidemia, unspecified: Secondary | ICD-10-CM

## 2019-06-12 DIAGNOSIS — I5020 Unspecified systolic (congestive) heart failure: Secondary | ICD-10-CM | POA: Diagnosis present

## 2019-06-12 DIAGNOSIS — I2510 Atherosclerotic heart disease of native coronary artery without angina pectoris: Secondary | ICD-10-CM

## 2019-06-12 DIAGNOSIS — I255 Ischemic cardiomyopathy: Secondary | ICD-10-CM

## 2019-06-12 DIAGNOSIS — I10 Essential (primary) hypertension: Secondary | ICD-10-CM

## 2019-06-12 LAB — BASIC METABOLIC PANEL
Anion gap: 11 (ref 5–15)
BUN: 31 mg/dL — ABNORMAL HIGH (ref 8–23)
CO2: 19 mmol/L — ABNORMAL LOW (ref 22–32)
Calcium: 8.7 mg/dL — ABNORMAL LOW (ref 8.9–10.3)
Chloride: 109 mmol/L (ref 98–111)
Creatinine, Ser: 2.24 mg/dL — ABNORMAL HIGH (ref 0.61–1.24)
GFR calc Af Amer: 35 mL/min — ABNORMAL LOW (ref >60)
GFR calc non Af Amer: 30 mL/min — ABNORMAL LOW (ref >60)
Glucose, Bld: 107 mg/dL — ABNORMAL HIGH (ref 70–99)
Potassium: 3.9 mmol/L (ref 3.5–5.1)
Sodium: 139 mmol/L (ref 135–145)

## 2019-06-12 MED ORDER — METOPROLOL SUCCINATE ER 50 MG PO TB24: 50.0000 mg | ORAL_TABLET | Freq: Every day | ORAL | Status: DC

## 2019-06-12 MED ORDER — POTASSIUM CHLORIDE CRYS ER 20 MEQ PO TBCR: 20.0000 meq | EXTENDED_RELEASE_TABLET | Freq: Every day | ORAL | 5 refills | Status: DC

## 2019-06-12 MED ORDER — FUROSEMIDE 40 MG PO TABS: 40.0000 mg | ORAL_TABLET | Freq: Every day | ORAL | 5 refills | Status: DC

## 2019-06-12 MED ORDER — AMIODARONE HCL 200 MG PO TABS: 200.0000 mg | ORAL_TABLET | Freq: Every day | ORAL | 0 refills | Status: DC

## 2019-06-12 NOTE — Progress Notes (Signed)
Office Visit    Patient Name: Gordon [name] Date of Encounter: 06/12/2019  Primary Care Provider:  Jodi Marble, MD Primary Cardiologist:  Kathlyn Sacramento, MD  Chief Complaint    63 year old male with a history of hypertension and tobacco abuse, who presents for follow-up after recent admission for inferior STEMI with unsuccessful PCI to the occluded right coronary, ischemic cardiomyopathy, acute on chronic combined heart failure with an EF of 30 to 35%, paroxysmal atrial fibrillation, acute kidney injury in the setting of stage III chronic kidney disease, and left MCA territory stroke.  Past Medical History    Past Medical History:  Diagnosis Date  . CAD (coronary artery disease)    a. 05/2019 Inf STEMI/Cath: LM nl, LAD 30ost/p/m, 4m, LCX mild diff dzs, RCA 100p/m (unsuccessful PCI), RPDA 90 (filled via L->R collats). EF: 30%.  . Chronic combined systolic (congestive) and diastolic (congestive) heart failure ([place])    a. 05/2019 Echo: EF 30-35%, Gr2 DD, mod red RV fxn, mild RAE, Mild MR, mild to mod TR, mild to mod Ao sclerosis, mildly elev PASP.  Marland Kitchen CKD (chronic kidney disease), stage III   . Current smoker   . Diabetes mellitus ([place])    A1C 6.1 (05/2019)  . Hyperlipidemia LDL goal <70   . Hypertension   . Inferior MI ([place])    05/2019  . Ischemic cardiomyopathy    a. 05/2019 Echo: EF 30-35%.  . Paroxysmal atrial fibrillation ([place])    a. 05/2019 with CVA at admission. CHADS2VASc 5 (CHF, DM2, HTN, strokex2)-->Eliquis.  . Stroke (cerebrum) ([place])    a. 05/2019 MRI: mod-sized acute cortical/subcortical infarct w/in L MCA territory. Sm amt of petechial hemorrhage w/in posterior L insula.   Past Surgical History:  Procedure Laterality Date  . CARDIAC CATHETERIZATION    . CORONARY BALLOON ANGIOPLASTY N/A 05/19/2019   Procedure: CORONARY BALLOON ANGIOPLASTY;  Surgeon: Wellington Hampshire, MD;  Location: [facility];  Service: Cardiovascular;  Laterality: N/A;  .  CORONARY/GRAFT ACUTE MI REVASCULARIZATION N/A 05/19/2019   Procedure: Coronary/Graft Acute MI Revascularization;  Surgeon: Wellington Hampshire, MD;  Location: [facility];  Service: Cardiovascular;  Laterality: N/A;  . LEFT HEART CATH AND CORONARY ANGIOGRAPHY N/A 05/19/2019   Procedure: LEFT HEART CATH AND CORONARY ANGIOGRAPHY;  Surgeon: Wellington Hampshire, MD;  Location: [facility];  Service: Cardiovascular;  Laterality: N/A;    Allergies  No Known Allergies  History of Present Illness    63 year old male with the above complex past medical history including hypertension and tobacco abuse.  He was admitted to [facility] on December 7 after presenting with intermittent substernal chest discomfort that became more severe that morning.  Upon EMS arrival, he was found to be in A. fib with RVR.  ECG showed inferior ST segment elevation.  Altered mental status was also noted.  A code STEMI was not immediately activated.  Following arrival in the ED, troponin was elevated at 3500.  Repeat ECG continued showed inferior ST segment elevation and code STEMI was activated.  Given confusion, prior to proceeding to the Cath Lab, a head CT was performed and this did not show any evidence of bleeding.  Diagnostic catheterization revealed an occluded right coronary artery.  PCI was attempted but was unsuccessful due to the rigidity of the artery and therefore, medical therapy was recommended.  EF was 30-35% with mildly elevated LVEDP.  He was noted to have renal insufficiency post cath.  2 days after  catheterization, he was having difficulty with word finding and fine motor movements.  Neurology was consulted.  MRI showed a moderate size acute cortical/subcortical infarct within the left MCA territory with a small amount of hemorrhage within the posterior left insula.  In the setting of A. fib on presentation, infarcts felt to be embolic.  He was initially maintained on aspirin and Plavix in the  setting of his myocardial infarction however, in the setting of A. fib, repeat CT on December 11 did not show any hemorrhagic conversion and his aspirin was discontinued in favor of Eliquis therapy.  A. fib was managed with amiodarone therapy.  He was seen by PT and OT and it was determined that he would require skilled nursing facility placement.  Unfortunately, this was a prolonged process and he was not discharged until December 22.  Since his discharge, he says he has been feeling well though he has noted increasing lower extremity swelling.  His weight is 174 pounds today which is roughly 9 pounds above his discharge weight.  Despite lower extremity swelling, he denies chest pain, dyspnea, palpitations, PND, orthopnea, dizziness, syncope, or early satiety.  Home Medications    Prior to Admission medications   Medication Sig Start Date End Date Taking? Authorizing Provider  acetaminophen (TYLENOL) 325 MG tablet Take 2 tablets (650 mg total) by mouth every 6 (six) hours as needed for mild pain or headache. 06/03/19 07/03/19  Wyvonnia Dusky, MD  amiodarone (PACERONE) 200 MG tablet Take 1 tablet (200 mg total) by mouth 2 (two) times daily. 06/03/19 07/03/19  Wyvonnia Dusky, MD  amiodarone (PACERONE) 400 MG tablet Take 1 tablet (400 mg total) by mouth 2 (two) times daily for 1 day. 06/03/19 06/04/19  Wyvonnia Dusky, MD  apixaban (ELIQUIS) 5 MG TABS tablet Take 1 tablet (5 mg total) by mouth 2 (two) times daily. 06/03/19 07/03/19  Wyvonnia Dusky, MD  atorvastatin (LIPITOR) 40 MG tablet Take 1 tablet (40 mg total) by mouth daily at 6 PM. 06/03/19 07/03/19  Wyvonnia Dusky, MD  clopidogrel (PLAVIX) 75 MG tablet Take 1 tablet (75 mg total) by mouth daily. 06/04/19 07/04/19  Wyvonnia Dusky, MD  hydrALAZINE (APRESOLINE) 25 MG tablet Take 1 tablet (25 mg total) by mouth 3 (three) times daily. 06/03/19 07/03/19  Wyvonnia Dusky, MD  isosorbide mononitrate (IMDUR) 60 MG 24 hr tablet  Take 1 tablet (60 mg total) by mouth daily. 06/04/19 07/04/19  Wyvonnia Dusky, MD  metoprolol succinate (TOPROL-XL) 100 MG 24 hr tablet Take 1 tablet (100 mg total) by mouth daily. Take with or immediately following a meal. 06/04/19   Wyvonnia Dusky, MD    Review of Systems    Increasing lower extremity edema since discharge.  He denies chest pain, palpitations, dyspnea, PND, orthopnea, dizziness, syncope, or early satiety.  All other systems reviewed and are otherwise negative except as noted above.  Physical Exam    VS:  BP 124/80 (BP Location: Left Arm, Patient Position: Sitting, Cuff Size: Normal)   Pulse (!) 49   Ht 5\' 6"  (1.676 m)   Wt 174 lb (78.9 kg)   BMI 28.08 kg/m  , BMI Body mass index is 28.08 kg/m. GEN: Well nourished, well developed, in no acute distress. HEENT: normal. Neck: Supple, no JVD, carotid bruits, or masses. Cardiac: RRR, bradycardic, 2/6 systolic murmur throughout, no rubs, or gallops. No clubbing, cyanosis.  2+ bilateral lower extremity edema to the thighs.  Radials/PT 2+ and equal  bilaterally.  Respiratory:  Respirations regular and unlabored, clear to auscultation bilaterally. GI: Obese, soft, nontender, nondistended, BS + x 4.  Trace bilateral flank edema noted. MS: no deformity or atrophy. Skin: warm and dry, no rash. Neuro:  Strength and sensation are intact. Psych: Normal affect.  Accessory Clinical Findings    ECG personally reviewed by me today -sinus bradycardia, 49, first-degree AV block, right axis deviation, inferior infarct with inferior and anterolateral T wave inversion.  Lab Results  Component Value Date   WBC 7.9 06/03/2019   HGB 10.5 (L) 06/03/2019   HCT 32.2 (L) 06/03/2019   MCV 91.5 06/03/2019   PLT 220 06/03/2019   Lab Results  Component Value Date   CREATININE 2.24 (H) 06/12/2019   BUN 31 (H) 06/12/2019   NA 139 06/12/2019   K 3.9 06/12/2019   CL 109 06/12/2019   CO2 19 (L) 06/12/2019   Lab Results  Component  Value Date   ALT 18 05/20/2019   AST 22 05/20/2019   ALKPHOS 67 05/20/2019   BILITOT 1.0 05/20/2019   Lab Results  Component Value Date   CHOL 108 05/20/2019   HDL 22 (L) 05/20/2019   LDLCALC 75 05/20/2019   TRIG 54 05/20/2019   CHOLHDL 4.9 05/20/2019    Lab Results  Component Value Date   HGBA1C 6.1 (H) 05/21/2019    Assessment & Plan    1.  Inferior STEMI, subsequent episode of care/coronary artery disease: Status post recent admission for inferior STEMI with finding of an occluded RCA.  PCI was attempted but was unsuccessful due to vessel rigidity and he has been medically managed.  He has not had any recurrent chest pain.  He remains on Plavix, beta-blocker, nitrate, and statin therapy.  No aspirin in the setting of need for Eliquis.  In the setting of sinus bradycardia, I am reducing his Toprol to 50 mg daily.  He is currently at skilled nursing for rehab.  2.  Acute on chronic systolic congestive heart failure/ischemic cardiomyopathy: In the setting of recent MI, EF 30 to 35%.  He did not have any significant volume excess during hospitalization but is now up 9 pounds with significant bilateral lower extremity edema.  He has not been experiencing any dyspnea.  I have checked the basic metabolic panel today and his creatinine is slightly elevated above discharge though within range of where he was trending during hospitalization.  I am adding Lasix 40 mg daily we will plan to see him back in 1 week, at which time he will need a repeat basic metabolic panel.  Continue beta-blocker, hydralazine, and nitrate.  I am reducing beta-blocker in the setting of bradycardia.  No ACE/ARB/ARN I/MRA secondary to creatinine of 2.2.  3.  Essential hypertension: Stable on beta-blocker, hydralazine, and nitrate.  Adding diuretic as above.  4.  Hyperlipidemia: LDL 75 in December.  Continue statin therapy with plan for follow-up in about 4 to 6 weeks.  5.  Paroxysmal atrial fibrillation: A. fib with  RVR was present at the time of presentation during MI.  He is rhythm managed on amiodarone.  Heart rate 49 today.  I am going to reduce his amiodarone to 200 mg daily as previously planned and also reduce metoprolol to 50 mg daily due to her sinus bradycardia.  He remains on Eliquis therapy.  Plan to follow-up CBC in about a month and can likely do this when we follow-up his lipids.  6.  Left MCA territory stroke: Likely cardioembolic  in the setting of A. fib.  Still with some word searching but overall stable and undergoing PT and OT at skilled nursing facility.  Continue statin and Eliquis.  7.  Stage III chronic kidney disease: Creatinine relatively stable in the setting of volume overload today.  We will have to follow-up again next week as I have added Lasix.  8.  Disposition: Follow-up in clinic in 1 week with plan for basic metabolic panel at that time.   Murray Hodgkins, NP 06/12/2019, 12:43 PM

## 2019-06-12 NOTE — Patient Instructions (Signed)
Medication Instructions:  Decrease Amiodarone to 200mg  daily. Decrease Toprol XL to 50mg  daily. Start Potassium Chloride 20 meq daily Start Lasix 40mg  daily.  *If you need a refill on your cardiac medications before your next appointment, please call your pharmacy*  Lab Work:  BMET today in the hospital lab. If you have labs (blood work) drawn today and your tests are completely normal, you will receive your results only by: Marland Kitchen MyChart Message (if you have MyChart) OR . A paper copy in the mail If you have any lab test that is abnormal or we need to change your treatment, we will call you to review the results.  Testing/Procedures: None ordered.  Follow-Up: At [facility], you and your health needs are our priority.  As part of our continuing mission to provide you with exceptional heart care, we have created designated Provider Care Teams.  These Care Teams include your primary Cardiologist (physician) and Advanced Practice Providers (APPs -  Physician Assistants and Nurse Practitioners) who all work together to provide you with the care you need, when you need it.  Your next appointment:   1 week(s)  The format for your next appointment:   In Person  Provider:    You may see Kathlyn Sacramento, MD or one of the following Advanced Practice Providers on your designated Care Team:    Murray Hodgkins, NP  Christell Faith, PA-C  Marrianne Mood, PA-C   Other Instructions NA

## 2019-06-16 ENCOUNTER — Telehealth: Payer: Self-pay

## 2019-06-16 NOTE — Telephone Encounter (Signed)
Call attempted. RN not available to take call at this time.

## 2019-06-16 NOTE — Telephone Encounter (Signed)
-----   Message from Theora Gianotti, NP sent at 06/12/2019 12:00 PM EST ----- Kidney fxn remains mildly abnl.  We'll plan to follow this up when he is seen next week since we've added lasix today.

## 2019-06-17 NOTE — Telephone Encounter (Signed)
Call to review results. No answer. left detailed message on RN line. left detailed message on RN line.  No further orders at this time. Also left information on appt later this week.   Advised pt to call for any further questions or concerns.

## 2019-06-19 NOTE — Telephone Encounter (Signed)
Ive called the facility x 2. Lmov to reschedule.

## 2019-06-19 NOTE — Telephone Encounter (Signed)
Spoke with [place] at [facility] facility. She is going to reach out to the lady that handles these appointments and transportation. She will give her my name and number to call back and either reschedule appointment or switch it to virtual visit with patient.

## 2019-06-19 NOTE — Telephone Encounter (Signed)
Services: Skilled Nursing Address: [address], [place] [address] Phone: [phone]  Left voicemail message for them to please call back regarding appointment.

## 2019-06-19 NOTE — Telephone Encounter (Signed)
Spoke with Tammy and they are currently super busy administering COVID vaccines to patients today. She will call back before 5 pm to discuss switching patient to virtual visit.

## 2019-06-19 NOTE — Telephone Encounter (Signed)
This patient resides with [facility]. So for his appointment tomorrow we may need to call them directly verses the patients number about his appointment and if we need to switch to virtual. Let me know if I can assist with this at all.

## 2019-06-20 ENCOUNTER — Telehealth (INDEPENDENT_AMBULATORY_CARE_PROVIDER_SITE_OTHER): Payer: Self-pay | Admitting: Family

## 2019-06-20 ENCOUNTER — Other Ambulatory Visit: Payer: Self-pay

## 2019-06-20 ENCOUNTER — Encounter: Payer: Self-pay | Admitting: Family

## 2019-06-20 VITALS — BP 138/72 | HR 68 | Ht 66.0 in | Wt 168.2 lb

## 2019-06-20 DIAGNOSIS — I480 Paroxysmal atrial fibrillation: Secondary | ICD-10-CM

## 2019-06-20 DIAGNOSIS — I255 Ischemic cardiomyopathy: Secondary | ICD-10-CM

## 2019-06-20 DIAGNOSIS — I5020 Unspecified systolic (congestive) heart failure: Secondary | ICD-10-CM

## 2019-06-20 DIAGNOSIS — I10 Essential (primary) hypertension: Secondary | ICD-10-CM

## 2019-06-20 DIAGNOSIS — I2510 Atherosclerotic heart disease of native coronary artery without angina pectoris: Secondary | ICD-10-CM

## 2019-06-20 NOTE — Progress Notes (Signed)
Called and spoke with [place] at [facility]. Requested that they please draw BMET and they need order faxed to their office at [phone]. Advised that I would fax order over on Monday.

## 2019-06-20 NOTE — Patient Instructions (Signed)
Medication Instructions:  No medication changes today.  *If you need a refill on your cardiac medications before your next appointment, please call your pharmacy*  Lab Work: Your physician recommends that you return for lab work: BMET  If you have labs (blood work) drawn today and your tests are completely normal, you will receive your results only by: Marland Kitchen MyChart Message (if you have MyChart) OR . A paper copy in the mail If you have any lab test that is abnormal or we need to change your treatment, we will call you to review the results.  Testing/Procedures: None ordered today.  Follow-Up: At [facility], you and your health needs are our priority.  As part of our continuing mission to provide you with exceptional heart care, we have created designated Provider Care Teams.  These Care Teams include your primary Cardiologist (physician) and Advanced Practice Providers (APPs -  Physician Assistants and Nurse Practitioners) who all work together to provide you with the care you need, when you need it.  Your next appointment:   4 week(s)  The format for your next appointment:   In Person  Provider:    You may see Kathlyn Sacramento, MD or one of the following Advanced Practice Providers on your designated Care Team:    Murray Hodgkins, NP  Christell Faith, PA-C  Marrianne Mood, PA-C   Other Instructions  Recommend low sodium (low salt) heart healthy diet  Recommend elevating lower extremities when sitting. For example, sit with your legs on an ottoman or up in the recliner. This will help with swelling.

## 2019-06-20 NOTE — Telephone Encounter (Signed)
Spoke with Leonardo at [facility] regarding appointment today for patient. Inquired if we can do virtual or if we should reschedule. She provided number which we can call for virtual visit with patient. The number was placed in visit notes section and is the number to Valda Favia at that facility and she will facilitate with patient for his visit. Will make provider and CMA aware of updated information.

## 2019-06-20 NOTE — Progress Notes (Signed)
Virtual Visit via Telephone Note   This visit type was conducted due to national recommendations for restrictions regarding the COVID-19 Pandemic (e.g. social distancing) in an effort to limit this patient's exposure and mitigate transmission in our community.  Due to his co-morbid illnesses, this patient is at least at moderate risk for complications without adequate follow up.  This format is felt to be most appropriate for this patient at this time.  The patient did not have access to video technology/had technical difficulties with video requiring transitioning to audio format only (telephone).  All issues noted in this document were discussed and addressed.  No physical exam could be performed with this format.  Please refer to the patient's chart for his  consent to telehealth for [facility].   Date:  06/20/2019   ID:  Gordon [name], DOB [birth year], MRN [id number]  Patient Location: [place] Provider Location: Home  PCP:  Jodi Marble, MD  Cardiologist:  Kathlyn Sacramento, MD  Electrophysiologist:  None   Evaluation Performed:  Follow-Up Visit  Chief Complaint:  Follow up of HF and edema after addition of Lasix  History of Present Illness:    Gordon [name] is a 64 y.o. male with HTN, tobacco abuse, CAD s/p STEMI with unsuccessful PCI to occluded RCA, ICM, chronic combined systolic and diastolic heart failure with EF 30 to 35%, PAF, acute kidney injury in the setting of CKD 3, left MCA territory stroke.    Admitted to [facility] 05/19/2019 after presenting with intermittent chest pain found to be in A. fib RVR.  Altered mental status was noted a code STEMI was not immediately activated, troponin elevated 3500, EKG with inferior ST segment elevation.  Given confusion, head CT performed and did not show any sign of bleeding.  Diagnostic catheterization revealed occluded RCA.  PCI unsuccessful due to rigidity of artery medical therapy recommended.  EF 30 to 35% with mildly  elevated LVEDP.  Renal insufficiency post cath.  2 days post cath having difficulty with word finding and fine motor movements-neurology consulted-MRI with moderate sized acute cortical/subcortical infarct within the left MCA territory with small amount of hemorrhage within the left insula.  Infarcts felt to be embolic.  Discharged 06/03/2019.  Last seen by Jorja Loa, 06/12/2019.  At that time he was started on Lasix 40 mg daily and potassium supplementation.  He reports since that time his lower extremity edema has improved.  Tells me he now only gets intermittent lower extremity edema in his right foot.  Does me he does not have a way to elevate his lower extremities at the facility but encouraged him to keep legs elevated.  Tells me he will do this when he gets home in approximately 2 weeks.  Reports no SOB at rest.  Denies orthopnea, PND.  Does report some dyspnea on exertion only when he does really hard exercises with therapies.  The patient does not have symptoms concerning for COVID-19 infection (fever, chills, cough, or new shortness of breath).    Past Medical History:  Diagnosis Date  . CAD (coronary artery disease)    a. 05/2019 Inf STEMI/Cath: LM nl, LAD 30ost/p/m, 63m, LCX mild diff dzs, RCA 100p/m (unsuccessful PCI), RPDA 90 (filled via L->R collats). EF: 30%.  . Chronic combined systolic (congestive) and diastolic (congestive) heart failure ([place])    a. 05/2019 Echo: EF 30-35%, Gr2 DD, mod red RV fxn, mild RAE, Mild MR, mild to mod TR, mild to mod Ao sclerosis, mildly elev PASP.  Marland Kitchen  CKD (chronic kidney disease), stage III   . Current smoker   . Diabetes mellitus ([place])    A1C 6.1 (05/2019)  . Hyperlipidemia LDL goal <70   . Hypertension   . Inferior MI ([place])    05/2019  . Ischemic cardiomyopathy    a. 05/2019 Echo: EF 30-35%.  . Paroxysmal atrial fibrillation ([place])    a. 05/2019 with CVA at admission. CHADS2VASc 5 (CHF, DM2, HTN, strokex2)-->Eliquis.  . Stroke (cerebrum)  ([place])    a. 05/2019 MRI: mod-sized acute cortical/subcortical infarct w/in L MCA territory. Sm amt of petechial hemorrhage w/in posterior L insula.   Past Surgical History:  Procedure Laterality Date  . CARDIAC CATHETERIZATION    . CORONARY BALLOON ANGIOPLASTY N/A 05/19/2019   Procedure: CORONARY BALLOON ANGIOPLASTY;  Surgeon: Wellington Hampshire, MD;  Location: [place] [facility];  Service: Cardiovascular;  Laterality: N/A;  . CORONARY/GRAFT ACUTE MI REVASCULARIZATION N/A 05/19/2019   Procedure: Coronary/Graft Acute MI Revascularization;  Surgeon: Wellington Hampshire, MD;  Location: [place] [facility];  Service: Cardiovascular;  Laterality: N/A;  . LEFT HEART CATH AND CORONARY ANGIOGRAPHY N/A 05/19/2019   Procedure: LEFT HEART CATH AND CORONARY ANGIOGRAPHY;  Surgeon: Wellington Hampshire, MD;  Location: [place] [facility];  Service: Cardiovascular;  Laterality: N/A;     Current Meds  Medication Sig  . acetaminophen (TYLENOL) 325 MG tablet Take 2 tablets (650 mg total) by mouth every 6 (six) hours as needed for mild pain or headache.  Marland Kitchen amiodarone (PACERONE) 200 MG tablet Take 1 tablet (200 mg total) by mouth daily.  Marland Kitchen apixaban (ELIQUIS) 5 MG TABS tablet Take 1 tablet (5 mg total) by mouth 2 (two) times daily.  . clopidogrel (PLAVIX) 75 MG tablet Take 1 tablet (75 mg total) by mouth daily.  . furosemide (LASIX) 40 MG tablet Take 1 tablet (40 mg total) by mouth daily.  . hydrALAZINE (APRESOLINE) 25 MG tablet Take 1 tablet (25 mg total) by mouth 3 (three) times daily.  . isosorbide mononitrate (IMDUR) 60 MG 24 hr tablet Take 1 tablet (60 mg total) by mouth daily.  . metoprolol succinate (TOPROL-XL) 50 MG 24 hr tablet Take 1 tablet (50 mg total) by mouth daily. Take with or immediately following a meal.  . potassium chloride SA (KLOR-CON) 20 MEQ tablet Take 1 tablet (20 mEq total) by mouth daily.  . rosuvastatin (CRESTOR) 20 MG tablet Take 20 mg by mouth daily.     Allergies:   Patient has no  known allergies.   Social History   Tobacco Use  . Smoking status: Former Smoker    Packs/day: 0.50    Years: 30.00    Pack years: 15.00    Types: Cigarettes    Quit date: 05/19/2019    Years since quitting: 0.0  . Smokeless tobacco: Never Used  Substance Use Topics  . Alcohol use: Not Currently  . Drug use: Never     Family Hx: The patient's family history includes Diabetes in his mother; Hyperlipidemia in his father and mother; Hypertension in his father, mother, and sister; Stroke in his mother.  ROS:   Please see the history of present illness.    Review of Systems  Constitution: Negative for chills, fever and malaise/fatigue.  Cardiovascular: Positive for leg swelling. Negative for chest pain, dyspnea on exertion, irregular heartbeat, near-syncope, orthopnea, palpitations and syncope.  Respiratory: Negative for cough, shortness of breath and wheezing.   Gastrointestinal: Negative for melena, nausea and vomiting.  Genitourinary: Negative  for hematuria.  Neurological: Negative for dizziness, light-headedness and weakness.    All other systems reviewed and are negative.   Prior CV studies:   The following studies were reviewed today:  Echo 05/20/2019 1. Left ventricular ejection fraction, by visual estimation, is 30 to 35%. The left ventricle has moderate to severely decreased function. There is no left ventricular hypertrophy.  2. Left ventricular diastolic parameters are consistent with Grade II diastolic dysfunction (pseudonormalization).  3. There is severe hypokinesis of the inferior and inferoseptal walls.  4. There is septal "bounce" suggestive of increased intrapericardial pressure or constriction.  5. Global right ventricle has moderately reduced systolic function.The right ventricular size is mildly enlarged. No increase in right ventricular wall thickness.  6. Left atrial size was normal.  7. Right atrial size was mildly dilated.  8. Small pericardial  effusion.  9. Mild to moderate mitral annular calcification. 10. The mitral valve is degenerative. Mild mitral valve regurgitation. No evidence of mitral stenosis. 11. The tricuspid valve is not well visualized. Tricuspid valve regurgitation mild-moderate. 12. The aortic valve is tricuspid. Aortic valve regurgitation is not visualized. Mild to moderate aortic valve sclerosis/calcification without any evidence of aortic stenosis. 13. The pulmonic valve was not well visualized. Pulmonic valve regurgitation is not visualized. 14. Mildly dilated pulmonary artery. 15. Mildly elevated pulmonary artery systolic pressure. 16. The inferior vena cava is dilated in size with <50% respiratory variability, suggesting right atrial pressure of 15 mmHg.  Cardiac catheterization 12//7/20  There is moderate to severe left ventricular systolic dysfunction.  LV end diastolic pressure is mildly elevated.  Ost LAD to Prox LAD lesion is 30% stenosed.  Prox LAD to Mid LAD lesion is 30% stenosed.  Mid LAD to Dist LAD lesion is 60% stenosed.  Prox RCA to Mid RCA lesion is 100% stenosed.  RPDA lesion is 90% stenosed.  Post intervention, there is a 80% residual stenosis.  Balloon angioplasty was performed using a BALLOON TREK RX 2.5X20.   1.  Codominant coronary arteries with severe one-vessel coronary artery disease.  The culprit is an occluded proximal right coronary artery which is diffusely diseased and heavily calcified.  Left to right collaterals were noted. 2.  Moderately to severely reduced LV systolic function with an EF of 30 to 35% with akinesis of the inferior wall. 3.  Mildly elevated left ventricular end-diastolic pressure at 21 mmHg. 4.  Unsuccessful balloon angioplasty of the right coronary artery given diffuse disease, calcifications and thrombus.  This was in spite of multiple balloon inflations throughout the whole vessel.   Recommendations: Based on the patient's history and angiographic  findings, I suspect that this is a late presentation inferior ST elevation myocardial infarction or acute on chronic presentation.  Given late presentation, underlying renal failure of unknown chronicity and the fact that the patient was chest pain-free, I elected not to prolong the procedure anymore and accepted results. Recommend medical therapy.  I was also concerned about the patient's continuous right arm movement in spite of instructed him not to do so.  Labs/Other Tests and Data Reviewed:    EKG:  No ECG reviewed.  Recent Labs: 05/20/2019: ALT 18 06/03/2019: Hemoglobin 10.5; Platelets 220 06/12/2019: BUN 31; Creatinine, Ser 2.24; Potassium 3.9; Sodium 139   Recent Lipid Panel Lab Results  Component Value Date/Time   CHOL 108 05/20/2019 04:08 AM   TRIG 54 05/20/2019 04:08 AM   HDL 22 (L) 05/20/2019 04:08 AM   CHOLHDL 4.9 05/20/2019 04:08 AM   LDLCALC  75 05/20/2019 04:08 AM    Wt Readings from Last 3 Encounters:  06/20/19 168 lb 3 oz (76.3 kg)  06/12/19 174 lb (78.9 kg)  06/03/19 165 lb 9.6 oz (75.1 kg)     Objective:    Vital Signs:  BP 138/72   Pulse 68   Ht 5\' 6"  (1.676 m)   Wt 168 lb 3 oz (76.3 kg)   SpO2 97%   BMI 27.15 kg/m    VITAL SIGNS:  reviewed  ASSESSMENT & PLAN:    1. CAD s/p inferior STEMI 05/19/2019 with finding of occluded RCA not amenable to PCI recommended for medical management -no recurrent chest pain, pressure, tightness.  No indication for repeat ischemic evaluation at this time.  Continue Plavix, beta-blocker, nitrate, statin therapy.  No aspirin in setting of Eliquis. 2. Chronic systolic congestive heart failure/ICM -in the setting of recent MI, EF 30 to 35%.  Continue beta-blocker, hydralazine, nitrate.  Bradycardia resolved since reduction in dose of beta-blocker.  No ACE/ARB/ARN I/MRI secondary to kidney function. Edema and weight with improvement since addition of Lasix/K, continue present doses. BMET to be checked at [facility] for reassessment of  kidney function. Anticipate recheck of echo in approx 3 months for re-evaluation of LVEF. Additional therapies pending improvement in kidney function. Consider dapagliflozin at next office visit.  3. HTN - Stable on beta-blocker, hydralazine, nitrate, Lasix. 4. HLD -LDL 75 in December.  Continue statin.  Plan for lipid panel at next in office visit. 5. PAF -A. fib RVR noted at recent hospitalization.  Presently managed on amiodarone, beta-blocker.  Heart rate well controlled, no recurrence of bradycardia.  Continue Eliquis 5 mg twice daily. Denies bleeding complications. Plan for CBC at next office visit. 6. Left MCA territory stroke -likely cardioembolic due to atrial fibrillation.  Undergoing PT and OT at skilled nursing facility.  Continue statin and Eliquis. 7. CKD 3 -monitor renal function carefully in the setting of the antihypertensive and diuretic therapy.  BMP to be drawn at SNF for reevaluation of kidney function after addition of Lasix.  COVID-19 Education: The signs and symptoms of COVID-19 were discussed with the patient and how to seek care for testing (follow up with PCP or arrange E-visit).  The importance of social distancing was discussed today.  Time:   Today, I have spent 15 minutes with the patient with telehealth technology discussing the above problems.     Medication Adjustments/Labs and Tests Ordered: Current medicines are reviewed at length with the patient today.  Concerns regarding medicines are outlined above.   Tests Ordered: Orders Placed This Encounter  Procedures  . Basic metabolic panel    Medication Changes: No orders of the defined types were placed in this encounter.   Follow Up: BMET at SNF to be drawn. Follow up  In Person in 4 week(s) with lipid panel, CBC  Signed, Loel Dubonnet, NP  06/20/2019 5:17 PM    [place]

## 2019-06-20 NOTE — Telephone Encounter (Signed)
Spoke with Brenas and they are in meeting. Advised that I had spoke with Tammy yesterday about appointment scheduled today and needed to confirm if we could do virtual or if we needed to reschedule. She will reach out to them and have them call me back. Reviewed appointment time for today.

## 2019-06-23 ENCOUNTER — Telehealth: Payer: Self-pay | Admitting: Cardiovascular Disease

## 2019-06-23 NOTE — Telephone Encounter (Signed)
Attempted to schedule.  Scheduler will call back tomorrow

## 2019-06-25 NOTE — Telephone Encounter (Signed)
Left message requesting Tammy to call back.

## 2019-06-25 NOTE — Telephone Encounter (Signed)
Tammy from [facility] calling  Wants to confirm with nurse Would like to know while patient is at facility if it will be ok to see someone at the [place] office since it difficult on transportation  Please call to discuss

## 2019-06-27 NOTE — Telephone Encounter (Signed)
Yes I can see him in the [facility] office

## 2019-07-30 NOTE — Telephone Encounter (Addendum)
Patient needs follow up with Dr. Fletcher Anon either here or at [facility]. I left voicemail message for her to please give [place] a call back to schedule. Her personal cell which she left on my voicemail was [phone] and her name is Tammy. She is the one who schedules appointments and transportation for the patients at that facility. Patient needed 4 week follow up from 06/20/19. Will send to scheduling in case she calls back.

## 2019-08-22 NOTE — Telephone Encounter (Signed)
Attempted to schedule.  LMOV to call office.  ° °

## 2019-09-15 NOTE — Telephone Encounter (Signed)
Attempted to contact facility via tammy or rn director   Lmov to call office

## 2019-10-08 NOTE — Telephone Encounter (Signed)
Spoke with facility patient has been discharged.   Spoke with patient and he prefers courtney his niece to schedule.

## 2019-10-08 NOTE — Telephone Encounter (Signed)
Lmov for niece

## 2019-10-17 NOTE — Telephone Encounter (Signed)
l mom with [facility] to schedule

## 2019-11-13 ENCOUNTER — Telehealth: Payer: Self-pay | Admitting: Cardiovascular Disease

## 2019-11-13 NOTE — Telephone Encounter (Signed)
Received request from [place] in interoffice mail and sent to [facility]

## 2019-12-17 NOTE — Telephone Encounter (Signed)
Attempted to schedule. Lmov with courtney to schedule.

## 2019-12-31 ENCOUNTER — Encounter: Payer: Self-pay | Admitting: Cardiovascular Disease

## 2019-12-31 NOTE — Telephone Encounter (Signed)
3 attempts to schedule fu appt. Mailed letter and closing encounter    Attempted to schedule.  LMOV to call office.

## 2020-01-22 ENCOUNTER — Ambulatory Visit: Payer: Self-pay | Admitting: Family

## 2020-02-02 ENCOUNTER — Encounter: Payer: Self-pay | Admitting: Physician Assistant

## 2020-02-02 ENCOUNTER — Ambulatory Visit (INDEPENDENT_AMBULATORY_CARE_PROVIDER_SITE_OTHER): Payer: Medicaid Other | Admitting: Physician Assistant

## 2020-02-02 ENCOUNTER — Other Ambulatory Visit
Admission: RE | Admit: 2020-02-02 | Discharge: 2020-02-02 | Disposition: A | Discharge status: Home / Self Care | Payer: Medicaid Other | Source: Ambulatory Visit | Attending: Physician Assistant | Admitting: Physician Assistant

## 2020-02-02 ENCOUNTER — Other Ambulatory Visit: Payer: Self-pay

## 2020-02-02 VITALS — BP 180/92 | HR 73 | Ht 67.0 in | Wt 165.2 lb

## 2020-02-02 DIAGNOSIS — Z79899 Other long term (current) drug therapy: Secondary | ICD-10-CM | POA: Diagnosis present

## 2020-02-02 DIAGNOSIS — I471 Supraventricular tachycardia, unspecified: Secondary | ICD-10-CM

## 2020-02-02 DIAGNOSIS — Z8673 Personal history of transient ischemic attack (TIA), and cerebral infarction without residual deficits: Secondary | ICD-10-CM

## 2020-02-02 DIAGNOSIS — N1832 Chronic kidney disease, stage 3b: Secondary | ICD-10-CM

## 2020-02-02 DIAGNOSIS — I10 Essential (primary) hypertension: Secondary | ICD-10-CM

## 2020-02-02 DIAGNOSIS — I480 Paroxysmal atrial fibrillation: Secondary | ICD-10-CM

## 2020-02-02 DIAGNOSIS — I255 Ischemic cardiomyopathy: Secondary | ICD-10-CM

## 2020-02-02 DIAGNOSIS — R4701 Aphasia: Secondary | ICD-10-CM

## 2020-02-02 DIAGNOSIS — I5020 Unspecified systolic (congestive) heart failure: Secondary | ICD-10-CM | POA: Diagnosis present

## 2020-02-02 DIAGNOSIS — I2510 Atherosclerotic heart disease of native coronary artery without angina pectoris: Secondary | ICD-10-CM

## 2020-02-02 DIAGNOSIS — R531 Weakness: Secondary | ICD-10-CM

## 2020-02-02 DIAGNOSIS — E785 Hyperlipidemia, unspecified: Secondary | ICD-10-CM

## 2020-02-02 DIAGNOSIS — I252 Old myocardial infarction: Secondary | ICD-10-CM

## 2020-02-02 LAB — BASIC METABOLIC PANEL
Anion gap: 12 (ref 5–15)
BUN: 44 mg/dL — ABNORMAL HIGH (ref 8–23)
CO2: 23 mmol/L (ref 22–32)
Calcium: 9.1 mg/dL (ref 8.9–10.3)
Chloride: 104 mmol/L (ref 98–111)
Creatinine, Ser: 3.49 mg/dL — ABNORMAL HIGH (ref 0.61–1.24)
GFR calc Af Amer: 20 mL/min — ABNORMAL LOW (ref >60)
GFR calc non Af Amer: 17 mL/min — ABNORMAL LOW (ref >60)
Glucose, Bld: 98 mg/dL (ref 70–99)
Potassium: 4 mmol/L (ref 3.5–5.1)
Sodium: 139 mmol/L (ref 135–145)

## 2020-02-02 MED ORDER — HYDRALAZINE HCL 50 MG PO TABS: 50.0000 mg | ORAL_TABLET | Freq: Three times a day (TID) | ORAL | 6 refills | Status: DC

## 2020-02-02 MED ORDER — RIVAROXABAN 15 MG PO TABS: 15.0000 mg | ORAL_TABLET | Freq: Every day | ORAL | 6 refills | Status: DC

## 2020-02-02 NOTE — Progress Notes (Signed)
Office Visit    Patient Name: Gordon [name] Date of Encounter: 02/02/2020  Primary Care Provider:  Jodi Marble, MD Primary Cardiologist:  Kathlyn Sacramento, MD  Chief Complaint    Chief Complaint  Patient presents with  . other    Past due 4 week follow up; CHF. Meds reviewed by the pt.'s bottles. 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  Past Medical History    Past Medical History:  Diagnosis Date  . CAD (coronary artery disease)    a. 05/2019 Inf STEMI/Cath: LM nl, LAD 30ost/p/m, 47m, LCX mild diff dzs, RCA 100p/m (unsuccessful PCI), RPDA 90 (filled via L->R collats). EF: 30%.  . Chronic combined systolic (congestive) and diastolic (congestive) heart failure ([place])    a. 05/2019 Echo: EF 30-35%, Gr2 DD, mod red RV fxn, mild RAE, Mild MR, mild to mod TR, mild to mod Ao sclerosis, mildly elev PASP.  Marland Kitchen CKD (chronic kidney disease), stage III   . Current smoker   . Diabetes mellitus ([place])    A1C 6.1 (05/2019)  . Hyperlipidemia LDL goal <70   . Hypertension   . Inferior MI ([place])    05/2019  . Ischemic cardiomyopathy    a. 05/2019 Echo: EF 30-35%.  . Paroxysmal atrial fibrillation ([place])    a. 05/2019 with CVA at admission. CHADS2VASc 5 (CHF, DM2, HTN, strokex2)-->Eliquis.  . Stroke (cerebrum) ([place])    a. 05/2019 MRI: mod-sized acute cortical/subcortical infarct w/in L MCA territory. Sm amt of petechial hemorrhage w/in posterior L insula.   Past Surgical History:  Procedure Laterality Date  . CARDIAC CATHETERIZATION    . CORONARY BALLOON ANGIOPLASTY N/A 05/19/2019   Procedure: CORONARY BALLOON ANGIOPLASTY;  Surgeon: Wellington Hampshire, MD;  Location: [place] [facility];  Service:  Cardiovascular;  Laterality: N/A;  . CORONARY/GRAFT ACUTE MI REVASCULARIZATION N/A 05/19/2019   Procedure: Coronary/Graft Acute MI Revascularization;  Surgeon: Wellington Hampshire, MD;  Location: [place] [facility];  Service: Cardiovascular;  Laterality: N/A;  . LEFT HEART CATH AND CORONARY ANGIOGRAPHY N/A 05/19/2019   Procedure: LEFT HEART CATH AND CORONARY ANGIOGRAPHY;  Surgeon: Wellington Hampshire, MD;  Location: [place] [facility];  Service: Cardiovascular;  Laterality: N/A;    Allergies  No Known Allergies  History of Present Illness    [name] is a 64 y.o. male with PMH as above.   He was admitted to [facility]19[facility]2019 after presenting with intermittent chest pain and found to be in atrial fibrillation with RVR.  Altered mental status was noted.  Code STEMI was not immediately activated.  Troponin elevated to 3500.  EKG showed inferior ST elevation.  Given confusion, head CT performed and without evidence of bleeding.  Diagnostic catheterization revealed occluded RCA.  PCI was unsuccessfully performed due to rigidity of artery and medical therapy recommended.  EF 30 to 35% with mildly elevated LVEDP.  Renal insufficiency was noted post cath.  2 days post cath, it was noted he had difficulty with word finding and fine motor movements.  Neurology was consulted.  MRI showed moderate sized cortical/subcortical infarct within the left MCA territory with small amount of hemorrhage within the left insula.  Infarcts were felt to be  embolic.  He was discharged 06/03/2019.  He was seen by Murray Hodgkins, NP, 06/12/2019.  At that time, he was started on Lasix 40 mg daily and potassium supplementation.  In the setting of sinus bradycardia, his Toprol was reduced to 50 mg daily.  It was noted that EKG showed sinus bradycardia at 49 bpm, first-degree AV block, right axis deviation, inferior infarct with inferior and anterior lateral T wave inversions.  He was seen via telemedicine 06/20/2019 by Laurann Montana, NP.  He reported at that time that his lower extremity edema had improved.  He continued to get intermittent lower extremity edema of his right foot.  He did not have a way to elevate his lower extremities at his facility but was encouraged to keep his legs elevated.  He was expecting to be home in approximately 2 weeks.  He reported some dyspnea on exertion when doing really heart exercises/PT.  He has recently been followed by [facility] clinic 08/2019 with several medications adjusted and medication list updated today.  His PCP discontinued his Eliquis and started him on Xarelto 20mg . Given his current renal insufficiency, recommendation even before obtaining BMET today has been to reduce to Xarelto 15mg  while we reach out to determine the reason he was changed to Eliquis.  We will obtain a stat BMET.  If renal function is below creatinine clearance 15, we will need to discontinue this medication.  Xarelto is not recommended for renal function below CrCl 30.  We discussed this during our visit.  If Eliquis not discontinued due to medication intolerance, given his renal insufficiency, he should be on Eliquis 5 mg twice daily.  If he cannot be on Eliquis, we should plan for Coumadin with INR checks and set him up with the Coumadin clinic.  Other than the medication confusion, he reports that he is doing well from a cardiac standpoint.  He occasionally feels lightheaded but otherwise no CP, palpitations, dyspnea, pnd, orthopnea, n, v, syncope, edema, weight gain, or early satiety.  He reports ongoing right sided weakness due to stroke.  BP today elevated at 180/92 with [place] clinic BP also severely elevated at 190/92.  Medication changes as below.  Most of our discussion today revolved around his anticoagulation, as well as attempts to figure out the reason for his PCP change.  Per review of [place] clinic records, he does appear to have been on Xarelto for some time now.  I can find Xarelto listed under  clinic visits as early as March 2021.  I cannot find a reason for this change.  He saw his PCP at [facility] on December 30, 2019.  [place] clinic office visit reviewed in detail today without any indication for reason for transition.   Home Medications    Prior to Admission medications   Medication Sig Start Date End Date Taking? Authorizing Provider  amiodarone (PACERONE) 200 MG tablet Take 1 tablet (200 mg total) by mouth daily. 06/12/19 02/02/20 Yes Theora Gianotti, NP  clopidogrel (PLAVIX) 75 MG tablet Take 75 mg by mouth daily. 01/01/20  Yes [provider]  furosemide (LASIX) 40 MG tablet Take 1 tablet (40 mg total) by mouth daily. 06/12/19 02/02/20 Yes Theora Gianotti, NP  isosorbide mononitrate (IMDUR) 60 MG 24 hr tablet Take 1 tablet (60 mg total) by mouth daily. 06/04/19 02/02/20 Yes Wyvonnia Dusky, MD  metoprolol succinate (TOPROL-XL) 50 MG 24 hr tablet Take 1 tablet (50 mg total) by mouth daily. Take with or immediately following  a meal. 06/12/19  Yes Theora Gianotti, NP  potassium chloride SA (KLOR-CON) 20 MEQ tablet Take 1 tablet (20 mEq total) by mouth daily. 06/12/19  Yes Theora Gianotti, NP  rosuvastatin (CRESTOR) 20 MG tablet Take 20 mg by mouth daily.   Yes [provider]  hydrALAZINE (APRESOLINE) 50 MG tablet Take 1 tablet (50 mg total) by mouth 3 (three) times daily. 02/02/20 05/02/20  Marrianne Mood D, PA-C  Rivaroxaban (XARELTO) 15 MG TABS tablet Take 1 tablet (15 mg total) by mouth daily with supper. 02/02/20   Marrianne Mood D, PA-C    Review of Systems    He denies chest pain, palpitations, dyspnea, pnd, orthopnea, n, v, dizziness, syncope, edema, weight gain, or early satiety. He reports occasional lightheadedness (chronic) and ongoing one sided R weakness, due to CVA.   All other systems reviewed and are otherwise negative except as noted above.  Physical Exam    VS:  BP (!) 180/92 (BP Location: Left Arm,  Patient Position: Sitting, Cuff Size: Normal)   Pulse 73   Ht 5\' 7"  (1.702 m)   Wt 165 lb 4 oz (75 kg)   SpO2 96%   BMI 25.88 kg/m  , BMI Body mass index is 25.88 kg/m. GEN: Well nourished, well developed, in no acute distress. HEENT: normal. Neck: Supple, no JVD, carotid bruits, or masses. Cardiac: RRR, no murmurs, rubs, or gallops. No clubbing, cyanosis, edema.  Radials/DP/PT 2+ and equal bilaterally.  Respiratory:  Respirations regular and unlabored, clear to auscultation bilaterally. GI: Soft, nontender, nondistended, BS + x 4. MS: no deformity or atrophy. Skin: warm and dry, no rash. Neuro:  Strength and sensation are intact. Psych: Normal affect.  Accessory Clinical Findings    ECG personally reviewed by me today accelerated junctional rhythm with significant artifact noted on EKG and rhythm strip verus previously reported AT (and most recent / prior tracing showing SB with first-degree block, RAD, lateral TWI), previous inferior infarct as seen on prior tracings, TWI in inferior leads, baseline wander, artifact, QTc 491- no acute changes.  VITALS Reviewed today   Temp Readings from Last 3 Encounters:  06/03/19 97.7 F (36.5 C) (Oral)   BP Readings from Last 3 Encounters:  02/02/20 (!) 180/92  06/20/19 138/72  06/12/19 124/80   Pulse Readings from Last 3 Encounters:  02/02/20 73  06/20/19 68  06/12/19 (!) 49    Wt Readings from Last 3 Encounters:  02/02/20 165 lb 4 oz (75 kg)  06/20/19 168 lb 3 oz (76.3 kg)  06/12/19 174 lb (78.9 kg)     LABS  reviewed today   Lab Results  Component Value Date   WBC 7.9 06/03/2019   HGB 10.5 (L) 06/03/2019   HCT 32.2 (L) 06/03/2019   MCV 91.5 06/03/2019   PLT 220 06/03/2019   Lab Results  Component Value Date   CREATININE 2.24 (H) 06/12/2019   BUN 31 (H) 06/12/2019   NA 139 06/12/2019   K 3.9 06/12/2019   CL 109 06/12/2019   CO2 19 (L) 06/12/2019   Lab Results  Component Value Date   ALT 18 05/20/2019   AST 22  05/20/2019   ALKPHOS 67 05/20/2019   BILITOT 1.0 05/20/2019   Lab Results  Component Value Date   CHOL 108 05/20/2019   HDL 22 (L) 05/20/2019   LDLCALC 75 05/20/2019   TRIG 54 05/20/2019   CHOLHDL 4.9 05/20/2019    Lab Results  Component Value Date   HGBA1C  6.1 (H) 05/21/2019   No results found for: TSH   STUDIES/PROCEDURES reviewed today   Carotids 05/2019 RIGHT CAROTID ARTERY: No significant calcifications of the right common carotid artery. Intermediate waveform maintained. Heterogeneous and partially calcified plaque at the right carotid bifurcation. No significant lumen shadowing. Low resistance waveform of the right ICA. No significant tortuosity. RIGHT VERTEBRAL ARTERY: Antegrade flow with low resistance waveform. LEFT CAROTID ARTERY: No significant calcifications of the left common carotid artery. Intermediate waveform maintained. Heterogeneous and partially calcified plaque at the left carotid bifurcation without significant lumen shadowing. Low resistance waveform of the left ICA. No significant tortuosity. LEFT VERTEBRAL ARTERY: Antegrade flow. Late reversal of the systolic waveform with antegrade diastole. IMPRESSION: Color duplex indicates minimal heterogeneous and calcified plaque, with no hemodynamically significant stenosis by duplex criteria in the extracranial cerebrovascular circulation. Waveform of the left vertebral artery indicates developing proximal left subclavian artery stenosis. Correlation with bilateral upper extremity blood pressure is recommended.   Echo 05/2019 1. Left ventricular ejection fraction, by visual estimation, is 30 to  35%. The left ventricle has moderate to severely decreased function. There  is no left ventricular hypertrophy.  2. Left ventricular diastolic parameters are consistent with Grade II  diastolic dysfunction (pseudonormalization).  3. There is severe hypokinesis of the inferior and inferoseptal walls.   4. There is septal "bounce" suggestive of increased intrapericardial  pressure or constriction.  5. Global right ventricle has moderately reduced systolic function.The  right ventricular size is mildly enlarged. No increase in right  ventricular wall thickness.  6. Left atrial size was normal.  7. Right atrial size was mildly dilated.  8. Small pericardial effusion.  9. Mild to moderate mitral annular calcification.  10. The mitral valve is degenerative. Mild mitral valve regurgitation. No  evidence of mitral stenosis.  11. The tricuspid valve is not well visualized. Tricuspid valve  regurgitation mild-moderate.  12. The aortic valve is tricuspid. Aortic valve regurgitation is not  visualized. Mild to moderate aortic valve sclerosis/calcification without  any evidence of aortic stenosis.  13. The pulmonic valve was not well visualized. Pulmonic valve  regurgitation is not visualized.  14. Mildly dilated pulmonary artery.  15. Mildly elevated pulmonary artery systolic pressure.  16. The inferior vena cava is dilated in size with <50% respiratory  variability, suggesting right atrial pressure of 15 mmHg.    LHC 05/2019  There is moderate to severe left ventricular systolic dysfunction.  LV end diastolic pressure is mildly elevated.  Ost LAD to Prox LAD lesion is 30% stenosed.  Prox LAD to Mid LAD lesion is 30% stenosed.  Mid LAD to Dist LAD lesion is 60% stenosed.  Prox RCA to Mid RCA lesion is 100% stenosed.  RPDA lesion is 90% stenosed.  Post intervention, there is a 80% residual stenosis.  Balloon angioplasty was performed using a BALLOON TREK RX 2.5X20. 1.  Codominant coronary arteries with severe one-vessel coronary artery disease.  The culprit is an occluded proximal right coronary artery which is diffusely diseased and heavily calcified.  Left to right collaterals were noted. 2.  Moderately to severely reduced LV systolic function with an EF of 30 to 35% with  akinesis of the inferior wall. 3.  Mildly elevated left ventricular end-diastolic pressure at 21 mmHg. 4.  Unsuccessful balloon angioplasty of the right coronary artery given diffuse disease, calcifications and thrombus.  This was in spite of multiple balloon inflations throughout the whole vessel. Recommendations: Based on the patient's history and angiographic findings, I suspect  that this is a late presentation inferior ST elevation myocardial infarction or acute on chronic presentation.  Given late presentation, underlying renal failure of unknown chronicity and the fact that the patient was chest pain-free, I elected not to prolong the procedure anymore and accepted results. Recommend medical therapy.  I was also concerned about the patient's continuous right arm movement in spite of instructed him not to do so.   Assessment & Plan    Paroxysmal atrial fibrillation with RVR Accelerated junctional rhythm --Reports occasional lightheadedness (chronic unchanged) and residual right-sided weakness 2/2 stroke. EKG today with significant artifact and new accelerated junctional rhythm noted versus atrial tachycardia.  --Given his EKG today with history of ischemic heart disease and low EF, recommend consider referral to EP at RTC. On review of EMR, it does not appear that this yet has been discussed with the patient, and it may be that we should consider financial barriers. For now, current Toprol 50mg , which was reduced at his visit 05/2019.  He is on amiodarone 200 mg daily with recommendation to continue for now with monitoring of TSH, PFTs, and chest x-ray per monitoring guidelines. QTc 491. --CHA2DS2VASc score of at least 5  (CHF, HTN, DM2 (Hgb 6.1), strokex2) soon to be 56 (age), with long term [place] indicated. PCP discontinued Eliquis and started Xarelto 20 mg. Xarelto is not ideal given his renal insufficiency annot ideal given his renal insufficiencyd reduced to Xarelto 15mg  today. Ordered BMET.   No signs or symptoms of bleeding with repeat CBC recommended at RTC.  We will need to clarify with PCP the reason for his transition from Eliquis to [place] and ensure not due to intolerance, etc.  If he is unable to take Eliquis, transition to Coumadin/Coumadin clinic.  Further recommendations pending reassessment of renal function and clarification from PCP.  H/o inferior STEMI CAD not amenable to PCI with recommendation for medical management --No current CP.  Occluded proximal right coronary artery was diffusely diseased and heavily calcified with left to right collaterals.  Balloon angioplasty performed on multiple locations and not successful due to diffuse disease, calcifications, and thrombus.  This was suspected to be likely an acute on chronic occlusion.  EF 30 to 35% with inferior hypokinesis.  Peak high-sensitivity Tn 3729. --Continue Plavix.  No ASA in the setting of NOAC. For unclear reason, he was started on Xarelto 20 mg back in March 2021 by [facility] clinic.  Eliquis discontinued.  We will need to clarify the reason for this change, as he should ideally be on Eliquis or Coumadin.  For now, continue reduced dose Xarelto 15 mg in place of ASA.  Check a BMET.  Continue BB, statin, increased dose hydralazine.  BP suboptimal and will increase to hydralazine 50 mg 3 times daily.   HTN --BP suboptimal today at 180/92 with [place] clinic BP at 196/100.  At his previous clinic visit with Murray Hodgkins, NP, he was started on low-dose Lasix and potassium supplementation.  Continue.  Continue current Toprol 50 mg daily.  Will increase current hydralazine to 50 mg 3 times daily.  Call the office to let us know how you are doing after a week.  ICM/HFrEF --No SOB.  LAE resolved with initiation of Lasix and potassium supplementation. EF 30-35% with inferior WMA. Euvolemic on exam today.  Continue current medications. No ACE/ARB/ARNI given renal function.   History of acute L MCA territory  stroke --Per review of PCP notes, he was referred to neurology at his last visit.  He reports occasional lightheadedness and ongoing weakness on his right side, attributed to his stroke..  Ongoing word finding difficulty today and slurred speech, which she reports is baseline.   AOCKD --Previous Cr stable at 1.66 during his admission.  On review of [place] clinic visits, I am unable to see labs, but it does appear as if his renal function is significantly elevated based on one Scr value referenced in the visits.   As above, Eliquis or Coumadin recommended given his renal insufficiency.  We will reach out to PCP/[place] clinic and try to determine the reason that his Eliquis was discontinued and the patient was started on full dose Xarelto between visits.  Recommend nephrology follow-up.  H/o Tobacco abuse --Ongoing smoking cessation advised.  HLD --Continue Lipitor. with goal LDL <70. Repeat LDL/LFTs at RTC if unable to receive labs from PCP. It is unclear if this was drawn by his PCP. Notes just list the HDL.  DM2 --Ongoing glycemic control recommended.  Medication changes: ???PCP discontinued Eliquis for unclear reasons..  Continue Xarelto for now until can clarify but at reduced dose 15 mg daily, given his renal function does not permit Xarelto 20 mg.  Increase to hydralazine 50 mg 3 times daily for BP support. Labs ordered: BMET, CMET and LDL at RTC. Amiodarone monitoring labs at RTC. Studies / Imaging ordered: Echo, ?referral to EP given low EF and past three EKGs as outlined above Future considerations: Repeat CBC at RTC. May need Coumadin if renal function continues to decline  Disposition: RTC - recommend see primary cardiologist if possible  Arvil Chaco, PA-C 02/02/2020

## 2020-02-02 NOTE — Patient Instructions (Signed)
Medication Instructions:  - Your physician has recommended you make the following change in your medication:   1) INCREASE hydralazine to 50 mg- take 1 tablet by mouth three times a day  2) DECREASE xarelto to 15 mg- take 1 tablet by mouth once daily with supper  *If you need a refill on your cardiac medications before your next appointment, please call your pharmacy*   Lab Work: - Your physician recommends that you have lab work today: [facility] entrance - 1st desk on the right to check in (Registration)  If you have labs (blood work) drawn today and your tests are completely normal, you will receive your results only by: Marland Kitchen MyChart Message (if you have MyChart) OR . A paper copy in the mail If you have any lab test that is abnormal or we need to change your treatment, we will call you to review the results.   Testing/Procedures: - none ordered   Follow-Up: At [facility], you and your health needs are our priority.  As part of our continuing mission to provide you with exceptional heart care, we have created designated Provider Care Teams.  These Care Teams include your primary Cardiologist (physician) and Advanced Practice Providers (APPs -  Physician Assistants and Nurse Practitioners) who all work together to provide you with the care you need, when you need it.  We recommend signing up for the patient portal called "MyChart".  Sign up information is provided on this After Visit Summary.  MyChart is used to connect with patients for Virtual Visits (Telemedicine).  Patients are able to view lab/test results, encounter notes, upcoming appointments, etc.  Non-urgent messages can be sent to your provider as well.   To learn more about what you can do with MyChart, go to [email or web address].    Your next appointment:   1 month(s)  The format for your next appointment:   In Person  Provider:    You may see Kathlyn Sacramento, MD or one of the following Advanced  Practice Providers on your designated Care Team:    Murray Hodgkins, NP  Christell Faith, PA-C  Marrianne Mood, PA-C    Other Instructions n/a

## 2020-02-03 ENCOUNTER — Telehealth: Payer: Self-pay | Admitting: *Deleted

## 2020-02-03 DIAGNOSIS — Z79899 Other long term (current) drug therapy: Secondary | ICD-10-CM

## 2020-02-03 DIAGNOSIS — I5020 Unspecified systolic (congestive) heart failure: Secondary | ICD-10-CM

## 2020-02-03 DIAGNOSIS — I255 Ischemic cardiomyopathy: Secondary | ICD-10-CM

## 2020-02-03 NOTE — Telephone Encounter (Signed)
-----   Message from Arvil Chaco, PA-C sent at 02/02/2020  6:26 PM EDT ----- Renal function slightly worse from previous labs and those collected by your PCP at [facility].  Electrolytes are stable.  He should not miss his kidney doctor appointment (it is currently for 02/26/2020).  We reduced him to Xarelto 15 mg today.   If his kidney function continues to decline, he will need switched from Xarelto to Coumadin (as we discussed today). Let's hold off on that for now. If agreeable, let's get a BMET and CBC in 2 weeks to see if his kidneys improve with the medication changes we made today, as I suspect this will help.

## 2020-02-03 NOTE — Telephone Encounter (Signed)
No answer. Left message to call back.   

## 2020-02-04 ENCOUNTER — Telehealth: Payer: Self-pay | Admitting: *Deleted

## 2020-02-04 NOTE — Telephone Encounter (Signed)
The patient has been notified of the result and verbalized understanding.  All questions (if any) were answered. Eliberto Ivory Tyrica Afzal, RN 02/04/2020 1:49 PM   He also asked that I call his niece, Loma Sousa, to go over the results as well.  She verbalized understanding and is aware to get lab work in 2 weeks at [facility]. She was very Patent attorney.

## 2020-02-04 NOTE — Telephone Encounter (Signed)
-----   Message from Valora Corporal, RN sent at 02/04/2020  3:11 PM EDT ----- Regarding: FW: PCP and anticoagulation  ----- Message ----- From: Valora Corporal, RN Sent: 02/04/2020   7:50 AM EDT To: Arvil Chaco, PA-C Subject: RE: PCP and anticoagulation                    Message routed to PCP. ----- Message ----- From: Arvil Chaco, PA-C Sent: 02/03/2020   7:00 PM EDT To: [facility]bec[facility] Triage Subject: PCP and anticoagulation                        Can we please reach out to this patient's PCP and find out the reason his PCP started him on Xarelto? He was changed from Eliquis to Xarelto between visits.   Given his renal function, he should be on either Eliquis or Coumadin.  In clinic, I reduced his Xarelto to 15mg ; however, given labs later that day, he should be on Eliquis or Coumadin at this time.  If no cost or allergy reason why his PCP says he cannot restart on Eliquis, can we restart him on his previous Eliquis 5mg  BID?

## 2020-02-04 NOTE — Telephone Encounter (Signed)
Spoke with Angie RN at [facility] clinic and she does not see any reason for switching to that medication. She states that if we feel it appropriate to change then they would not have any objections since we are the cardiology speciality. She states that he frequently does not show up for appointments. Let her know that based on his labs they do want to switch him back to previous medication and she stated that her provider would not object. Will route back to provider.

## 2020-02-06 ENCOUNTER — Other Ambulatory Visit: Payer: Self-pay

## 2020-02-06 ENCOUNTER — Other Ambulatory Visit
Admission: RE | Admit: 2020-02-06 | Discharge: 2020-02-06 | Disposition: A | Discharge status: Home / Self Care | Payer: Medicaid Other | Attending: Physician Assistant | Admitting: Physician Assistant

## 2020-02-06 ENCOUNTER — Telehealth: Payer: Self-pay | Admitting: *Deleted

## 2020-02-06 DIAGNOSIS — I255 Ischemic cardiomyopathy: Secondary | ICD-10-CM | POA: Diagnosis not present

## 2020-02-06 DIAGNOSIS — I5020 Unspecified systolic (congestive) heart failure: Secondary | ICD-10-CM | POA: Diagnosis present

## 2020-02-06 DIAGNOSIS — Z79899 Other long term (current) drug therapy: Secondary | ICD-10-CM | POA: Insufficient documentation

## 2020-02-06 DIAGNOSIS — I10 Essential (primary) hypertension: Secondary | ICD-10-CM

## 2020-02-06 DIAGNOSIS — I480 Paroxysmal atrial fibrillation: Secondary | ICD-10-CM

## 2020-02-06 LAB — BASIC METABOLIC PANEL
Anion gap: 15 (ref 5–15)
BUN: 38 mg/dL — ABNORMAL HIGH (ref 8–23)
CO2: 26 mmol/L (ref 22–32)
Calcium: 9.2 mg/dL (ref 8.9–10.3)
Chloride: 105 mmol/L (ref 98–111)
Creatinine, Ser: 3.31 mg/dL — ABNORMAL HIGH (ref 0.61–1.24)
GFR calc Af Amer: 22 mL/min — ABNORMAL LOW (ref >60)
GFR calc non Af Amer: 19 mL/min — ABNORMAL LOW (ref >60)
Glucose, Bld: 99 mg/dL (ref 70–99)
Potassium: 3.8 mmol/L (ref 3.5–5.1)
Sodium: 146 mmol/L — ABNORMAL HIGH (ref 135–145)

## 2020-02-06 LAB — CBC WITH DIFFERENTIAL/PLATELET
Abs Immature Granulocytes: 0.07 10*3/uL (ref 0.00–0.07)
Basophils Absolute: 0 10*3/uL (ref 0.0–0.1)
Basophils Relative: 1 %
Eosinophils Absolute: 0.2 10*3/uL (ref 0.0–0.5)
Eosinophils Relative: 2 %
HCT: 40.2 % (ref 39.0–52.0)
Hemoglobin: 13.1 g/dL (ref 13.0–17.0)
Immature Granulocytes: 1 %
Lymphocytes Relative: 26 %
Lymphs Abs: 2.2 10*3/uL (ref 0.7–4.0)
MCH: 30.5 pg (ref 26.0–34.0)
MCHC: 32.6 g/dL (ref 30.0–36.0)
MCV: 93.5 fL (ref 80.0–100.0)
Monocytes Absolute: 0.8 10*3/uL (ref 0.1–1.0)
Monocytes Relative: 9 %
Neutro Abs: 5.1 10*3/uL (ref 1.7–7.7)
Neutrophils Relative %: 61 %
Platelets: 168 10*3/uL (ref 150–400)
RBC: 4.3 MIL/uL (ref 4.22–5.81)
RDW: 14.8 % (ref 11.5–15.5)
WBC: 8.3 10*3/uL (ref 4.0–10.5)
nRBC: 0 % (ref 0.0–0.2)

## 2020-02-06 MED ORDER — APIXABAN 5 MG PO TABS: 5.0000 mg | ORAL_TABLET | Freq: Two times a day (BID) | ORAL | 1 refills | Status: DC

## 2020-02-06 NOTE — Telephone Encounter (Signed)
Called and s/w patient's niece ok per DPR. She verbalized understanding of results and plan of care to stop Xarelto and start Eliquis 5 mg by mouth two times a day. She is aware to still have him get lab work at [facility] in about 2 weeks. Rx sent to pharmacy. Med list updated.

## 2020-02-06 NOTE — Telephone Encounter (Signed)
-----   Message from Arvil Chaco, PA-C sent at 02/06/2020 11:35 AM EDT ----- --Renal function slightly improved from previous labs.  --Still recommend he gets a kidney doctor.  --Blood counts stable, which is good news! --Spoke with our pharmacist about the most recent labs.   --Stop Xarelto 15mg  qd. --Restart Eliquis 5mg  BID.  --Repeat BMET and CBC ~ 2 weeks (already scheduled).  Please make sure he does not take Xarelto. He should only take Eliquis.

## 2020-03-04 ENCOUNTER — Ambulatory Visit: Payer: Medicare Other | Admitting: Physician Assistant

## 2020-03-05 ENCOUNTER — Encounter: Payer: Self-pay | Admitting: Physician Assistant

## 2020-09-10 ENCOUNTER — Encounter: Payer: Self-pay | Admitting: Cardiovascular Disease

## 2020-10-04 ENCOUNTER — Other Ambulatory Visit: Payer: Self-pay | Admitting: Nephrology

## 2020-10-04 ENCOUNTER — Other Ambulatory Visit (HOSPITAL_COMMUNITY): Payer: Self-pay | Admitting: Nephrology

## 2020-10-04 DIAGNOSIS — N184 Chronic kidney disease, stage 4 (severe): Secondary | ICD-10-CM

## 2020-10-04 NOTE — Progress Notes (Signed)
 [facility] New Consult Visit  Patient Name: Gordon [name], male   Patient DOB: [birth year] Date of Service: 10/04/2020  Patient MRN: [id number] Provider Creating Note: Bonnell Sherry, MD  [phone] Primary Care Physician: No primary care provider on file.  [address] [place] [address] Additional Physicians/ Providers:    History of Present Illness Gordon [name] is a 65 y.o. male with past medical history of hypertension, congestive heart failure ejection fraction 30 to 35%, atrial fibrillation, coronary artery disease, hyperlipidemia, history of CVA who was referred the evaluation of chronic kidney disease stage IV.  I appreciate labs and records forwarded to our office for review.  On 02/06/2020 creatinine was 3.31 with an EGFR of 27.  Prior to this on 06/10/2020 creatinine was 2.2 with an EGFR of 30.  He has risk factors for chronic kidney disease including age, hypertension, and known congestive heart failure.  He currently denies nausea, vomiting, or dysgeusia.  Review of prior urinalysis was negative for proteinuria.   The following portions of the patient's chart were reviewed in this encounter and updated as appropriate:  Tobacco  Allergies  Meds  Problems  Med Hx  Surg Hx  Fam Hx         Medications   Current Outpatient Medications:  .  amiodarone  (PACERONE ) 200 MG tablet, Take 200 mg by mouth, Disp: , Rfl:  .  apixaban  (ELIQUIS ) 5 MG tablet, Take 5 mg by mouth, Disp: , Rfl:  .  furosemide  (LASIX ) 40 MG tablet, Take 40 mg by mouth 1 (one) time each day, Disp: , Rfl:  .  hydrALAZINE  50 MG tablet, Take 50 mg by mouth, Disp: , Rfl:  .  isosorbide  mononitrate (IMDUR ) 60 MG 24 hr tablet, Take 60 mg by mouth, Disp: , Rfl:  .  metoprolol  succinate XL (TOPROL  XL) 50 MG 24 hr tablet, Take 50 mg by mouth, Disp: , Rfl:  .  potassium chloride  (KLOR-CON  M20) 20 MEQ CR tablet, Take 20 mEq by mouth, Disp: , Rfl:  .  rosuvastatin  (CRESTOR ) 20 MG tablet, Take 20 mg  by mouth 1 (one) time each day, Disp: , Rfl:    Allergies Patient has no known allergies.  Problem List Patient Active Problem List  Diagnosis  . Chronic kidney disease, Stage IV (severe) (HCC)  . Hypertensive chronic kidney disease, benign, with chronic kidney disease stage I through stage IV, or unspecified  . Hypokalemia  . Congestive heart failure (HCC)  . Anemia in chronic kidney disease     Review of Systems  Constitutional: Positive for malaise/fatigue. Negative for chills and fever.  HENT: Negative for congestion, ear pain, hearing loss and sore throat.   Eyes: Negative for pain and discharge.  Respiratory: Positive for shortness of breath. Negative for cough and wheezing.   Cardiovascular: Positive for leg swelling. Negative for chest pain and palpitations.  Gastrointestinal: Negative for abdominal pain, blood in stool, constipation, diarrhea, nausea and vomiting.  Genitourinary: Negative for dysuria, frequency, hematuria and urgency.  Musculoskeletal: Negative for back pain, myalgias and neck pain.  Skin: Negative for rash.  Neurological: Negative for dizziness, tremors and headaches.  Endo/Heme/Allergies: Negative for polydipsia. Does not bruise/bleed easily.  Psychiatric/Behavioral: Negative for depression.     History Past Medical History:  Diagnosis Date  . Anemia in chronic kidney disease 02/26/2020  . Atrial fibrillation (HCC)   . Chronic kidney disease, Stage IV (severe) (HCC) 02/26/2020  . Congestive heart failure (HCC) 02/26/2020  . Coronary atherosclerosis  of unspecified type of vessel, native or graft   . Hyperlipidemia   . Hypertensive chronic kidney disease, benign, with chronic kidney disease stage I through stage IV, or unspecified 02/26/2020  . Hypokalemia 02/26/2020  . Ischemic stroke [facility])     Past Surgical History:  Procedure Laterality Date  . CARDIAC CATHETERIZATION    . CORONARY ANGIOPLASTY     Family History  Problem Relation Age of  Onset  . Stroke Mother   . Hypertension Mother   . Diabetes Mother   . Cancer Father    Social History   Tobacco Use  . Smoking status: Former Games developer  . Smokeless tobacco: Never Used  Substance Use Topics  . Alcohol use: Not Currently        Physical Exam  Vitals BP 154/89 (BP Location: Right upper arm, Patient Position: Sitting)   Pulse 53   Temp 98.2 F   Ht 5' 7 (1.702 m)   Wt 171 lb (77.6 kg)   SpO2 97%   BMI 26.78 kg/m   PHYSICAL EXAM: General appearance: well developed, well nourished, NAD Eyes: anicteric sclerae, moist conjunctivae; no lid-lag  HENT: Atraumatic; hearing intact Neck: Trachea midline; supple Lungs: CTAB, with normal respiratory effort  CV: Irregular, no rubs Abdomen: Soft, non-tender; bowel sounds present Extremities: No peripheral edema, cyanosis, or clubbing. Skin: Warm and dry, normal skin turgor, no rashes noted. Psych: Appropriate affect, alert and oriented to person, place and time  Neuro: Ambulates using a rolling walker  Laboratory Studies   06/12/2019: Creatinine 2.2, EGFR 30 02/06/2020: Creatinine 3.3, EGFR 22  Imaging and Other Studies    Problem List Items Addressed This Visit   None   Visit Diagnoses    Chronic kidney disease stage 4 (HCC)    -  Primary   Benign essential hypertension         Orders Placed This Encounter  . Ultrasound renal complete  . Renal Function Panel  . CBC and Differential  . Protein electrophoresis, serum  . Protein Electrophoresis, Urine Random  . PTH, Intact  . ANA Panel  . ANCA Panel  . Glomerular basement membrane antibodies       Impression/Recommendations   Gordon [name] is a 65 y.o. male with past medical history of hypertension, congestive heart failure ejection fraction 30 to 35%, atrial fibrillation, coronary artery disease, hyperlipidemia, history of CVA who was referred the evaluation of chronic kidney disease stage IV.   1.  Chronic kidney disease stage IV.  Patient  certainly has risk factors for chronic kidney disease including age, hypertension, and congestive heart failure with ejection fraction of 30 to 35%.  We will proceed with additional work-up including renal ultrasound, SPEP, UPEP, ANA, ANCA antibodies, GBM antibodies.  Further plan to be determined once these labs are available.  2.  Hypertension.  Blood pressure currently 154/89.  Maintain the patient on current doses of hydralazine  and metoprolol .  Consider adding losartan at the next visit if blood pressure remains high.  3.  Chronic systolic heart failure.  Ejection fraction 30 to 35% at last check.  Actively following with cardiology.  Maintain the patient on Lasix  as well as metoprolol .  4.  I would like to thank [facility] for the kind referral.  Patient to return to clinic in 4 weeks or sooner for any acute issues.  Return in about 4 weeks (around 11/01/2020).   Munsoor Lateef, MD

## 2020-10-28 ENCOUNTER — Ambulatory Visit: Payer: Medicare Other | Attending: Nephrology

## 2021-10-19 IMAGING — DX DG CHEST 1V PORT
1 series · 1 of 1 positions shown · non-contrast
Comparison: None.

CLINICAL DATA: Altered mental status

EXAM:
PORTABLE CHEST 1 VIEW

[chest ap]
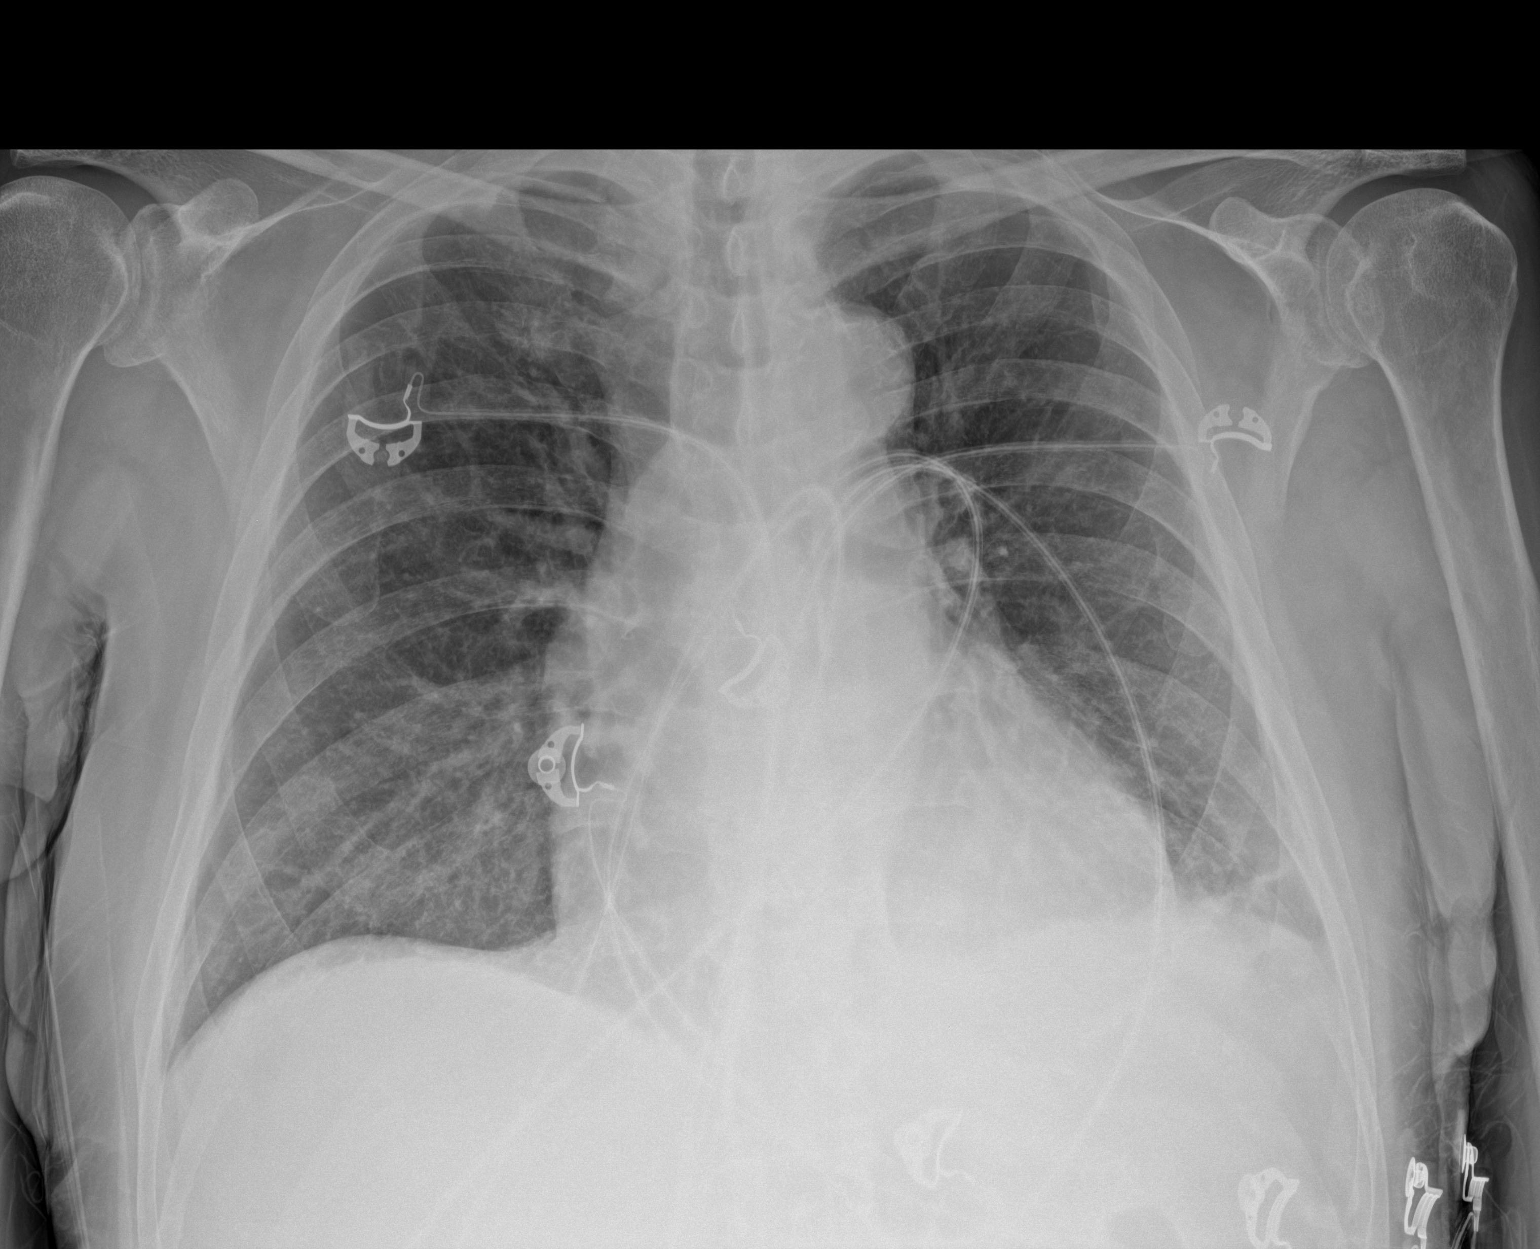

[1 of 1 positions shown; findings below may reference images not displayed]

FINDINGS: Mild atelectasis at the left lung base. No pleural effusion or
pneumothorax. Heart size is normal.
IMPRESSION: Mild atelectasis at the left lung base.

## 2023-12-29 ENCOUNTER — Other Ambulatory Visit: Payer: Self-pay

## 2023-12-29 ENCOUNTER — Emergency Department
Admission: EM | Admit: 2023-12-29 | Discharge: 2023-12-29 | Disposition: A | Discharge status: Home / Self Care | Attending: Emergency Medicine | Admitting: Emergency Medicine

## 2023-12-29 ENCOUNTER — Emergency Department

## 2023-12-29 DIAGNOSIS — Z7901 Long term (current) use of anticoagulants: Secondary | ICD-10-CM | POA: Insufficient documentation

## 2023-12-29 DIAGNOSIS — R600 Localized edema: Secondary | ICD-10-CM | POA: Insufficient documentation

## 2023-12-29 DIAGNOSIS — E876 Hypokalemia: Secondary | ICD-10-CM | POA: Diagnosis not present

## 2023-12-29 DIAGNOSIS — I509 Heart failure, unspecified: Secondary | ICD-10-CM | POA: Insufficient documentation

## 2023-12-29 DIAGNOSIS — Z760 Encounter for issue of repeat prescription: Secondary | ICD-10-CM | POA: Insufficient documentation

## 2023-12-29 LAB — CBC
HCT: 45.9 % (ref 39.0–52.0)
Hemoglobin: 15.5 g/dL (ref 13.0–17.0)
MCH: 31.9 pg (ref 26.0–34.0)
MCHC: 33.8 g/dL (ref 30.0–36.0)
MCV: 94.4 fL (ref 80.0–100.0)
Platelets: 124 K/uL — ABNORMAL LOW (ref 150–400)
RBC: 4.86 MIL/uL (ref 4.22–5.81)
RDW: 13.9 % (ref 11.5–15.5)
WBC: 7.4 K/uL (ref 4.0–10.5)
nRBC: 0 % (ref 0.0–0.2)

## 2023-12-29 LAB — BASIC METABOLIC PANEL WITH GFR
Anion gap: 11 (ref 5–15)
BUN: 23 mg/dL (ref 8–23)
CO2: 19 mmol/L — ABNORMAL LOW (ref 22–32)
Calcium: 9.1 mg/dL (ref 8.9–10.3)
Chloride: 111 mmol/L (ref 98–111)
Creatinine, Ser: 2.25 mg/dL — ABNORMAL HIGH (ref 0.61–1.24)
GFR, Estimated: 31 mL/min — ABNORMAL LOW (ref >60)
Glucose, Bld: 114 mg/dL — ABNORMAL HIGH (ref 70–99)
Potassium: 3.4 mmol/L — ABNORMAL LOW (ref 3.5–5.1)
Sodium: 141 mmol/L (ref 135–145)

## 2023-12-29 LAB — TROPONIN I (HIGH SENSITIVITY): Troponin I (High Sensitivity): 13 ng/L (ref <18)

## 2023-12-29 LAB — BRAIN NATRIURETIC PEPTIDE: B Natriuretic Peptide: 308.3 pg/mL — ABNORMAL HIGH (ref 0.0–100.0)

## 2023-12-29 MED ORDER — HYDRALAZINE HCL 50 MG PO TABS: 50.0000 mg | ORAL_TABLET | Freq: Three times a day (TID) | ORAL | Status: DC

## 2023-12-29 MED ORDER — AMIODARONE HCL 200 MG PO TABS
200.0000 mg | ORAL_TABLET | Freq: Every day | ORAL | Status: DC
  Administered 2023-12-29: 200 mg via ORAL
  Filled 2023-12-29: qty 1

## 2023-12-29 MED ORDER — FUROSEMIDE 40 MG PO TABS: 40.0000 mg | ORAL_TABLET | Freq: Every day | ORAL | 5 refills | Status: DC

## 2023-12-29 MED ORDER — ROSUVASTATIN CALCIUM 20 MG PO TABS: 20.0000 mg | ORAL_TABLET | Freq: Every day | ORAL | Status: DC

## 2023-12-29 MED ORDER — AMIODARONE HCL 200 MG PO TABS: 200.0000 mg | ORAL_TABLET | Freq: Every day | ORAL | 0 refills | Status: DC

## 2023-12-29 MED ORDER — ROSUVASTATIN CALCIUM 20 MG PO TABS: 20.0000 mg | ORAL_TABLET | Freq: Every day | ORAL | 0 refills | Status: DC

## 2023-12-29 MED ORDER — APIXABAN 5 MG PO TABS: 5.0000 mg | ORAL_TABLET | Freq: Two times a day (BID) | ORAL | 1 refills | Status: DC

## 2023-12-29 MED ORDER — METOPROLOL SUCCINATE ER 50 MG PO TB24
50.0000 mg | ORAL_TABLET | Freq: Every day | ORAL | Status: DC
  Administered 2023-12-29: 50 mg via ORAL
  Filled 2023-12-29: qty 1

## 2023-12-29 MED ORDER — METOPROLOL SUCCINATE ER 50 MG PO TB24: 50.0000 mg | ORAL_TABLET | Freq: Every day | ORAL | 0 refills | Status: DC

## 2023-12-29 MED ORDER — HYDRALAZINE HCL 50 MG PO TABS: 50.0000 mg | ORAL_TABLET | Freq: Three times a day (TID) | ORAL | 6 refills | Status: DC

## 2023-12-29 MED ORDER — APIXABAN 5 MG PO TABS
5.0000 mg | ORAL_TABLET | Freq: Two times a day (BID) | ORAL | Status: DC
  Administered 2023-12-29: 5 mg via ORAL
  Filled 2023-12-29: qty 1

## 2023-12-29 MED ORDER — CLOPIDOGREL BISULFATE 75 MG PO TABS: 75.0000 mg | ORAL_TABLET | Freq: Every day | ORAL | 0 refills | Status: DC

## 2023-12-29 MED ORDER — POTASSIUM CHLORIDE CRYS ER 20 MEQ PO TBCR: 20.0000 meq | EXTENDED_RELEASE_TABLET | Freq: Every day | ORAL | 5 refills | Status: DC

## 2023-12-29 MED ORDER — ISOSORBIDE MONONITRATE ER 60 MG PO TB24: 60.0000 mg | ORAL_TABLET | Freq: Every day | ORAL | Status: DC

## 2023-12-29 MED ORDER — CLOPIDOGREL BISULFATE 75 MG PO TABS
75.0000 mg | ORAL_TABLET | Freq: Every day | ORAL | Status: DC
  Administered 2023-12-29: 75 mg via ORAL
  Filled 2023-12-29: qty 1

## 2023-12-29 MED ORDER — FUROSEMIDE 40 MG PO TABS
40.0000 mg | ORAL_TABLET | Freq: Every day | ORAL | Status: DC
  Administered 2023-12-29: 40 mg via ORAL
  Filled 2023-12-29: qty 1

## 2023-12-29 MED ORDER — ISOSORBIDE MONONITRATE ER 60 MG PO TB24
60.0000 mg | ORAL_TABLET | Freq: Every day | ORAL | Status: DC
  Administered 2023-12-29: 60 mg via ORAL
  Filled 2023-12-29: qty 1

## 2023-12-29 NOTE — ED Triage Notes (Signed)
 Pt states R leg swelling, pt also HTN, pt states he has been trying to get into PCP but can't and they wont RX meds until seen, pt states 3 weeks without meds. NAD noted.

## 2023-12-29 NOTE — ED Provider Notes (Signed)
 [facility] Provider Note   Event Date/Time   First MD Initiated Contact with Patient 12/29/23 1218     (approximate) History  Leg Swelling  HPI Gordon [name] is a 68 y.o. male with past medical history of CHF, atrial fibrillation, and chronic bilateral lower extremity edema who presents complaining of worsening bilateral lower extremity edema, palpitations, and occasional shortness of breath.  Patient states that the symptoms have been present over the last 2 months after he had been out of his medications.  Patient states that he was told that he needed to follow-up with a specialist before he could be prescribed these medications again however the specialist canceled his appointment and he has been waiting for a follow-up. ROS: Patient currently denies any vision changes, tinnitus, difficulty speaking, facial droop, sore throat, chest pain, shortness of breath, abdominal pain, nausea/vomiting/diarrhea, dysuria, or weakness/numbness/paresthesias in any extremity   Physical Exam  Triage Vital Signs: ED Triage Vitals [12/29/23 1213]  Encounter Vitals Group     BP (!) 219/98     Girls Systolic BP Percentile      Girls Diastolic BP Percentile      Boys Systolic BP Percentile      Boys Diastolic BP Percentile      Pulse Rate 92     Resp 20     Temp 97.8 F (36.6 C)     Temp Source Oral     SpO2 97 %     Weight 165 lb 5.5 oz (75 kg)     Height 5' 7 (1.702 m)     Head Circumference      Peak Flow      Pain Score 0     Pain Loc      Pain Education      Exclude from Growth Chart    Most recent vital signs: Vitals:   12/29/23 1213 12/29/23 1236  BP: (!) 219/98 (!) 209/94  Pulse: 92 71  Resp: 20   Temp: 97.8 F (36.6 C)   SpO2: 97%    General: Awake, oriented x4. CV:  Good peripheral perfusion. Resp:  Normal effort. Abd:  No distention. Other:  Elderly overweight Caucasian male resting comfortably in no acute distress.  1+ pitting edema to bilateral  lower extremities ED Results / Procedures / Treatments  Labs (all labs ordered are listed, but only abnormal results are displayed) Labs Reviewed  BASIC METABOLIC PANEL WITH GFR  CBC  BRAIN NATRIURETIC PEPTIDE  TROPONIN I (HIGH SENSITIVITY)   EKG ED ECG REPORT I, Artist MARLA Kerns, the attending physician, personally viewed and interpreted this ECG. Date: 12/29/2023 EKG Time: 1215 Rate: 79 Rhythm: Atrial fibrillation QRS Axis: normal Intervals: normal ST/T Wave abnormalities: normal Narrative Interpretation: Rate controlled atrial fibrillation.  No evidence of acute ischemia RADIOLOGY ED MD interpretation: 2 view chest x-ray interpreted by me shows no evidence of acute abnormalities including no pneumonia, pneumothorax, or widened mediastinum - All radiology independently interpreted and agree with radiology assessment Official radiology report(s): DG Chest 2 View Result Date: 12/29/2023 CLINICAL DATA:  Shortness of breath EXAM: CHEST - 2 VIEW COMPARISON:  Chest x-ray 05/19/2019 FINDINGS: Image tray shin of the right hemidiaphragm. No consolidation, pneumothorax or effusion. Normal cardiopericardial silhouette without edema. Calcified tortuous aorta. Apical pleural thickening. IMPRESSION: No acute cardiopulmonary disease. Electronically Signed   By: Ranell Bring M.D.   On: 12/29/2023 12:40   PROCEDURES: Critical Care performed: No Procedures MEDICATIONS ORDERED IN ED: Medications  amiodarone  (PACERONE )  tablet 200 mg (200 mg Oral Given 12/29/23 1236)  apixaban  (ELIQUIS ) tablet 5 mg (5 mg Oral Given 12/29/23 1235)  clopidogrel  (PLAVIX ) tablet 75 mg (75 mg Oral Given 12/29/23 1236)  furosemide  (LASIX ) tablet 40 mg (40 mg Oral Given 12/29/23 1236)  hydrALAZINE  (APRESOLINE ) tablet 50 mg (has no administration in time range)  isosorbide  mononitrate (IMDUR ) 24 hr tablet 60 mg (60 mg Oral Given 12/29/23 1236)  metoprolol  succinate (TOPROL -XL) 24 hr tablet 50 mg (50 mg Oral Given 12/29/23 1236)   rosuvastatin  (CRESTOR ) tablet 20 mg (has no administration in time range)   IMPRESSION / MDM / ASSESSMENT AND PLAN / ED COURSE  I reviewed the triage vital signs and the nursing notes.                             The patient is on the cardiac monitor to evaluate for evidence of arrhythmia and/or significant heart rate changes. Patient's presentation is most consistent with acute presentation with potential threat to life or bodily function. Endorses dyspnea, endorses, LE edema Endorses Non adherence to medication regimen  Workup: ECG, CBC, BMP, Troponin, BNP, CXR Findings: EKG: No STEMI and no evidence of Brugada's sign, delta wave, epsilon wave, significantly prolonged QTc, or malignant arrhythmia. BNP: 308 Based on history, exam and findings, presentation most consistent with acute on chronic heart failure. Low suspicion for PNA, ACS, tamponade, aortic dissection.  Rx: Restart home meds  Disposition (Stable): Discharge    FINAL CLINICAL IMPRESSION(S) / ED DIAGNOSES   Final diagnoses:  None   Rx / DC Orders   ED Discharge Orders          Ordered    amiodarone  (PACERONE ) 200 MG tablet  Daily        12/29/23 1249    apixaban  (ELIQUIS ) 5 MG TABS tablet  2 times daily        12/29/23 1249    clopidogrel  (PLAVIX ) 75 MG tablet  Daily        12/29/23 1249    furosemide  (LASIX ) 40 MG tablet  Daily        12/29/23 1249    hydrALAZINE  (APRESOLINE ) 50 MG tablet  3 times daily       Note to Pharmacy: Dose increase   12/29/23 1249    isosorbide  mononitrate (IMDUR ) 60 MG 24 hr tablet  Daily        12/29/23 1249    metoprolol  succinate (TOPROL -XL) 50 MG 24 hr tablet  Daily        12/29/23 1249    potassium chloride  SA (KLOR-CON  M) 20 MEQ tablet  Daily        12/29/23 1249    rosuvastatin  (CRESTOR ) 20 MG tablet  Daily        12/29/23 1249           Note:  This document was prepared using Dragon voice recognition software and may include unintentional dictation errors.    Jossie Artist POUR, MD 12/29/23 1356

## 2024-07-16 ENCOUNTER — Other Ambulatory Visit: Payer: Self-pay

## 2024-07-16 ENCOUNTER — Emergency Department

## 2024-07-16 ENCOUNTER — Observation Stay
Admission: EM | Admit: 2024-07-16 | Discharge: 2024-07-18 | Disposition: A | Source: Home / Self Care | Attending: Internal Medicine | Admitting: Internal Medicine

## 2024-07-16 DIAGNOSIS — I10 Essential (primary) hypertension: Secondary | ICD-10-CM | POA: Insufficient documentation

## 2024-07-16 DIAGNOSIS — I5020 Unspecified systolic (congestive) heart failure: Secondary | ICD-10-CM

## 2024-07-16 DIAGNOSIS — I639 Cerebral infarction, unspecified: Principal | ICD-10-CM | POA: Diagnosis present

## 2024-07-16 DIAGNOSIS — E785 Hyperlipidemia, unspecified: Secondary | ICD-10-CM | POA: Insufficient documentation

## 2024-07-16 DIAGNOSIS — I480 Paroxysmal atrial fibrillation: Secondary | ICD-10-CM | POA: Insufficient documentation

## 2024-07-16 LAB — COMPREHENSIVE METABOLIC PANEL WITH GFR
ALT: 9 U/L (ref 0–44)
AST: 20 U/L (ref 15–41)
Albumin: 4.1 g/dL (ref 3.5–5.0)
Alkaline Phosphatase: 87 U/L (ref 38–126)
Anion gap: 13 (ref 5–15)
BUN: 27 mg/dL — ABNORMAL HIGH (ref 8–23)
CO2: 18 mmol/L — ABNORMAL LOW (ref 22–32)
Calcium: 9 mg/dL (ref 8.9–10.3)
Chloride: 111 mmol/L (ref 98–111)
Creatinine, Ser: 1.71 mg/dL — ABNORMAL HIGH (ref 0.61–1.24)
GFR, Estimated: 43 mL/min — ABNORMAL LOW
Glucose, Bld: 101 mg/dL — ABNORMAL HIGH (ref 70–99)
Potassium: 4.1 mmol/L (ref 3.5–5.1)
Sodium: 141 mmol/L (ref 135–145)
Total Bilirubin: 1.1 mg/dL (ref 0.0–1.2)
Total Protein: 7.4 g/dL (ref 6.5–8.1)

## 2024-07-16 LAB — PROTIME-INR
INR: 1 (ref 0.8–1.2)
Prothrombin Time: 13.8 s (ref 11.4–15.2)

## 2024-07-16 LAB — CBC
HCT: 49.6 % (ref 39.0–52.0)
Hemoglobin: 16.8 g/dL (ref 13.0–17.0)
MCH: 31.9 pg (ref 26.0–34.0)
MCHC: 33.9 g/dL (ref 30.0–36.0)
MCV: 94.3 fL (ref 80.0–100.0)
Platelets: 129 10*3/uL — ABNORMAL LOW (ref 150–400)
RBC: 5.26 MIL/uL (ref 4.22–5.81)
RDW: 13.8 % (ref 11.5–15.5)
WBC: 8.9 10*3/uL (ref 4.0–10.5)
nRBC: 0 % (ref 0.0–0.2)

## 2024-07-16 LAB — DIFFERENTIAL
Abs Immature Granulocytes: 0.07 10*3/uL (ref 0.00–0.07)
Basophils Absolute: 0 10*3/uL (ref 0.0–0.1)
Basophils Relative: 0 %
Eosinophils Absolute: 0.1 10*3/uL (ref 0.0–0.5)
Eosinophils Relative: 1 %
Immature Granulocytes: 1 %
Lymphocytes Relative: 16 %
Lymphs Abs: 1.4 10*3/uL (ref 0.7–4.0)
Monocytes Absolute: 0.7 10*3/uL (ref 0.1–1.0)
Monocytes Relative: 8 %
Neutro Abs: 6.5 10*3/uL (ref 1.7–7.7)
Neutrophils Relative %: 74 %

## 2024-07-16 LAB — APTT: aPTT: 32 s (ref 24–36)

## 2024-07-16 LAB — CBG MONITORING, ED: Glucose-Capillary: 85 mg/dL (ref 70–99)

## 2024-07-16 MED ORDER — LABETALOL HCL 5 MG/ML IV SOLN
20.0000 mg | INTRAVENOUS | Status: DC | PRN
  Administered 2024-07-16: 20 mg via INTRAVENOUS
  Filled 2024-07-16: qty 4

## 2024-07-16 NOTE — ED Notes (Signed)
 CCMD called to admit patient to monitor

## 2024-07-16 NOTE — ED Triage Notes (Signed)
 Patient to ED via [facility] from home. Family called out due to patient experiencing aphasia. Patient's LKW was yesterday (2/3) at 0800. Patient does have Hx of stroke. Patient also has PMH of an MI and HTN. Patient continues to have slurred speech and complains of blurred vision. EMS Vitals: 211/117 65 HR 98% room air  CBG 115

## 2024-07-16 NOTE — ED Provider Notes (Signed)
 "  [facility] Provider Note    Event Date/Time   First MD Initiated Contact with Patient 07/16/24 1959     (approximate)   History   Aphasia   HPI  Gordon [name] is a 69 y.o. male who presents to the emergency department today accompanied by family because of concerns for altered speech and confusion.  Family member says that they last saw him yesterday morning when he was in his normal state of health.  When they got home from work yesterday evening he did have some confusion.  This afternoon when they saw him again he was significantly more confused and was having a difficult time with his words.  He does have history of stroke roughly 6 years ago. Is on eliquis .      Physical Exam   Triage Vital Signs: ED Triage Vitals  Encounter Vitals Group     BP 07/16/24 1954 (!) 204/95     Girls Systolic BP Percentile --      Girls Diastolic BP Percentile --      Boys Systolic BP Percentile --      Boys Diastolic BP Percentile --      Pulse Rate 07/16/24 1954 83     Resp 07/16/24 1954 20     Temp 07/16/24 1954 97.6 F (36.4 C)     Temp Source 07/16/24 1954 Oral     SpO2 07/16/24 1949 98 %     Weight 07/16/24 1959 149 lb 11.1 oz (67.9 kg)     Height 07/16/24 1959 5' 6 (1.676 m)     Head Circumference --      Peak Flow --      Pain Score 07/16/24 1959 0     Pain Loc --      Pain Education --      Exclude from Growth Chart --     Most recent vital signs: Vitals:   07/16/24 1949 07/16/24 1954  BP:  (!) 204/95  Pulse:  83  Resp:  20  Temp:  97.6 F (36.4 C)  SpO2: 98% 100%   General: Awake, alert, aphasic CV:  Good peripheral perfusion. Irregular rhythm. Resp:  Normal effort. Lungs clear. Abd:  No distention.  Other:  Face symmetric. Slight left deviation of tongue. Strength 5/5 in upper extremities, 4+/5 in bilateral lower extremities. Sensation grossly intact. Slurred speech.   ED Results / Procedures / Treatments   Labs (all labs ordered  are listed, but only abnormal results are displayed) Labs Reviewed  CBC - Abnormal; Notable for the following components:      Result Value   Platelets 129 (*)    All other components within normal limits  DIFFERENTIAL  PROTIME-INR  APTT  COMPREHENSIVE METABOLIC PANEL WITH GFR  CBG MONITORING, ED  CBG MONITORING, ED     EKG  I, Guadalupe Eagles, attending physician, personally viewed and interpreted this EKG  EKG Time: 1957 Rate: 76 Rhythm: atrial fibrillation Axis: normal Intervals: qtc 472 QRS: non specific IVCD ST changes: no st elevation Impression: abnormal ekg   RADIOLOGY I independently interpreted and visualized the CT head. My interpretation: bilateral strokes.  Radiology interpretation: IMPRESSION:  1. Acute nonhemorrhagic infarct in the right parietooccipital area with partial  extension into the posterior insula, involving portions of the posterior right  MCA and posterior cerebral artery territory.  2. Encephalomalacia and gliosis in the high left frontoparietal area from a  remote left MCA infarct.  3. Atrophy, small vessel changes,  and vascular calcifications.     PROCEDURES:  Critical Care performed: No    MEDICATIONS ORDERED IN ED: Medications - No data to display   IMPRESSION / MDM / ASSESSMENT AND PLAN / ED COURSE  I reviewed the triage vital signs and the nursing notes.                              Differential diagnosis includes, but is not limited to, CVA, TIA, electrolyte abnormality, infection  Patient's presentation is most consistent with acute presentation with potential threat to life or bodily function.   Patient presented to the emergency department today because of concerns for confusion and difficulty with speech.  Last known well yesterday morning.  On exam patient is aphasic.  CT scan is concerning for acute stroke.  Discussed this with patient and family.  Will discuss with hospitalist for admission.   FINAL CLINICAL  IMPRESSION(S) / ED DIAGNOSES   Final diagnoses:  Cerebrovascular accident (CVA), unspecified mechanism (HCC)      Note:  This document was prepared using Dragon voice recognition software and may include unintentional dictation errors.    Floy Roberts, MD 07/16/24 2255  "

## 2024-07-17 ENCOUNTER — Observation Stay: Admit: 2024-07-17 | Discharge: 2024-07-17 | Disposition: A | Attending: Family Medicine

## 2024-07-17 ENCOUNTER — Observation Stay

## 2024-07-17 DIAGNOSIS — R29705 NIHSS score 5: Secondary | ICD-10-CM | POA: Diagnosis not present

## 2024-07-17 DIAGNOSIS — I480 Paroxysmal atrial fibrillation: Secondary | ICD-10-CM | POA: Insufficient documentation

## 2024-07-17 DIAGNOSIS — Z7902 Long term (current) use of antithrombotics/antiplatelets: Secondary | ICD-10-CM | POA: Diagnosis not present

## 2024-07-17 DIAGNOSIS — I6340 Cerebral infarction due to embolism of unspecified cerebral artery: Secondary | ICD-10-CM | POA: Diagnosis not present

## 2024-07-17 DIAGNOSIS — I4891 Unspecified atrial fibrillation: Secondary | ICD-10-CM | POA: Diagnosis not present

## 2024-07-17 DIAGNOSIS — I69320 Aphasia following cerebral infarction: Secondary | ICD-10-CM | POA: Diagnosis not present

## 2024-07-17 DIAGNOSIS — E785 Hyperlipidemia, unspecified: Secondary | ICD-10-CM | POA: Insufficient documentation

## 2024-07-17 DIAGNOSIS — I10 Essential (primary) hypertension: Secondary | ICD-10-CM | POA: Insufficient documentation

## 2024-07-17 LAB — LIPID PANEL
Cholesterol: 158 mg/dL (ref 0–200)
HDL: 31 mg/dL — ABNORMAL LOW
LDL Cholesterol: 112 mg/dL — ABNORMAL HIGH (ref 0–99)
Total CHOL/HDL Ratio: 5.1 ratio
Triglycerides: 75 mg/dL
VLDL: 15 mg/dL (ref 0–40)

## 2024-07-17 LAB — BASIC METABOLIC PANEL WITH GFR
Anion gap: 12 (ref 5–15)
BUN: 24 mg/dL — ABNORMAL HIGH (ref 8–23)
CO2: 17 mmol/L — ABNORMAL LOW (ref 22–32)
Calcium: 8.1 mg/dL — ABNORMAL LOW (ref 8.9–10.3)
Chloride: 111 mmol/L (ref 98–111)
Creatinine, Ser: 1.57 mg/dL — ABNORMAL HIGH (ref 0.61–1.24)
GFR, Estimated: 47 mL/min — ABNORMAL LOW
Glucose, Bld: 86 mg/dL (ref 70–99)
Potassium: 3.8 mmol/L (ref 3.5–5.1)
Sodium: 140 mmol/L (ref 135–145)

## 2024-07-17 LAB — ECHOCARDIOGRAM COMPLETE BUBBLE STUDY
S' Lateral: 3.4 cm
Single Plane A4C EF: 43.1 %

## 2024-07-17 LAB — CBC
HCT: 45.4 % (ref 39.0–52.0)
Hemoglobin: 15.3 g/dL (ref 13.0–17.0)
MCH: 32 pg (ref 26.0–34.0)
MCHC: 33.7 g/dL (ref 30.0–36.0)
MCV: 95 fL (ref 80.0–100.0)
Platelets: 120 10*3/uL — ABNORMAL LOW (ref 150–400)
RBC: 4.78 MIL/uL (ref 4.22–5.81)
RDW: 14 % (ref 11.5–15.5)
WBC: 8.4 10*3/uL (ref 4.0–10.5)
nRBC: 0 % (ref 0.0–0.2)

## 2024-07-17 LAB — HEMOGLOBIN A1C
Hgb A1c MFr Bld: 5.2 % (ref 4.8–5.6)
Mean Plasma Glucose: 102.54 mg/dL

## 2024-07-17 LAB — HIV ANTIBODY (ROUTINE TESTING W REFLEX): HIV Screen 4th Generation wRfx: NONREACTIVE

## 2024-07-17 MED ORDER — METOPROLOL SUCCINATE ER 50 MG PO TB24
50.0000 mg | ORAL_TABLET | Freq: Every day | ORAL | Status: DC
  Administered 2024-07-18: 50 mg via ORAL
  Filled 2024-07-17: qty 1

## 2024-07-17 MED ORDER — ONDANSETRON HCL 4 MG/2ML IJ SOLN: 4.0000 mg | Freq: Four times a day (QID) | INTRAMUSCULAR | Status: DC | PRN

## 2024-07-17 MED ORDER — POTASSIUM CHLORIDE CRYS ER 20 MEQ PO TBCR
20.0000 meq | EXTENDED_RELEASE_TABLET | Freq: Every day | ORAL | Status: DC
  Administered 2024-07-18: 20 meq via ORAL
  Filled 2024-07-17: qty 1

## 2024-07-17 MED ORDER — STROKE: EARLY STAGES OF RECOVERY BOOK: Freq: Once | Status: AC

## 2024-07-17 MED ORDER — APIXABAN 5 MG PO TABS: 5.0000 mg | ORAL_TABLET | Freq: Two times a day (BID) | ORAL | Status: DC

## 2024-07-17 MED ORDER — TRAZODONE HCL 50 MG PO TABS: 25.0000 mg | ORAL_TABLET | Freq: Every evening | ORAL | Status: DC | PRN

## 2024-07-17 MED ORDER — CLOPIDOGREL BISULFATE 75 MG PO TABS
75.0000 mg | ORAL_TABLET | Freq: Every day | ORAL | Status: DC
  Administered 2024-07-18: 75 mg via ORAL
  Filled 2024-07-17: qty 1

## 2024-07-17 MED ORDER — MAGNESIUM HYDROXIDE 400 MG/5ML PO SUSP: 30.0000 mL | Freq: Every day | ORAL | Status: DC | PRN

## 2024-07-17 MED ORDER — SODIUM CHLORIDE 0.9 % IV SOLN: INTRAVENOUS | Status: DC

## 2024-07-17 MED ORDER — HYDRALAZINE HCL 50 MG PO TABS
50.0000 mg | ORAL_TABLET | Freq: Three times a day (TID) | ORAL | Status: DC
  Administered 2024-07-17 – 2024-07-18 (×2): 50 mg via ORAL
  Filled 2024-07-17 (×2): qty 1

## 2024-07-17 MED ORDER — ONDANSETRON HCL 4 MG PO TABS: 4.0000 mg | ORAL_TABLET | Freq: Four times a day (QID) | ORAL | Status: DC | PRN

## 2024-07-17 MED ORDER — AMIODARONE HCL 200 MG PO TABS
200.0000 mg | ORAL_TABLET | Freq: Every day | ORAL | Status: DC
  Administered 2024-07-18: 200 mg via ORAL
  Filled 2024-07-17: qty 1

## 2024-07-17 MED ORDER — IOHEXOL 350 MG/ML SOLN
75.0000 mL | Freq: Once | INTRAVENOUS | Status: AC | PRN
  Administered 2024-07-17: 75 mL via INTRAVENOUS

## 2024-07-17 MED ORDER — ACETAMINOPHEN 650 MG RE SUPP: 650.0000 mg | Freq: Four times a day (QID) | RECTAL | Status: DC | PRN

## 2024-07-17 MED ORDER — ACETAMINOPHEN 325 MG PO TABS: 650.0000 mg | ORAL_TABLET | Freq: Four times a day (QID) | ORAL | Status: DC | PRN

## 2024-07-17 MED ORDER — FUROSEMIDE 40 MG PO TABS
40.0000 mg | ORAL_TABLET | Freq: Every day | ORAL | Status: DC
  Administered 2024-07-18: 40 mg via ORAL
  Filled 2024-07-17: qty 1

## 2024-07-17 MED ORDER — ROSUVASTATIN CALCIUM 20 MG PO TABS
20.0000 mg | ORAL_TABLET | Freq: Every day | ORAL | Status: DC
  Filled 2024-07-17: qty 1

## 2024-07-17 MED ORDER — ISOSORBIDE MONONITRATE ER 60 MG PO TB24
60.0000 mg | ORAL_TABLET | Freq: Every day | ORAL | Status: DC
  Administered 2024-07-18: 60 mg via ORAL
  Filled 2024-07-17: qty 2

## 2024-07-17 MED ORDER — ROSUVASTATIN CALCIUM 20 MG PO TABS
40.0000 mg | ORAL_TABLET | Freq: Every day | ORAL | Status: DC
  Administered 2024-07-18: 40 mg via ORAL
  Filled 2024-07-17 (×2): qty 2

## 2024-07-17 NOTE — Assessment & Plan Note (Addendum)
-   This is manifested by expressive dysphasia and confusion. - The patient will be admitted to an observation medically monitored bed.   - We will follow neuro checks q.4 hours for 24 hours.   - The patient will be continued on Eliquis  and Plavix . - Will obtain a brain MRI without contrast as well as head and neck CTA and 2D echo with bubble study .   - A neurology consultation  as well as physical/occupation/speech therapy consults will be obtained in a.m.SABRA - I notified Dr. Arora about the patient. - The patient will be placed on statin therapy and fasting lipids will be checked.

## 2024-07-17 NOTE — Progress Notes (Signed)
 " PROGRESS NOTE    Darold Miley  FMW:[id number] DOB: 05-Nov-1955 DOA: 07/16/2024 PCP: Donnie Handing, PA    Brief Narrative:  69 y.o. Caucasian male with medical history significant for coronary artery disease, combined systolic and diastolic CHF, stage III CKD, type 2 diabetes mellitus, hypertension and dyslipidemia as well as paroxysmal atrial fibrillation on Eliquis  and CVA, who presented to the ER with acute onset of altered mental status with confusion as well as expressive dysphasia.  He was last seen yesterday morning in his normal state of health.  No chest pain or palpitations.  No cough or wheezing or dyspnea.  No fever or chills.  No nausea or vomiting or diarrhea or abdominal pain.  No dysuria, oliguria or hematuria or flank pain.     Assessment & Plan:   Principal Problem:   Acute CVA (cerebrovascular accident) [facility]) Active Problems:   Essential hypertension   Dyslipidemia   Paroxysmal atrial fibrillation (HCC)  Acute CVA Cerebrovascular disease Manifested by expressive aphasia and confusion.  Last known well 2/3.  CT angiography with advanced atherosclerotic disease.  Near occlusive stenosis of right MCA M2 branch.  Likely etiology of stroke is cardioembolic.  Echocardiogram completed and results are reassuring.  Ejection fraction 35 to 40% actually improved from prior Plan: Continue Plavix  Hold Eliquis  until at least 2/8 Crestor  40 mg daily Telemetry monitoring Repeat brain imaging for any acute neurologic changes Tentative plan discharge on 2/6 Outpatient neurology follow-up 8-12 Outpatient cardiology follow-up 6 to 8 weeks SLP evaluation for cognition and language evaluation   Paroxysmal atrial fibrillation (HCC) Currently rate controlled Continue amiodarone  and metoprolol  Hold Eliquis  as above   Dyslipidemia Crestor  40 mg daily   Essential hypertension Normotensive BP goals Continue current regimen     DVT prophylaxis: SQL Code Status: Full Family  Communication: Niece at bedside 2/5 Disposition Plan: Status is: Inpatient Remains inpatient appropriate because: Acute CVA with residual cognition deficits   Level of care: Telemetry  Consultants:  Neurology  Procedures:  None  Antimicrobials: None   Subjective: Seen and examined.  Resting in bed.  Appears mildly confused.  Niece at bedside.  Objective: Vitals:   07/17/24 0600 07/17/24 0926 07/17/24 1351 07/17/24 1354  BP: (!) 137/93 138/86  133/84  Pulse: (!) 25 62  61  Resp: 19 15  16   Temp:  98 F (36.7 C) 98.1 F (36.7 C) 98.2 F (36.8 C)  TempSrc:   Oral Oral  SpO2: 100% 96%  95%  Weight:      Height:        Intake/Output Summary (Last 24 hours) at 07/17/2024 1458 Last data filed at 07/17/2024 9157 Gross per 24 hour  Intake --  Output 200 ml  Net -200 ml   Filed Weights   07/16/24 1959  Weight: 67.9 kg    Examination:  General exam: NAD Respiratory system: Lungs clear.  Normal work of breathing.  Room air Cardiovascular system: S1-2, RRR, no murmurs, no pedal edema Gastrointestinal system: Soft, NT/ND, normal bowel sounds Central nervous system: Alert, oriented x 2, no focal deficits Extremities: Symmetric 5 x 5 power. Skin: No rashes, lesions or ulcers Psychiatry: Judgement and insight appear impaired. Mood & affect confused.     Data Reviewed: I have personally reviewed following labs and imaging studies  CBC: Recent Labs  Lab 07/16/24 1953 07/17/24 0500  WBC 8.9 8.4  NEUTROABS 6.5  --   HGB 16.8 15.3  HCT 49.6 45.4  MCV 94.3 95.0  PLT 129*  120*   Basic Metabolic Panel: Recent Labs  Lab 07/16/24 1953 07/17/24 0500  NA 141 140  K 4.1 3.8  CL 111 111  CO2 18* 17*  GLUCOSE 101* 86  BUN 27* 24*  CREATININE 1.71* 1.57*  CALCIUM  9.0 8.1*   GFR: Estimated Creatinine Clearance: 40.1 mL/min (A) (by C-G formula based on SCr of 1.57 mg/dL (H)). Liver Function Tests: Recent Labs  Lab 07/16/24 1953  AST 20  ALT 9  ALKPHOS 87   BILITOT 1.1  PROT 7.4  ALBUMIN 4.1   No results for input(s): LIPASE, AMYLASE in the last 168 hours. No results for input(s): AMMONIA in the last 168 hours. Coagulation Profile: Recent Labs  Lab 07/16/24 1953  INR 1.0   Cardiac Enzymes: No results for input(s): CKTOTAL, CKMB, CKMBINDEX, TROPONINI in the last 168 hours. BNP (last 3 results) No results for input(s): PROBNP in the last 8760 hours. HbA1C: Recent Labs    07/17/24 0055  HGBA1C 5.2   CBG: Recent Labs  Lab 07/16/24 1955  GLUCAP 85   Lipid Profile: Recent Labs    07/17/24 0500  CHOL 158  HDL 31*  LDLCALC 112*  TRIG 75  CHOLHDL 5.1   Thyroid Function Tests: No results for input(s): TSH, T4TOTAL, FREET4, T3FREE, THYROIDAB in the last 72 hours. Anemia Panel: No results for input(s): VITAMINB12, FOLATE, FERRITIN, TIBC, IRON, RETICCTPCT in the last 72 hours. Sepsis Labs: No results for input(s): PROCALCITON, LATICACIDVEN in the last 168 hours.  No results found for this or any previous visit (from the past 240 hours).       Radiology Studies: ECHOCARDIOGRAM COMPLETE BUBBLE STUDY Result Date: 07/17/2024    ECHOCARDIOGRAM REPORT   Patient Name:   KEIANDRE Leitch Date of Exam: 07/17/2024 Medical Rec #:  [id number]    Height:       66.0 in Accession #:    [id number]   Weight:       149.7 lb Date of Birth:  [birth year]     BSA:          1.768 m Patient Age:    68 years     BP:           143/94 mmHg Patient Gender: M            HR:           56 bpm. Exam Location:  [facility] Procedure: 2D Echo, Cardiac Doppler and Color Doppler (Both Spectral and Color            Flow Doppler were utilized during procedure). Indications:     Stroke  History:         Patient has prior history of Echocardiogram examinations. CHF;                  STEMI.  Sonographer:     Rainelle Gull Referring Phys:  [id number] JAN A MANSY Diagnosing Phys: Dwayne D Callwood MD  Sonographer Comments: Image acquisition  challenging due to patient behavioral factors. IMPRESSIONS  1. Left ventricular ejection fraction, by estimation, is 40 to 45%. The left ventricle has mildly decreased function. The left ventricle has no regional wall motion abnormalities. There is mild left ventricular hypertrophy. Left ventricular diastolic function could not be evaluated.  2. Right ventricular systolic function is normal. The right ventricular size is normal.  3. The mitral valve is normal in structure. No evidence of mitral valve regurgitation.  4. The aortic valve is grossly normal. Aortic valve regurgitation  is not visualized. FINDINGS  Left Ventricle: Left ventricular ejection fraction, by estimation, is 40 to 45%. The left ventricle has mildly decreased function. The left ventricle has no regional wall motion abnormalities. Strain was performed and the global longitudinal strain is indeterminate. The left ventricular internal cavity size was normal in size. There is mild left ventricular hypertrophy. Left ventricular diastolic function could not be evaluated. Right Ventricle: The right ventricular size is normal. No increase in right ventricular wall thickness. Right ventricular systolic function is normal. Left Atrium: Left atrial size was normal in size. Right Atrium: Right atrial size was normal in size. Pericardium: There is no evidence of pericardial effusion. Mitral Valve: The mitral valve is normal in structure. No evidence of mitral valve regurgitation. Tricuspid Valve: The tricuspid valve is normal in structure. Tricuspid valve regurgitation is not demonstrated. Aortic Valve: The aortic valve is grossly normal. Aortic valve regurgitation is not visualized. Pulmonic Valve: The pulmonic valve was not well visualized. Pulmonic valve regurgitation is not visualized. Aorta: The aortic root was not well visualized. IAS/Shunts: No atrial level shunt detected by color flow Doppler. Additional Comments: 3D was performed not requiring image  post processing on an independent workstation and was indeterminate.  LEFT VENTRICLE PLAX 2D LVIDd:         4.20 cm LVIDs:         3.40 cm LV PW:         1.20 cm LV IVS:        1.20 cm LVOT diam:     1.90 cm LVOT Area:     2.84 cm  LV Volumes (MOD) LV vol d, MOD A4C: 87.7 ml LV vol s, MOD A4C: 49.9 ml LV SV MOD A4C:     87.7 ml RIGHT VENTRICLE RV Basal diam:  3.00 cm RV Mid diam:    2.20 cm LEFT ATRIUM           Index        RIGHT ATRIUM           Index LA Vol (A4C): 72.0 ml 40.72 ml/m  RA Area:     17.00 cm                                    RA Volume:   45.10 ml  25.51 ml/m   AORTA Ao Root diam: 2.90 cm Ao Asc diam:  3.70 cm  SHUNTS Systemic Diam: 1.90 cm Cara JONETTA Lovelace MD Electronically signed by Cara JONETTA Lovelace MD Signature Date/Time: 07/17/2024/12:55:15 PM    Final    MR BRAIN WO CONTRAST Result Date: 07/17/2024 EXAM: MRI BRAIN WITHOUT CONTRAST 07/17/2024 11:38:59 AM TECHNIQUE: Multiplanar multisequence MRI of the head/brain was performed without the administration of intravenous contrast. COMPARISON: CT head 07/16/2024 and CTA head and neck 07/17/2024. CLINICAL HISTORY: Stroke, follow up. FINDINGS: BRAIN AND VENTRICLES: There is restricted diffusion within the right MCA territory primarily involving the posterior and superior aspects of the right temporal lobe extending into the inferior right parietal lobe. There is mild associated edema and sulcal effacement with mild mass effect on the posterior right temporal horn. No midline shift. Encephalomalacia and gliosis are present in the left parietal lobe, compatible with remote infarct. T2 and FLAIR hyperintensity is noted in the periventricular and subcortical white matter, compatible with chronic microvascular ischemic changes. There is mild age-related parenchymal volume loss. A small remote infarct is seen in the  right cerebellum. No intracranial hemorrhage. No mass. No hydrocephalus. The sella is unremarkable. Normal flow voids. ORBITS: No  significant abnormality. SINUSES AND MASTOIDS: Mucosal thickening is present in the left sphenoid sinus. The mastoids show no significant abnormality. BONES AND SOFT TISSUES: Normal marrow signal. No soft tissue abnormality. IMPRESSION: 1. Acute right MCA territory infarct with mild associated edema and sulcal effacement. No midline shift. 2. Encephalomalacia and gliosis in the left parietal lobe compatible with remote infarct. 3. Small remote infarct in the right cerebellum. Electronically signed by: Donnice Mania MD 07/17/2024 11:49 AM EST RP Workstation: [id number]   CT ANGIO HEAD NECK W WO CM Result Date: 07/17/2024 EXAM: CTA HEAD AND NECK WITH AND WITHOUT 07/17/2024 05:00:19 AM TECHNIQUE: CTA of the head and neck was performed with and without the administration of 75 mL of intravenous contrast (iohexol  (OMNIPAQUE ) 350 MG/ML injection 75 mL IOHEXOL  350 MG/ML SOLN). Multiplanar 2D and/or 3D reformatted images are provided for review. Automated exposure control, iterative reconstruction, and/or weight based adjustment of the mA/kV was utilized to reduce the radiation dose to as low as reasonably achievable. Stenosis of the internal carotid arteries measured using NASCET criteria. COMPARISON: CT head 07/16/2024 and earlier study. CLINICAL HISTORY: 69 year old male with evidence of recent right MCA infarct on plain head CT yesterday. stroke/transient ischemic attack, determine embolic source. FINDINGS: CTA NECK: AORTIC ARCH AND ARCH VESSELS: Soft and calcified aortic arch atherosclerosis. 4-vessel arch configuration. The left vertebral artery arises directly from the arch (normal variant). No dissection or arterial injury. Brachiocephalic artery and right common carotid artery (CCA) calcified atherosclerosis without stenosis. Proximal right subclavian artery calcified atherosclerosis without stenosis. Soft and calcified atherosclerosis of the dominant right vertebral artery origin resulting in moderate stenosis  (series 8 image 73). Nondominant left vertebral artery arises directly from the aortic arch with moderate to severe atherosclerosis and stenosis of its origin (series 7 image 176). The vessel remains patent. The left vertebral artery is diminutive but patent to the skull base. CERVICAL CAROTID ARTERIES: Retropharyngeal course of both carotid arteries, normal variant. Right carotid bifurcation, right internal carotid artery (ICA) origin and bulb soft and calcified plaque. Proximal right ICA 57% stenosis. Left common carotid artery (CCA) soft and calcified atherosclerosis without stenosis. Bulky calcified atherosclerosis at the left carotid bifurcation and proximal left ICA. Proximal left ICA 63% stenosis. Left ICA remains patent. No dissection or arterial injury. CERVICAL VERTEBRAL ARTERIES: Additional right V1 and proximal V2 segment sclerosis with mild stenosis. Dominant right vertebral artery V4 segment calcified atherosclerosis results in mild right V4 segment stenosis. The left vertebral artery is diminutive but patent to the skull base. The left V4 segment is also severely atherosclerotic and irregular but the left posterior inferior cerebellar artery (PICA) origin and left vertebrobasilar junction remain patent (series 6 image 153). No dissection or arterial injury. LUNGS AND MEDIASTINUM: Negative visible superior mediastinum and lungs. SOFT TISSUES: Port in tissue. Other nonvascular neck soft tissue spaces appear negative. BONES: There is a large left of midline maxillary alveolus 17 mm area of bone erosion which appears likely to be odontogenic and inflammatory (series 4 image 89). No acute or suspicious osseous abnormality otherwise. CTA HEAD: ANTERIOR CIRCULATION: Right internal carotid artery (ICA) siphon is moderately to severely calcified with associated moderate supraclinoid stenosis on series 6 image 116 of the distal right ICA. Left ICA siphon is moderately to severely calcified and there is moderate  stenosis at both the distal cavernous (series 6 image 124) and supraclinoid (series 6 image  116) segments. Normal right posterior communicating and right ophthalmic artery origins. Normal left ophthalmic artery origin. Normal middle cerebral artery (MCA) and anterior cerebral artery (ACA) origins. Dominant left and diminutive right ACA A1 segments. Normal anterior communicating artery. Severe left ACA stenosis at the distal A2 / A3 segment (series 10 image 15). Distal ACA branches remain patent. No left MCA stenosis. Right MCA M1 segment and right MCA bifurcation is patent. Right MCA posterior M2 branch is normal and nearly occluded (series 8 image 60) along a segment of about 9 mm. There is some preserved downstream enhancement. No other right MCA branch occlusion. No aneurysm. POSTERIOR CIRCULATION: Fetal right posterior cerebral artery (PCA) origin. Normal superior cerebellar artery (SCA) and left PCA origins. Mild to moderate left PCA P2 and P3 segment irregularity and stenosis (series 11 image 24). Similar mild more so than moderate right PCA distal P2 and P3 irregularity and stenosis (series 11 image 18). Patent basilar artery with mild irregularity but no stenosis. The left V4 segment is also severely atherosclerotic and irregular but the left PICA origin and left vertebrobasilar junction remain patent (series 6 image 153). Dominant right vertebral artery V4 segment calcified atherosclerosis results in mild right V4 segment stenosis. Normal right PICA origin. No aneurysm. OTHER: Superior sagittal sinus is enhancing and patent. IMPRESSION: 1. Positive for advanced Atherosclerosis with Near-Occlusion of posterior RMCA M2 branch (series 8 image 60), otherwise no LVO. 2. Severe atherosclerotic stenoses: Nondominant left vertebral artery origin and left V4 segments, left ACA A2/A3 junction. Moderate stenoses: Bilateral ICA siphons, dominant right vertebral artery origin. 3. Bilateral carotid bifurcation  atherosclerosis with proximal ICA stenoses 63% on the left and 57% on the right. Aortic Atherosclerosis (ICD10-I70.0). Electronically signed by: Helayne Hurst MD 07/17/2024 05:20 AM EST RP Workstation: [id number]   CT HEAD WO CONTRAST Result Date: 07/16/2024 EXAM: CT HEAD WITHOUT CONTRAST 07/16/2024 08:41:11 PM TECHNIQUE: CT of the head was performed without the administration of intravenous contrast. Automated exposure control, iterative reconstruction, and/or weight based adjustment of the mA/kV was utilized to reduce the radiation dose to as low as reasonably achievable. COMPARISON: Head CT from 05/23/2019. CLINICAL HISTORY: Neuro deficit, acute, stroke suspected. Acute neurologic deficit; stroke suspected. FINDINGS: BRAIN AND VENTRICLES: There is new parenchymal hypodensity, sulcal effacement, swelling and loss of gray-white matter differentiation consistent with an acute nonhemorrhagic infarct in the right parietooccipital area with partial extension into the posterior insula. The infarct measures 5.1 cm AP, 4.8 cm transverse, and 3.6 cm craniocaudal. Portions of the posterior right MCA territory and posterior cerebral artery territory are involved. There is encephalomalacia and gliosis in the high left frontoparietal area from a remote left MCA infarct. Mild atrophy and small vessel disease. Mild atrophic ventriculomegaly without midline shift or downward mass effect. No acute hemorrhage. No other acute infarct is seen. There are chronic small vessel changes in the pons. Brainstem and cerebellum show no acute abnormality. There is calcification in the distal vertebral arteries and siphons without an appreciable hyperdense vessel. No extra-axial collection. No mass effect or midline shift. ORBITS: No acute abnormality. SINUSES: Chronic membrane thickening and hyperostosis in the left sphenoid air cell. Other visualized sinuses and mastoids are clear. SOFT TISSUES AND SKULL: No acute soft tissue abnormality. No  skull fracture. IMPRESSION: 1. Acute nonhemorrhagic infarct in the right parietooccipital area with partial extension into the posterior insula, involving portions of the posterior right MCA and posterior cerebral artery territory. 2. Encephalomalacia and gliosis in the high left frontoparietal area from a  remote left MCA infarct. 3. Atrophy, small vessel changes, and vascular calcifications. 4. Discussed over the phone with Doctor Floy at 8:56 p.m., 07/16/2024, with verbal acknowledgement of findings. Electronically signed by: Francis Quam MD 07/16/2024 09:07 PM EST RP Workstation: [id number]        Scheduled Meds:  [START ON 07/18/2024]  stroke: early stages of recovery book   Does not apply Once   amiodarone   200 mg Oral Daily   clopidogrel   75 mg Oral Daily   furosemide   40 mg Oral Daily   hydrALAZINE   50 mg Oral TID   isosorbide  mononitrate  60 mg Oral Daily   metoprolol  succinate  50 mg Oral Daily   potassium chloride  SA  20 mEq Oral Daily   rosuvastatin   40 mg Oral Daily   Continuous Infusions:  sodium chloride  75 mL/hr at 07/17/24 0116     LOS: 0 days     Calvin KATHEE Robson, MD [facility]   If 7PM-7AM, please contact night-coverage  07/17/2024, 2:58 PM   "

## 2024-07-17 NOTE — Plan of Care (Signed)
  Problem: Ischemic Stroke/TIA Tissue Perfusion: Goal: Complications of ischemic stroke/TIA will be minimized Outcome: Progressing   Problem: Coping: Goal: Will verbalize positive feelings about self Outcome: Progressing   Problem: Self-Care: Goal: Ability to communicate needs accurately will improve Outcome: Progressing

## 2024-07-17 NOTE — Evaluation (Signed)
 Clinical/Bedside Swallow Evaluation Patient Details  Name: Gordon [name] MRN: [id number] Date of Birth: 09/06/1955  Today's Date: 07/17/2024 Time: SLP Start Time (ACUTE ONLY): 1020 SLP Stop Time (ACUTE ONLY): 1100 SLP Time Calculation (min) (ACUTE ONLY): 40 min  Past Medical History:  Past Medical History:  Diagnosis Date   CAD (coronary artery disease)    a. 05/2019 Inf STEMI/Cath: LM nl, LAD 30ost/p/m, 47m, LCX mild diff dzs, RCA 100p/m (unsuccessful PCI), RPDA 90 (filled via L->R collats). EF: 30%.   Chronic combined systolic (congestive) and diastolic (congestive) heart failure (HCC)    a. 05/2019 Echo: EF 30-35%, Gr2 DD, mod red RV fxn, mild RAE, Mild MR, mild to mod TR, mild to mod Ao sclerosis, mildly elev PASP.   CKD (chronic kidney disease), stage III (HCC)    Current smoker    Diabetes mellitus (HCC)    A1C 6.1 (05/2019)   Hyperlipidemia LDL goal <70    Hypertension    Inferior MI (HCC)    05/2019   Ischemic cardiomyopathy    a. 05/2019 Echo: EF 30-35%.   Paroxysmal atrial fibrillation (HCC)    a. 05/2019 with CVA at admission. CHADS2VASc 5 (CHF, DM2, HTN, strokex2)-->Eliquis .   Stroke (cerebrum) (HCC)    a. 05/2019 MRI: mod-sized acute cortical/subcortical infarct w/in L MCA territory. Sm amt of petechial hemorrhage w/in posterior L insula.   Past Surgical History:  Past Surgical History:  Procedure Laterality Date   CARDIAC CATHETERIZATION     CORONARY BALLOON ANGIOPLASTY N/A 05/19/2019   Procedure: CORONARY BALLOON ANGIOPLASTY;  Surgeon: Darron Deatrice LABOR, MD;  Location: [facility] INVASIVE CV LAB;  Service: Cardiovascular;  Laterality: N/A;   CORONARY/GRAFT ACUTE MI REVASCULARIZATION N/A 05/19/2019   Procedure: Coronary/Graft Acute MI Revascularization;  Surgeon: Darron Deatrice LABOR, MD;  Location: [facility] INVASIVE CV LAB;  Service: Cardiovascular;  Laterality: N/A;   LEFT HEART CATH AND CORONARY ANGIOGRAPHY N/A 05/19/2019   Procedure: LEFT HEART CATH AND CORONARY ANGIOGRAPHY;   Surgeon: Darron Deatrice LABOR, MD;  Location: [facility] INVASIVE CV LAB;  Service: Cardiovascular;  Laterality: N/A;   HPI:  Per chart notes, pt is a 69 y.o. Caucasian male with medical history significant for Prior CVA w/ Aphasia and Apraxia(2020), coronary artery disease, combined systolic and diastolic CHF, stage III CKD, type 2 diabetes mellitus, hypertension and dyslipidemia as well as paroxysmal atrial fibrillation on Eliquis , who presented to the ER with acute onset of altered mental status with confusion as well as expressive language deficits.  He was last seen yesterday morning in his normal state of health.  No chest pain or palpitations.  No cough or wheezing or dyspnea.  No fever or chills.  No nausea or vomiting or diarrhea or abdominal pain.   MRI: Acute Right MCA territory infarct with mild associated edema and sulcal  effacement. No midline shift.  2. Encephalomalacia and gliosis in the Left Parietal lobe compatible with  remote infarct.  3. Small remote infarct in the right cerebellum.    Assessment / Plan / Recommendation  Clinical Impression   Pt seen for BSE this morning per NSG request; pt NPO. Pt awake, assisted in sitting up in bed w/ Verbal/Tactile Cues -- pt min+ Impulsive. Pt was verbal and attempted answering general questions re: self and drink preferences, however, Apraxia and Aphasia noted which impacted comprehension of speech -- pt has underlying Aphasia/Apraxia Dx'd s/p previous CVA in 2020(see chart notes). Impulsive due to frustrations noted by other therapies - baseline? Niece present - pt lives  w/ Niece. Pt currently has worse speech per the Niece -- ST services will f/u w/ pt's Cognitive-Language evaluation tomorrow for any needs.  On RA, afebrile. WBC WNL.  Pt appears to present w/ functional oropharyngeal phase swallowing w/ No oropharyngeal phase dysphagia noted, No sensorimotor deficits noted. Pt consumed po trials w/ No overt, clinical s/s of aspiration during the po  trials.  Pt appears at reduced risk for aspiration following general aspiration precautions. However, pt does have challenging factors that could impact oropharyngeal swallowing to include: impact from Acute illness/hospitalization- new stroke event; Baseline cognitive-linguistic deficits; need for setup and monitoring at meals. These factors can increase risk for dysphagia as well as decreased oral intake overall.   During po trials, pt consumed all consistencies w/ no overt coughing, decline in vocal quality, or change in respiratory presentation during/post trials. O2 sats remained 98-100% during po's. Oral phase appeared grossly [facility] w/ timely bolus management, mastication, and control of bolus propulsion for A-P transfer for swallowing. Oral clearing achieved w/ all trial consistencies -- moistened, soft foods given. Pt took small sips w/ focus of task noted. OM Exam appeared [facility] w/ no unilateral weakness noted. Speech Clear. Pt fed self w/ setup support.   Recommend a fairly Regular consistency diet w/ well-Cut meats, moistened foods; Thin liquids -- Cup Drinking(as at home), and pt should Hold Cup when drinking. Recommend general aspiration precautions including support sitting fully upright for po's, reducing distractions at meals, small bites/sips slowly, tray setup. Pills 1 at a time w/ Water vs WHOLE in Puree for safer, easier swallowing.  Education given on Pills in Puree; food consistencies and easy to eat options; general aspiration precautions to pt and Niece present. No further skilled ST services for swallowing. NSG to reconsult if any new needs arise. NSG updated, agreed. MD updated. Recommend Dietician f/u for support. Precautions posted in chart.  SLP Visit Diagnosis: Dysphagia, unspecified (R13.10) (suspect impact from Acute illness/hospitalization- new stroke event; Baseline cognitive-linguistic deficits; need for setup and monitoring at meals)    Aspiration Risk   (reduced following  general aspiration precautions)    Diet Recommendation   Thin;Age appropriate regular (cut meats/foods; setup support) = a fairly Regular consistency diet w/ well-Cut meats, moistened foods; Thin liquids -- Cup Drinking(as at home), and pt should Hold Cup when drinking. Recommend general aspiration precautions including support sitting fully upright for po's, reducing distractions at meals, small bites/sips slowly, tray setup.   Medication Administration: Whole meds with liquid (vs Whole in Puree if need)    Other Recommendations Recommended Consults:  (n/a) Oral Care Recommendations: Oral care BID;Patient independent with oral care;Staff/trained caregiver to provide oral care (setup)     Swallow Evaluation Recommendations  See above   Assistance Recommended at Discharge  Intermittent as needed at meals  Functional Status Assessment Patient has not had a recent decline in their functional status (swallow)  Frequency and Duration  (n/a)   (n/a)       Prognosis Prognosis for improved oropharyngeal function: Good Barriers to Reach Goals:  (suspect impact from Acute illness/hospitalization event; Baseline cognitive-communication deficits; need for setup and monitoring at meals) Barriers/Prognosis Comment: suspect impact from Acute illness/hospitalization event; Baseline cognitive-communication deficits; need for setup and monitoring at meals      Swallow Study   General Date of Onset: 07/16/24 HPI: Per chart notes, pt is a 69 y.o. Caucasian male with medical history significant for Prior CVA w/ Aphasia and Apraxia(2020), coronary artery disease, combined systolic and diastolic  CHF, stage III CKD, type 2 diabetes mellitus, hypertension and dyslipidemia as well as paroxysmal atrial fibrillation on Eliquis , who presented to the ER with acute onset of altered mental status with confusion as well as expressive language deficits.  He was last seen yesterday morning in his normal state of health.  No  chest pain or palpitations.  No cough or wheezing or dyspnea.  No fever or chills.  No nausea or vomiting or diarrhea or abdominal pain.   MRI: Acute Right MCA territory infarct with mild associated edema and sulcal  effacement. No midline shift.  2. Encephalomalacia and gliosis in the Left Parietal lobe compatible with  remote infarct.  3. Small remote infarct in the right cerebellum. Type of Study: Bedside Swallow Evaluation Previous Swallow Assessment: none Diet Prior to this Study: NPO Temperature Spikes Noted: No (wbc 8.4) Respiratory Status: Room air History of Recent Intubation: No Behavior/Cognition: Alert;Cooperative;Pleasant mood;Distractible;Requires cueing (Impuslive; Language deficits, Apraxia) Oral Cavity Assessment: Within Functional Limits Oral Care Completed by SLP: Yes Oral Cavity - Dentition: Adequate natural dentition (missing couple) Vision: Functional for self-feeding Self-Feeding Abilities: Able to feed self;Needs assist;Needs set up Patient Positioning: Upright in bed (needed cues/guidance) Baseline Vocal Quality: Normal Volitional Cough: Strong    Oral/Motor/Sensory Function Overall Oral Motor/Sensory Function: Within functional limits (no gross OM weakness noted; no unilateral deficits)   Ice Chips Ice chips: Within functional limits Presentation: Spoon (fed; 3 trials) Other Comments: masticated chips immediately   Thin Liquid Thin Liquid: Within functional limits Presentation: Cup;Self Fed (10+ trials) Other Comments: took small sips; no straw    Nectar Thick Nectar Thick Liquid: Not tested   Honey Thick Honey Thick Liquid: Not tested   Puree Puree: Within functional limits Presentation: Self Fed;Spoon (supported; 5 trials) Other Comments: doesn't really like applesauce   Solid     Solid: Within functional limits (moistened) Presentation: Self Fed (supported 3 trials) Other Comments: doesn't really like graham crackers         Comer Portugal, MS,  CCC-SLP Speech Language Pathologist Rehab Services; [facility] [phone] (ascom) Brad Mcgaughy 07/17/2024,3:46 PM

## 2024-07-17 NOTE — Evaluation (Signed)
 Occupational Therapy Evaluation Patient Details Name: Gordon [name] MRN: [id number] DOB: February 16, 1956 Today's Date: 07/17/2024   History of Present Illness   Gordon [name] is a 69 y.o. Caucasian male with medical history significant for coronary artery disease, combined systolic and diastolic CHF, stage III CKD, type 2 diabetes mellitus, hypertension and dyslipidemia as well as paroxysmal atrial fibrillation on Eliquis  and CVA, who presented to the ER with acute onset of altered mental status with confusion as well as expressive dysphasia.     Clinical Impressions Pt was seen for OT evaluation this date. Prior to hospital admission, pt was Mod I/I in ADLs. Pt lives with niece. Pt currently requires Min a for donning shoes sitting on stretcher. Good static sitting balance. Pt MOD I supine to sit EOB, sit<>stand + RW. Pt ambulated ~70ft supervision + RW. Pt impulsive when ambulating, suspect related to frustration with word finding difficulty. Upon hospital discharge, no OT follow up recommended. Per niece in room, pt at baseline for mobility and ADLs, no skilled acute OT needs identified, will sign off.     If plan is discharge home, recommend the following:   A little help with bathing/dressing/bathroom;A little help with walking and/or transfers;Assist for transportation;Assistance with cooking/housework     Functional Status Assessment   Patient has had a recent decline in their functional status and demonstrates the ability to make significant improvements in function in a reasonable and predictable amount of time.     Equipment Recommendations   None recommended by OT     Recommendations for Other Services         Precautions/Restrictions   Precautions Precautions: Fall Recall of Precautions/Restrictions: Intact Restrictions Weight Bearing Restrictions Per Provider Order: No     Mobility Bed Mobility Overal bed mobility: Modified Independent                   Transfers Overall transfer level: Modified independent Equipment used: Rolling walker (2 wheels)                      Balance Overall balance assessment: Needs assistance Sitting-balance support: No upper extremity supported, Feet supported Sitting balance-Leahy Scale: Good     Standing balance support: No upper extremity supported, During functional activity Standing balance-Leahy Scale: Fair                             ADL either performed or assessed with clinical judgement   ADL Overall ADL's : Needs assistance/impaired                                       General ADL Comments: Min a for donning shoes sitting on stretcher. Good static sitting balance. Pt supervision supine to sit EOB, sit<>stand + RW. Pt ambulated ~46ft supervision + RW.     Vision         Perception         Praxis         Pertinent Vitals/Pain Pain Assessment Pain Assessment: No/denies pain     Extremity/Trunk Assessment Upper Extremity Assessment Upper Extremity Assessment: Overall WFL for tasks assessed   Lower Extremity Assessment Lower Extremity Assessment: Overall WFL for tasks assessed       Communication Communication Communication: Impaired Factors Affecting Communication: Other (comment);Difficulty expressing self;Reduced clarity of speech (expressive aphasia)   Cognition Arousal:  Alert Behavior During Therapy: [name] for tasks assessed/performed Cognition: Difficult to assess Difficult to assess due to: Impaired communication           OT - Cognition Comments: Word finding                 Following commands: Intact       Cueing  General Comments   Cueing Techniques: Verbal cues;Tactile cues      Exercises     Shoulder Instructions      Home Living Family/patient expects to be discharged to:: Private residence Living Arrangements: Other relatives Available Help at Discharge: Family Type of Home: Mobile  home Home Access: Stairs to enter Entrance Stairs-Number of Steps: 3-4 Entrance Stairs-Rails: None Home Layout: One level               Home Equipment: Agricultural Consultant (2 wheels)          Prior Functioning/Environment Prior Level of Function : Independent/Modified Independent               ADLs Comments: Independent in ADLs    OT Problem List: Decreased strength;Decreased activity tolerance;Decreased coordination;Decreased safety awareness   OT Treatment/Interventions:        OT Goals(Current goals can be found in the care plan section)   Acute Rehab OT Goals Patient Stated Goal: go home OT Goal Formulation: With patient Time For Goal Achievement: 07/31/24 Potential to Achieve Goals: Good   OT Frequency:       Co-evaluation PT/OT/SLP Co-Evaluation/Treatment: Yes Reason for Co-Treatment: Necessary to address cognition/behavior during functional activity   OT goals addressed during session: ADL's and self-care      AM-PAC OT 6 Clicks Daily Activity     Outcome Measure Help from another person eating meals?: None Help from another person taking care of personal grooming?: A Little Help from another person toileting, which includes using toliet, bedpan, or urinal?: A Little Help from another person bathing (including washing, rinsing, drying)?: A Little Help from another person to put on and taking off regular upper body clothing?: None Help from another person to put on and taking off regular lower body clothing?: A Little 6 Click Score: 20   End of Session Equipment Utilized During Treatment: Rolling walker (2 wheels)  Activity Tolerance: Patient tolerated treatment well Patient left: in bed;with bed alarm set;with family/visitor present;with call bell/phone within reach  OT Visit Diagnosis: Unsteadiness on feet (R26.81);Other abnormalities of gait and mobility (R26.89);Muscle weakness (generalized) (M62.81)                Time: 8850-8795 OT Time  Calculation (min): 15 min Charges:  OT General Charges $OT Visit: 1 Visit OT Evaluation $OT Eval Low Complexity: 1 Low  Yu Peggs OTS   Ronita Sauers 07/17/2024, 12:37 PM

## 2024-07-17 NOTE — Progress Notes (Signed)
 Same-day follow-up note  2D echocardiogram completed.  EF 40 to 45%, mitral valve normal, left atrium normal in size.   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 His last known well was 07/15/2024.  Start Eliquis  on 07/20/2024. Continue Plavix  Crestor  40 mg daily Follow-up with outpatient neurology in 8 to 12 weeks Follow-up with cardiology outpatient  Plan relayed to Dr. Jhonny

## 2024-07-17 NOTE — Evaluation (Signed)
 Physical Therapy Evaluation Patient Details Name: Gordon [name] MRN: [id number] DOB: 02/27/1956 Today's Date: 07/17/2024  History of Present Illness  Gordon [name] is a 69 y.o. Caucasian male with medical history significant for coronary artery disease, combined systolic and diastolic CHF, stage III CKD, type 2 diabetes mellitus, hypertension and dyslipidemia as well as paroxysmal atrial fibrillation on Eliquis  and CVA, who presented to the ER with acute onset of altered mental status with confusion as well as expressive dysphasia.  Clinical Impression  Patient admitted with the above. PTA, patient lives with niece and was modI with no Ad in the home and RW in the community. Patient able to complete bed mobility and sit to stand with RW modI. Ambulated within the room modI with RW. Impulsive due to frustrations. Niece present and states this is his baseline functioning. No further skilled PT needs identified acutely. No PT follow up recommended at this time. PT will complete orders.         If plan is discharge home, recommend the following: Assist for transportation;Help with stairs or ramp for entrance   Can travel by private vehicle        Equipment Recommendations None recommended by PT  Recommendations for Other Services       Functional Status Assessment Patient has had a recent decline in their functional status and demonstrates the ability to make significant improvements in function in a reasonable and predictable amount of time.     Precautions / Restrictions Precautions Precautions: Fall Recall of Precautions/Restrictions: Intact Restrictions Weight Bearing Restrictions Per Provider Order: No      Mobility  Bed Mobility Overal bed mobility: Modified Independent                  Transfers Overall transfer level: Modified independent Equipment used: Rolling [name] (2 wheels)                    Ambulation/Gait Ambulation/Gait assistance: Modified  independent (Device/Increase time) Gait Distance (Feet): 75 Feet Assistive device: Rolling [name] (2 wheels)     Gait velocity interpretation: >4.37 ft/sec, indicative of normal walking speed   General Gait Details: impulsive due to frustration but no overt LOB. Niece states this is baseline  Acupuncturist Bed    Modified Rankin (Stroke Patients Only)       Balance Overall balance assessment: Mild deficits observed, not formally tested                                           Pertinent Vitals/Pain Pain Assessment Pain Assessment: No/denies pain    Home Living Family/patient expects to be discharged to:: Private residence Living Arrangements: Other relatives Available Help at Discharge: Family Type of Home: Mobile home Home Access: Stairs to enter Entrance Stairs-Rails: None Entrance Stairs-Number of Steps: 3-4   Home Layout: One level Home Equipment: Agricultural Consultant (2 wheels)      Prior Function Prior Level of Function : Independent/Modified Independent               ADLs Comments: Independent in ADLs     Extremity/Trunk Assessment   Upper Extremity Assessment Upper Extremity Assessment: Overall WFL for tasks assessed    Lower Extremity Assessment Lower Extremity Assessment: Overall WFL for tasks assessed  Communication   Communication Communication: Impaired Factors Affecting Communication: Other (comment);Difficulty expressing self;Reduced clarity of speech (expressive aphasia)    Cognition Arousal: Alert Behavior During Therapy: WFL for tasks assessed/performed   PT - Cognitive impairments: Difficult to assess Difficult to assess due to: Impaired communication                       Following commands: Intact       Cueing Cueing Techniques: Verbal cues, Tactile cues     General Comments      Exercises     Assessment/Plan    PT Assessment Patient does not  need any further PT services  PT Problem List         PT Treatment Interventions      PT Goals (Current goals can be found in the Care Plan section)  Acute Rehab PT Goals Patient Stated Goal: to go home PT Goal Formulation: All assessment and education complete, DC therapy    Frequency       Co-evaluation   Reason for Co-Treatment: Necessary to address cognition/behavior during functional activity   OT goals addressed during session: ADL's and self-care       AM-PAC PT 6 Clicks Mobility  Outcome Measure Help needed turning from your back to your side while in a flat bed without using bedrails?: None Help needed moving from lying on your back to sitting on the side of a flat bed without using bedrails?: None Help needed moving to and from a bed to a chair (including a wheelchair)?: None Help needed standing up from a chair using your arms (e.g., wheelchair or bedside chair)?: None Help needed to walk in hospital room?: None Help needed climbing 3-5 steps with a railing? : None 6 Click Score: 24    End of Session   Activity Tolerance: Patient tolerated treatment well Patient left: in bed;with call bell/phone within reach;with bed alarm set;with family/visitor present Nurse Communication: Mobility status PT Visit Diagnosis: Muscle weakness (generalized) (M62.81)    Time: 8851-8795 PT Time Calculation (min) (ACUTE ONLY): 16 min   Charges:   PT Evaluation $PT Eval Low Complexity: 1 Low   PT General Charges $$ ACUTE PT VISIT: 1 Visit         Maryanne Finder, PT, DPT Physical Therapist - [facility]  [facility]   Alexys Lobello A Shavonn Convey 07/17/2024, 1:14 PM

## 2024-07-17 NOTE — Assessment & Plan Note (Signed)
-   He currently has controlled rate. - Will continue amiodarone , Toprol -XL and Eliquis .

## 2024-07-17 NOTE — Consult Note (Signed)
 NEUROLOGY CONSULT NOTE   Date of service: July 17, 2024 Patient Name: Gordon [name] MRN:  [id number] DOB:  18-Mar-1956 Chief Complaint: Confusion, speech difficulties Requesting Provider: Jhonny Calvin NOVAK, MD  History of Present Illness  Gordon [name] is a 69 y.o. male with hx of left MCA stroke with residual aphasia, atrial fibrillation on Eliquis , coronary artery disease on Plavix  in addition to being on Eliquis , combined systolic and diastolic heart failure, CKD 3, hypertension, hyperlipidemia, type 2 diabetes brought in for evaluation of speech difficulties and confusion with last known well sometime on the morning of due to the 2025.  When he was seen in the emergency department he was more than 24 hours from his last known well.  No preceding illness or sickness.  Patient is not a very good historian and was not able to give me a reliable history secondary to his aphasia.  No family is present at bedside at this time.  CT head was done in the ED that revealed a right MCA territory evolving infarct in addition to demonstrating the prior left MCA infarct and encephalomalacia. He was admitted for further workup and neurological consultation.  LKW: Sometime in the morning of 07/15/2024 Modified rankin score: 2-Slight disability-UNABLE to perform all activities but does not need assistance IV Thrombolysis: No-outside the window EVT: No-outside the window  NIHSS components Score: Comment  1a Level of Conscious 0[x]  1[]  2[]  3[]      1b LOC Questions 0[]  1[]  2[x]       1c LOC Commands 0[x]  1[]  2[]       2 Best Gaze 0[x]  1[]  2[]       3 Visual 0[x]  1[]  2[]  3[]      4 Facial Palsy 0[x]  1[]  2[]  3[]      5a Motor Arm - left 0[x]  1[]  2[]  3[]  4[]  UN[]    5b Motor Arm - Right 0[x]  1[]  2[]  3[]  4[]  UN[]    6a Motor Leg - Left 0[x]  1[]  2[]  3[]  4[]  UN[]    6b Motor Leg - Right 0[x]  1[]  2[]  3[]  4[]  UN[]    7 Limb Ataxia 0[x]  1[]  2[]  UN[]      8 Sensory 0[x]  1[]  2[]  UN[]      9 Best Language 0[]  1[]  2[x]  3[]       10 Dysarthria 0[]  1[x]  2[]  UN[]      11 Extinct. and Inattention 0[x]  1[]  2[]       TOTAL: 5      ROS  Unable to reliably perform comprehensive review of systems due to his aphasia.  Past History   Past Medical History:  Diagnosis Date   CAD (coronary artery disease)    a. 05/2019 Inf STEMI/Cath: LM nl, LAD 30ost/p/m, 42m, LCX mild diff dzs, RCA 100p/m (unsuccessful PCI), RPDA 90 (filled via L->R collats). EF: 30%.   Chronic combined systolic (congestive) and diastolic (congestive) heart failure (HCC)    a. 05/2019 Echo: EF 30-35%, Gr2 DD, mod red RV fxn, mild RAE, Mild MR, mild to mod TR, mild to mod Ao sclerosis, mildly elev PASP.   CKD (chronic kidney disease), stage III (HCC)    Current smoker    Diabetes mellitus (HCC)    A1C 6.1 (05/2019)   Hyperlipidemia LDL goal <70    Hypertension    Inferior MI (HCC)    05/2019   Ischemic cardiomyopathy    a. 05/2019 Echo: EF 30-35%.   Paroxysmal atrial fibrillation (HCC)    a. 05/2019 with CVA at admission. CHADS2VASc 5 (CHF, DM2, HTN, strokex2)-->Eliquis .   Stroke (cerebrum) (HCC)  a. 05/2019 MRI: mod-sized acute cortical/subcortical infarct w/in L MCA territory. Sm amt of petechial hemorrhage w/in posterior L insula.    Past Surgical History:  Procedure Laterality Date   CARDIAC CATHETERIZATION     CORONARY BALLOON ANGIOPLASTY N/A 05/19/2019   Procedure: CORONARY BALLOON ANGIOPLASTY;  Surgeon: Darron Deatrice LABOR, MD;  Location: [facility];  Service: Cardiovascular;  Laterality: N/A;   CORONARY/GRAFT ACUTE MI REVASCULARIZATION N/A 05/19/2019   Procedure: Coronary/Graft Acute MI Revascularization;  Surgeon: Darron Deatrice LABOR, MD;  Location: [facility];  Service: Cardiovascular;  Laterality: N/A;   LEFT HEART CATH AND CORONARY ANGIOGRAPHY N/A 05/19/2019   Procedure: LEFT HEART CATH AND CORONARY ANGIOGRAPHY;  Surgeon: Darron Deatrice LABOR, MD;  Location: [facility];  Service: Cardiovascular;  Laterality: N/A;     Family History: Family History  Problem Relation Age of Onset   Hyperlipidemia Mother    Hypertension Mother    Diabetes Mother    Stroke Mother    Hyperlipidemia Father    Hypertension Father    Hypertension Sister     Social History  reports that he quit smoking about 5 years ago. His smoking use included cigarettes. He started smoking about 35 years ago. He has a 15 pack-year smoking history. He has never used smokeless tobacco. He reports that he does not currently use alcohol. He reports that he does not use drugs.  Allergies[1]  Medications  Current Medications[2]  Vitals   Vitals:   07/17/24 0415 07/17/24 0430 07/17/24 0518 07/17/24 0600  BP: 133/78 135/79  (!) 137/93  Pulse: 62 60  (!) 25  Resp: 16 18  19   Temp:   98.3 F (36.8 C)   TempSrc:   Oral   SpO2: 93% 93%  100%  Weight:      Height:        Body mass index is 24.16 kg/m.   Physical Exam   General: Somewhat disheveled man, laying comfortably in bed in no acute distress HEENT-normocephalic atraumatic, moist oral mucous membranes CVS: Irregularly irregular Respiratory: Breathing well saturating normally on room air Neurological exam He is awake alert oriented to self He has significant expressive aphasia He is able to most simple single step follow commands.  He is not able to name objects reliably.  As repetition also seems impaired.  He also appears to have reduced attention concentration.  Also has mild dysarthria but Cranial nerves II to XII intact Motor examination with no drift Sensation intact Coordination-no dysmetria  Labs/Imaging/Neurodiagnostic studies   CBC:  Recent Labs  Lab 07/23/24 1953 07/17/24 0500  WBC 8.9 8.4  NEUTROABS 6.5  --   HGB 16.8 15.3  HCT 49.6 45.4  MCV 94.3 95.0  PLT 129* 120*   Basic Metabolic Panel:  Lab Results  Component Value Date   NA 140 07/17/2024   K 3.8 07/17/2024   CO2 17 (L) 07/17/2024   GLUCOSE 86 07/17/2024   BUN 24 (H) 07/17/2024    CREATININE 1.57 (H) 07/17/2024   CALCIUM  8.1 (L) 07/17/2024   GFRNONAA 47 (L) 07/17/2024   GFRAA 22 (L) 02/06/2020   Lipid Panel:  Lab Results  Component Value Date   LDLCALC 112 (H) 07/17/2024   HgbA1c:  Lab Results  Component Value Date   HGBA1C 6.1 (H) 05/21/2019    INR  Lab Results  Component Value Date   INR 1.0 July 23, 2024   APTT  Lab Results  Component Value Date   APTT 32 2024/07/23  CT Head without contrast(Personally reviewed): Evolving acute infarction in the right parieto-occipital area with extension into the posterior insula involving portions of the posterior right MCA and posterior cerebral artery territory.  Encephalomalacia in close is of the high left frontoparietal area-likely from the remote MCA infarct.  Atrophy, small vessel changes and vascular calcifications.  CT angio Head and Neck with contrast(Personally reviewed): Advanced atherosclerosis and near occlusion of the right MCA M2 branch, otherwise no LVO.  Severe atherosclerotic stenosis-nondominant left vertebral artery origin, left V4, left A2 A3 junction.  Moderate stenosis bilateral ICA siphons, dominant right vertebral artery origin.  Bilateral carotid bifurcation atherosclerosis and proximal ICA stenosis 63% on the left and 57% on the right.  Aortic atherosclerosis.  ASSESSMENT   Gordon [name] is a 69 y.o. male who is currently on Eliquis  and Plavix  for atrial fibrillation and coronary artery disease, stage III CKD, diabetes, hypertension, hyperlipidemia, prior left MCA stroke with residual aphasia, who was brought in for worsening aphasia and confusion and noted to have an evolving infarction in the right parietal occipital area with extension into the posterior insula-right MCA territory but also possibly right PCA territory.  CT angiography head and neck shows advanced atherosclerosis intracranially and near occlusive stenosis of the right MCA M2 branch, otherwise no ELVO but moderate to severe  atherosclerotic stenosis and multiple other vessels in the head and neck as documented above. Given his history of atrial fibrillation and involvement of more than 1 territory with a stroke on the CT, likely etiology is cardioembolic but needs further workup with further imaging and other testing as below.  Impression: Acute ischemic infarction-likely cardioembolic   RECOMMENDATIONS  Frequent neurochecks Telemetry I would hold his Eliquis  for today-let us  look at the MRI and the extent of the stroke size and then decide on resumption timing.  Okay to take Plavix . High intensity statin for goal LDL less than 70.  He is currently on Crestor  20 and his LDL is above goal.  I would increase Crestor  to 40 mg daily MRI brain without contrast 2D echocardiogram A1c 6.1.  Goal less than 7. PT OT Speech therapy He is now over 48 hours from his last known well, goal blood pressure should be normotension with avoidance of hypotension. With his moderate to severe atherosclerosis in the intracranial and cervical blood vessels, I would recommend keeping his blood pressures a little on the higher side with goal around 140/90, but have to be cautious about to have a pressure given he is also on an antiplatelet and anticoagulant. Will follow Plan relayed to Dr. Jhonny ______________________________________________________________________    Signed, Eligio Lav, MD [facility]     [1] No Known Allergies [2]  Current Facility-Administered Medications:    [START ON 07/18/2024]  stroke: early stages of recovery book, , Does not apply, Once, Mansy, Jan A, MD   0.9 %  sodium chloride  infusion, , Intravenous, Continuous, Mansy, Jan A, MD, Last Rate: 75 mL/hr at 07/17/24 0116, New Bag at 07/17/24 0116   acetaminophen  (TYLENOL ) tablet 650 mg, 650 mg, Oral, Q6H PRN **OR** acetaminophen  (TYLENOL ) suppository 650 mg, 650 mg, Rectal, Q6H PRN, Mansy, Jan A, MD   amiodarone  (PACERONE ) tablet 200 mg,  200 mg, Oral, Daily, Mansy, Jan A, MD   apixaban  (ELIQUIS ) tablet 5 mg, 5 mg, Oral, BID, Mansy, Jan A, MD   clopidogrel  (PLAVIX ) tablet 75 mg, 75 mg, Oral, Daily, Mansy, Jan A, MD   furosemide  (LASIX ) tablet 40 mg, 40 mg, Oral, Daily, Mansy,  Madison LABOR, MD   hydrALAZINE  (APRESOLINE ) tablet 50 mg, 50 mg, Oral, TID, Mansy, Jan A, MD   isosorbide  mononitrate (IMDUR ) 24 hr tablet 60 mg, 60 mg, Oral, Daily, Mansy, Jan A, MD   labetalol  (NORMODYNE ) injection 20 mg, 20 mg, Intravenous, Q3H PRN, Mansy, Jan A, MD, 20 mg at 07/16/24 2315   magnesium  hydroxide (MILK OF MAGNESIA) suspension 30 mL, 30 mL, Oral, Daily PRN, Mansy, Jan A, MD   metoprolol  succinate (TOPROL -XL) 24 hr tablet 50 mg, 50 mg, Oral, Daily, Mansy, Jan A, MD   ondansetron  (ZOFRAN ) tablet 4 mg, 4 mg, Oral, Q6H PRN **OR** ondansetron  (ZOFRAN ) injection 4 mg, 4 mg, Intravenous, Q6H PRN, Mansy, Jan A, MD   potassium chloride  SA (KLOR-CON  M) CR tablet 20 mEq, 20 mEq, Oral, Daily, Mansy, Jan A, MD   rosuvastatin  (CRESTOR ) tablet 20 mg, 20 mg, Oral, Daily, Mansy, Jan A, MD   traZODone  (DESYREL ) tablet 25 mg, 25 mg, Oral, QHS PRN, Mansy, Madison LABOR, MD  Current Outpatient Medications:    apixaban  (ELIQUIS ) 5 MG TABS tablet, Take 1 tablet (5 mg total) by mouth 2 (two) times daily., Disp: 180 tablet, Rfl: 1   hydrALAZINE  (APRESOLINE ) 50 MG tablet, Take 1 tablet (50 mg total) by mouth 3 (three) times daily., Disp: 90 tablet, Rfl: 6   potassium chloride  SA (KLOR-CON  M) 20 MEQ tablet, Take 1 tablet (20 mEq total) by mouth daily., Disp: 30 tablet, Rfl: 5   amiodarone  (PACERONE ) 200 MG tablet, Take 1 tablet (200 mg total) by mouth daily., Disp: 30 tablet, Rfl: 0   clopidogrel  (PLAVIX ) 75 MG tablet, Take 1 tablet (75 mg total) by mouth daily. (Patient not taking: Reported on 07/17/2024), Disp: 30 tablet, Rfl: 0   furosemide  (LASIX ) 40 MG tablet, Take 1 tablet (40 mg total) by mouth daily., Disp: 30 tablet, Rfl: 5   isosorbide  mononitrate (IMDUR ) 60 MG 24 hr tablet,  Take 1 tablet (60 mg total) by mouth daily., Disp: , Rfl:    metoprolol  succinate (TOPROL -XL) 50 MG 24 hr tablet, Take 1 tablet (50 mg total) by mouth daily. Take with or immediately following a meal., Disp: 30 tablet, Rfl: 0   rosuvastatin  (CRESTOR ) 20 MG tablet, Take 1 tablet (20 mg total) by mouth daily. (Patient not taking: Reported on 07/17/2024), Disp: 30 tablet, Rfl: 0

## 2024-07-17 NOTE — H&P (Signed)
 "     [place]   PATIENT NAME: Gordon [name]    MR#:  [id number]  DATE OF BIRTH:  22-Apr-1956  DATE OF ADMISSION:  07/16/2024  PRIMARY CARE PHYSICIAN: Donnie Handing, PA   Patient is coming from: Home  REQUESTING/REFERRING PHYSICIAN: Floy Roberts, MD  CHIEF COMPLAINT:   Chief Complaint  Patient presents with   Aphasia    HISTORY OF PRESENT ILLNESS:  Gordon [name] is a 69 y.o. Caucasian male with medical history significant for coronary artery disease, combined systolic and diastolic CHF, stage III CKD, type 2 diabetes mellitus, hypertension and dyslipidemia as well as paroxysmal atrial fibrillation on Eliquis  and CVA, who presented to the ER with acute onset of altered mental status with confusion as well as expressive dysphasia.  He was last seen yesterday morning in his normal state of health.  No chest pain or palpitations.  No cough or wheezing or dyspnea.  No fever or chills.  No nausea or vomiting or diarrhea or abdominal pain.  No dysuria, oliguria or hematuria or flank pain.  ED Course: When he came to the ER, BP was 198/108 with respiratory rate of 26 and heart rate 53 and otherwise normal vital signs.  Labs revealed a BUN of 27 with creatinine 1.71 better than previous levels and CO2 of 18 with otherwise unremarkable CMP.  CBC was remarkable for platelets of 129 close to previous levels and coag profile was normal. EKG as reviewed by me : EKG showed atrial fibrillation with controlled ventricular sponsor 76 with Q waves inferiorly with T wave inversion inferiorly. Imaging: Noncontrasted head CT scan revealed the following: 1. Acute nonhemorrhagic infarct in the right parietooccipital area with partial extension into the posterior insula, involving portions of the posterior right MCA and posterior cerebral artery territory. 2. Encephalomalacia and gliosis in the high left frontoparietal area from a remote left MCA infarct. 3. Atrophy, small vessel changes, and vascular  calcifications. 4. Discussed over the phone with Doctor Floy at 8:56 p.m., 07/16/2024, with verbal acknowledgement of findings.  The patient will be admitted to a medical telemetry observation bed for further evaluation and management. PAST MEDICAL HISTORY:   Past Medical History:  Diagnosis Date   CAD (coronary artery disease)    a. 05/2019 Inf STEMI/Cath: LM nl, LAD 30ost/p/m, 57m, LCX mild diff dzs, RCA 100p/m (unsuccessful PCI), RPDA 90 (filled via L->R collats). EF: 30%.   Chronic combined systolic (congestive) and diastolic (congestive) heart failure (HCC)    a. 05/2019 Echo: EF 30-35%, Gr2 DD, mod red RV fxn, mild RAE, Mild MR, mild to mod TR, mild to mod Ao sclerosis, mildly elev PASP.   CKD (chronic kidney disease), stage III (HCC)    Current smoker    Diabetes mellitus (HCC)    A1C 6.1 (05/2019)   Hyperlipidemia LDL goal <70    Hypertension    Inferior MI (HCC)    05/2019   Ischemic cardiomyopathy    a. 05/2019 Echo: EF 30-35%.   Paroxysmal atrial fibrillation (HCC)    a. 05/2019 with CVA at admission. CHADS2VASc 5 (CHF, DM2, HTN, strokex2)-->Eliquis .   Stroke (cerebrum) (HCC)    a. 05/2019 MRI: mod-sized acute cortical/subcortical infarct w/in L MCA territory. Sm amt of petechial hemorrhage w/in posterior L insula.    PAST SURGICAL HISTORY:   Past Surgical History:  Procedure Laterality Date   CARDIAC CATHETERIZATION     CORONARY BALLOON ANGIOPLASTY N/A 05/19/2019   Procedure: CORONARY BALLOON ANGIOPLASTY;  Surgeon: Darron Deatrice LABOR,  MD;  Location: [facility];  Service: Cardiovascular;  Laterality: N/A;   CORONARY/GRAFT ACUTE MI REVASCULARIZATION N/A 05/19/2019   Procedure: Coronary/Graft Acute MI Revascularization;  Surgeon: Darron Deatrice LABOR, MD;  Location: [facility];  Service: Cardiovascular;  Laterality: N/A;   LEFT HEART CATH AND CORONARY ANGIOGRAPHY N/A 05/19/2019   Procedure: LEFT HEART CATH AND CORONARY ANGIOGRAPHY;  Surgeon: Darron Deatrice LABOR, MD;  Location: [facility];  Service: Cardiovascular;  Laterality: N/A;    SOCIAL HISTORY:   Social History   Tobacco Use   Smoking status: Former    Current packs/day: 0.00    Average packs/day: 0.5 packs/day for 30.0 years (15.0 ttl pk-yrs)    Types: Cigarettes    Start date: 05/18/1989    Quit date: 05/19/2019    Years since quitting: 5.1   Smokeless tobacco: Never  Substance Use Topics   Alcohol use: Not Currently    FAMILY HISTORY:   Family History  Problem Relation Age of Onset   Hyperlipidemia Mother    Hypertension Mother    Diabetes Mother    Stroke Mother    Hyperlipidemia Father    Hypertension Father    Hypertension Sister     DRUG ALLERGIES:  Allergies[1]  REVIEW OF SYSTEMS:   ROS As per history of present illness. All pertinent systems were reviewed above. Constitutional, HEENT, cardiovascular, respiratory, GI, GU, musculoskeletal, neuro, psychiatric, endocrine, integumentary and hematologic systems were reviewed and are otherwise negative/unremarkable except for positive findings mentioned above in the HPI.   MEDICATIONS AT HOME:   Prior to Admission medications  Medication Sig Start Date End Date Taking? Authorizing Provider  apixaban  (ELIQUIS ) 5 MG TABS tablet Take 1 tablet (5 mg total) by mouth 2 (two) times daily. 12/29/23  Yes Bradler, Evan K, MD  hydrALAZINE  (APRESOLINE ) 50 MG tablet Take 1 tablet (50 mg total) by mouth 3 (three) times daily. 12/29/23 07/17/24 Yes Bradler, Evan K, MD  potassium chloride  SA (KLOR-CON  M) 20 MEQ tablet Take 1 tablet (20 mEq total) by mouth daily. 12/29/23  Yes Bradler, Evan K, MD  amiodarone  (PACERONE ) 200 MG tablet Take 1 tablet (200 mg total) by mouth daily. 12/29/23 01/28/24  Bradler, Evan K, MD  clopidogrel  (PLAVIX ) 75 MG tablet Take 1 tablet (75 mg total) by mouth daily. Patient not taking: Reported on 07/17/2024 12/29/23   Bradler, Evan K, MD  furosemide  (LASIX ) 40 MG tablet Take 1 tablet (40 mg  total) by mouth daily. 12/29/23 03/28/24  Bradler, Evan K, MD  isosorbide  mononitrate (IMDUR ) 60 MG 24 hr tablet Take 1 tablet (60 mg total) by mouth daily. 12/29/23 01/28/24  Bradler, Evan K, MD  metoprolol  succinate (TOPROL -XL) 50 MG 24 hr tablet Take 1 tablet (50 mg total) by mouth daily. Take with or immediately following a meal. 12/29/23 01/28/24  Bradler, Evan K, MD  rosuvastatin  (CRESTOR ) 20 MG tablet Take 1 tablet (20 mg total) by mouth daily. Patient not taking: Reported on 07/17/2024 12/29/23 07/17/24  Jossie Artist POUR, MD      VITAL SIGNS:  Blood pressure (!) 142/88, pulse 71, temperature 98.4 F (36.9 C), temperature source Oral, resp. rate 15, height 5' 6 (1.676 m), weight 67.9 kg, SpO2 96%.  PHYSICAL EXAMINATION:  Physical Exam  GENERAL:  69 y.o.-year-old patient lying in the bed with no acute distress.  He was mildly confused but would respond to some questions appropriately. EYES: Pupils equal, round, reactive to light and accommodation. No scleral icterus. Extraocular  muscles intact.  HEENT: Head atraumatic, normocephalic. Oropharynx and nasopharynx clear.  NECK:  Supple, no jugular venous distention. No thyroid enlargement, no tenderness.  LUNGS: Normal breath sounds bilaterally, no wheezing, rales,rhonchi or crepitation. No use of accessory muscles of respiration.  CARDIOVASCULAR: Regular rate and rhythm, S1, S2 normal. No murmurs, rubs, or gallops.  ABDOMEN: Soft, nondistended, nontender. Bowel sounds present. No organomegaly or mass.  EXTREMITIES: No pedal edema, cyanosis, or clubbing.  NEUROLOGIC: Cranial nerves II through XII are intact except for mild expressive dysphasia. Muscle strength 5/5 in all extremities. Sensation intact. Gait not checked.  PSYCHIATRIC: The patient is fairly confused.  Normal affect and good eye contact. SKIN: No obvious rash, lesion, or ulcer.   LABORATORY PANEL:   CBC Recent Labs  Lab 07/16/24 1953  WBC 8.9  HGB 16.8  HCT 49.6  PLT 129*    ------------------------------------------------------------------------------------------------------------------  Chemistries  Recent Labs  Lab 07/16/24 1953  NA 141  K 4.1  CL 111  CO2 18*  GLUCOSE 101*  BUN 27*  CREATININE 1.71*  CALCIUM  9.0  AST 20  ALT 9  ALKPHOS 87  BILITOT 1.1   ------------------------------------------------------------------------------------------------------------------  Cardiac Enzymes No results for input(s): TROPONINI in the last 168 hours. ------------------------------------------------------------------------------------------------------------------  RADIOLOGY:  CT HEAD WO CONTRAST Result Date: 07/16/2024 EXAM: CT HEAD WITHOUT CONTRAST 07/16/2024 08:41:11 PM TECHNIQUE: CT of the head was performed without the administration of intravenous contrast. Automated exposure control, iterative reconstruction, and/or weight based adjustment of the mA/kV was utilized to reduce the radiation dose to as low as reasonably achievable. COMPARISON: Head CT from 05/23/2019. CLINICAL HISTORY: Neuro deficit, acute, stroke suspected. Acute neurologic deficit; stroke suspected. FINDINGS: BRAIN AND VENTRICLES: There is new parenchymal hypodensity, sulcal effacement, swelling and loss of gray-white matter differentiation consistent with an acute nonhemorrhagic infarct in the right parietooccipital area with partial extension into the posterior insula. The infarct measures 5.1 cm AP, 4.8 cm transverse, and 3.6 cm craniocaudal. Portions of the posterior right MCA territory and posterior cerebral artery territory are involved. There is encephalomalacia and gliosis in the high left frontoparietal area from a remote left MCA infarct. Mild atrophy and small vessel disease. Mild atrophic ventriculomegaly without midline shift or downward mass effect. No acute hemorrhage. No other acute infarct is seen. There are chronic small vessel changes in the pons. Brainstem and cerebellum  show no acute abnormality. There is calcification in the distal vertebral arteries and siphons without an appreciable hyperdense vessel. No extra-axial collection. No mass effect or midline shift. ORBITS: No acute abnormality. SINUSES: Chronic membrane thickening and hyperostosis in the left sphenoid air cell. Other visualized sinuses and mastoids are clear. SOFT TISSUES AND SKULL: No acute soft tissue abnormality. No skull fracture. IMPRESSION: 1. Acute nonhemorrhagic infarct in the right parietooccipital area with partial extension into the posterior insula, involving portions of the posterior right MCA and posterior cerebral artery territory. 2. Encephalomalacia and gliosis in the high left frontoparietal area from a remote left MCA infarct. 3. Atrophy, small vessel changes, and vascular calcifications. 4. Discussed over the phone with Doctor Floy at 8:56 p.m., 07/16/2024, with verbal acknowledgement of findings. Electronically signed by: Francis Quam MD 07/16/2024 09:07 PM EST RP Workstation: [id number]      IMPRESSION AND PLAN:  Assessment and Plan: * Acute CVA (cerebrovascular accident) (HCC) - This is manifested by expressive dysphasia and confusion. - The patient will be admitted to an observation medically monitored bed.   - We will follow neuro checks q.4 hours for  24 hours.   - The patient will be continued on Eliquis  and Plavix . - Will obtain a brain MRI without contrast as well as head and neck CTA and 2D echo with bubble study .   - A neurology consultation  as well as physical/occupation/speech therapy consults will be obtained in a.m.SABRA - I notified Dr. Arora about the patient. - The patient will be placed on statin therapy and fasting lipids will be checked.   Paroxysmal atrial fibrillation (HCC) - He currently has controlled rate. - Will continue amiodarone , Toprol -XL and Eliquis .  Dyslipidemia - Will continue statin therapy and check fasting lipids.  Essential  hypertension - Continue antihypertensive therapy with permissive parameters    DVT prophylaxis: Eliquis  Advanced Care Planning:  Code Status: full code. Family Communication:  The plan of care was discussed in details with the patient (and family). I answered all questions. The patient agreed to proceed with the above mentioned plan. Further management will depend upon hospital course. Disposition Plan: Back to previous home environment Consults called: Neurology All the records are reviewed and case discussed with ED provider.  Status is: Observation   I certify that at the time of admission, it is my clinical judgment that the patient will require hospital care extending less than 2 midnights.                            Dispo: The patient is from: Home              Anticipated d/c is to: Home              Patient currently is not medically stable to d/c.              Difficult to place patient: No  Madison DELENA Peaches M.D on 07/17/2024 at 4:21 AM  [facility]   From 7 PM-7 AM, contact night-coverage [email or web address]  CC: Primary care physician; Donnie Handing, PA     [1] No Known Allergies  "

## 2024-07-17 NOTE — Assessment & Plan Note (Signed)
-   Continue antihypertensive therapy with permissive parameters

## 2024-07-17 NOTE — Assessment & Plan Note (Signed)
Will continue statin therapy and check fasting lipids.

## 2024-07-18 ENCOUNTER — Other Ambulatory Visit: Payer: Self-pay

## 2024-07-18 MED ORDER — APIXABAN 5 MG PO TABS
5.0000 mg | ORAL_TABLET | Freq: Two times a day (BID) | ORAL | 0 refills | Status: AC
  Filled 2024-07-18: qty 60, 30d supply, fill #0

## 2024-07-18 MED ORDER — AMIODARONE HCL 200 MG PO TABS
200.0000 mg | ORAL_TABLET | Freq: Every day | ORAL | 0 refills | Status: AC
  Filled 2024-07-18: qty 30, 30d supply, fill #0

## 2024-07-18 MED ORDER — CLOPIDOGREL BISULFATE 75 MG PO TABS
75.0000 mg | ORAL_TABLET | Freq: Every day | ORAL | 0 refills | Status: AC
  Filled 2024-07-18: qty 30, 30d supply, fill #0

## 2024-07-18 MED ORDER — POTASSIUM CHLORIDE CRYS ER 20 MEQ PO TBCR
20.0000 meq | EXTENDED_RELEASE_TABLET | Freq: Every day | ORAL | 0 refills | Status: AC
  Filled 2024-07-18: qty 30, 30d supply, fill #0

## 2024-07-18 MED ORDER — HYDRALAZINE HCL 50 MG PO TABS
50.0000 mg | ORAL_TABLET | Freq: Three times a day (TID) | ORAL | 0 refills | Status: AC
  Filled 2024-07-18: qty 90, 30d supply, fill #0

## 2024-07-18 MED ORDER — ROSUVASTATIN CALCIUM 40 MG PO TABS
40.0000 mg | ORAL_TABLET | Freq: Every day | ORAL | 0 refills | Status: AC
  Filled 2024-07-18: qty 30, 30d supply, fill #0

## 2024-07-18 MED ORDER — ISOSORBIDE MONONITRATE ER 60 MG PO TB24
60.0000 mg | ORAL_TABLET | Freq: Every day | ORAL | 0 refills | Status: AC
  Filled 2024-07-18: qty 30, 30d supply, fill #0

## 2024-07-18 MED ORDER — METOPROLOL SUCCINATE ER 50 MG PO TB24
50.0000 mg | ORAL_TABLET | Freq: Every day | ORAL | 0 refills | Status: AC
  Filled 2024-07-18: qty 30, 30d supply, fill #0

## 2024-07-18 MED ORDER — FUROSEMIDE 40 MG PO TABS
40.0000 mg | ORAL_TABLET | Freq: Every day | ORAL | 0 refills | Status: AC
  Filled 2024-07-18: qty 30, 30d supply, fill #0

## 2024-07-18 NOTE — Plan of Care (Signed)
" °  Problem: Ischemic Stroke/TIA Tissue Perfusion: Goal: Complications of ischemic stroke/TIA will be minimized 07/18/2024 0004 by Gladis Manuelita PARAS, RN Outcome: Progressing 07/17/2024 2231 by Gladis Manuelita PARAS, RN Outcome: Progressing   Problem: Self-Care: Goal: Ability to communicate needs accurately will improve 07/18/2024 0004 by Gladis Manuelita PARAS, RN Outcome: Progressing 07/17/2024 2231 by Gladis Manuelita PARAS, RN Outcome: Progressing   "

## 2024-07-18 NOTE — Discharge Summary (Signed)
 Physician Discharge Summary  Gordon [name] FMW:[id number] DOB: 1956/01/03 DOA: 07/16/2024  PCP: Donnie Handing, PA  Admit date: 07/16/2024 Discharge date: 07/18/2024  Admitted From: Home Disposition:  Home  Recommendations for Outpatient Follow-up:  Follow up with PCP in 1-2 weeks Ambulatory referral to neurology Ambulatory referral cardiology  Home Health: No Equipment/Devices: None  Discharge Condition: Stable CODE STATUS: Full Diet recommendation: Regular  Brief/Interim Summary:  69 y.o. Caucasian male with medical history significant for coronary artery disease, combined systolic and diastolic CHF, stage III CKD, type 2 diabetes mellitus, hypertension and dyslipidemia as well as paroxysmal atrial fibrillation on Eliquis  and CVA, who presented to the ER with acute onset of altered mental status with confusion as well as expressive dysphasia.  He was last seen yesterday morning in his normal state of health.  No chest pain or palpitations.  No cough or wheezing or dyspnea.  No fever or chills.  No nausea or vomiting or diarrhea or abdominal pain.  No dysuria, oliguria or hematuria or flank pain.    Discharge Diagnoses:  Principal Problem:   Acute CVA (cerebrovascular accident) [facility]) Active Problems:   Essential hypertension   Dyslipidemia   Paroxysmal atrial fibrillation (HCC)   Acute CVA Cerebrovascular disease Manifested by expressive aphasia and confusion.  Last known well 2/3.  CT angiography with advanced atherosclerotic disease.  Near occlusive stenosis of right MCA M2 branch.  Likely etiology of stroke is cardioembolic.  Echocardiogram completed and results are reassuring.  Ejection fraction 35 to 40% actually improved from prior Plan: Continue Plavix  Hold Eliquis  until 2/8 Crestor  40 mg daily Telemetry monitoring Outpatient neurology follow-up 8-12 Outpatient cardiology follow-up 6 to 8 weeks   Paroxysmal atrial fibrillation (HCC) Currently rate controlled Continue  amiodarone  and metoprolol  Hold Eliquis  as above until 2/8, then resume   Dyslipidemia Crestor  40 mg daily   Essential hypertension Normotensive BP goals Continue current regimen    Discharge Instructions  Discharge Instructions     Ambulatory referral to Cardiology   Complete by: As directed    Ambulatory referral to Neurology   Complete by: As directed    Increase activity slowly   Complete by: As directed       Allergies as of 07/18/2024   No Known Allergies      Medication List     TAKE these medications    amiodarone  200 MG tablet Commonly known as: Pacerone  Take 1 tablet (200 mg total) by mouth daily.   clopidogrel  75 MG tablet Commonly known as: PLAVIX  Take 1 tablet (75 mg total) by mouth daily.   Eliquis  5 MG Tabs tablet Generic drug: apixaban  Take 1 tablet (5 mg total) by mouth 2 (two) times daily. Start taking on: July 20, 2024 What changed: These instructions start on July 20, 2024. If you are unsure what to do until then, ask your doctor or other care provider.   furosemide  40 MG tablet Commonly known as: LASIX  Take 1 tablet (40 mg total) by mouth daily.   hydrALAZINE  50 MG tablet Commonly known as: APRESOLINE  Take 1 tablet (50 mg total) by mouth 3 (three) times daily.   isosorbide  mononitrate 60 MG 24 hr tablet Commonly known as: IMDUR  Take 1 tablet (60 mg total) by mouth daily.   metoprolol  succinate 50 MG 24 hr tablet Commonly known as: TOPROL -XL Take 1 tablet (50 mg total) by mouth daily. Take with or immediately following a meal.   potassium chloride  SA 20 MEQ tablet Commonly known as: KLOR-CON  M Take  1 tablet (20 mEq total) by mouth daily.   rosuvastatin  40 MG tablet Commonly known as: CRESTOR  Take 1 tablet (40 mg total) by mouth daily. Start taking on: July 19, 2024 What changed:  medication strength how much to take        Follow-up Information     Donnie Handing, [place]. Go on 07/29/2024.   Specialty: Family  Medicine Why: @ 9:40am Contact information: [address] [place] [address] [phone]         Alla Amis, MD Follow up.   Specialty: Family Medicine Why: [facility].  Please contact office if you would like to establish care there.  There are multiple physicians, please schedule next available appointment. Contact information: [address] [facility] [place] [address] [phone]                Allergies[1]  Consultations: Neurology   Procedures/Studies: ECHOCARDIOGRAM COMPLETE BUBBLE STUDY Result Date: 07/17/2024    ECHOCARDIOGRAM REPORT   Patient Name:   Gordon [name] Date of Exam: 07/17/2024 Medical Rec #:  [id number]    Height:       66.0 in Accession #:    [id number]   Weight:       149.7 lb Date of Birth:  March 07, 1956     BSA:          1.768 m Patient Age:    68 years     BP:           143/94 mmHg Patient Gender: M            HR:           56 bpm. Exam Location:  [facility] Procedure: 2D Echo, Cardiac Doppler and Color Doppler (Both Spectral and Color            Flow Doppler were utilized during procedure). Indications:     Stroke  History:         Patient has prior history of Echocardiogram examinations. CHF;                  STEMI.  Sonographer:     Rainelle Gull Referring Phys:  [id number] JAN A MANSY Diagnosing Phys: Dwayne D Callwood MD  Sonographer Comments: Image acquisition challenging due to patient behavioral factors. IMPRESSIONS  1. Left ventricular ejection fraction, by estimation, is 40 to 45%. The left ventricle has mildly decreased function. The left ventricle has no regional wall motion abnormalities. There is mild left ventricular hypertrophy. Left ventricular diastolic function could not be evaluated.  2. Right ventricular systolic function is normal. The right ventricular size is normal.  3. The mitral valve is normal in structure. No evidence of mitral valve regurgitation.  4. The aortic valve is grossly normal.  Aortic valve regurgitation is not visualized. FINDINGS  Left Ventricle: Left ventricular ejection fraction, by estimation, is 40 to 45%. The left ventricle has mildly decreased function. The left ventricle has no regional wall motion abnormalities. Strain was performed and the global longitudinal strain is indeterminate. The left ventricular internal cavity size was normal in size. There is mild left ventricular hypertrophy. Left ventricular diastolic function could not be evaluated. Right Ventricle: The right ventricular size is normal. No increase in right ventricular wall thickness. Right ventricular systolic function is normal. Left Atrium: Left atrial size was normal in size. Right Atrium: Right atrial size was normal in size. Pericardium: There is no evidence of pericardial effusion. Mitral Valve: The mitral valve  is normal in structure. No evidence of mitral valve regurgitation. Tricuspid Valve: The tricuspid valve is normal in structure. Tricuspid valve regurgitation is not demonstrated. Aortic Valve: The aortic valve is grossly normal. Aortic valve regurgitation is not visualized. Pulmonic Valve: The pulmonic valve was not well visualized. Pulmonic valve regurgitation is not visualized. Aorta: The aortic root was not well visualized. IAS/Shunts: No atrial level shunt detected by color flow Doppler. Additional Comments: 3D was performed not requiring image post processing on an independent workstation and was indeterminate.  LEFT VENTRICLE PLAX 2D LVIDd:         4.20 cm LVIDs:         3.40 cm LV PW:         1.20 cm LV IVS:        1.20 cm LVOT diam:     1.90 cm LVOT Area:     2.84 cm  LV Volumes (MOD) LV vol d, MOD A4C: 87.7 ml LV vol s, MOD A4C: 49.9 ml LV SV MOD A4C:     87.7 ml RIGHT VENTRICLE RV Basal diam:  3.00 cm RV Mid diam:    2.20 cm LEFT ATRIUM           Index        RIGHT ATRIUM           Index LA Vol (A4C): 72.0 ml 40.72 ml/m  RA Area:     17.00 cm                                    RA Volume:    45.10 ml  25.51 ml/m   AORTA Ao Root diam: 2.90 cm Ao Asc diam:  3.70 cm  SHUNTS Systemic Diam: 1.90 cm Cara JONETTA Lovelace MD Electronically signed by Cara JONETTA Lovelace MD Signature Date/Time: 07/17/2024/12:55:15 PM    Final    MR BRAIN WO CONTRAST Result Date: 07/17/2024 EXAM: MRI BRAIN WITHOUT CONTRAST 07/17/2024 11:38:59 AM TECHNIQUE: Multiplanar multisequence MRI of the head/brain was performed without the administration of intravenous contrast. COMPARISON: CT head 07/16/2024 and CTA head and neck 07/17/2024. CLINICAL HISTORY: Stroke, follow up. FINDINGS: BRAIN AND VENTRICLES: There is restricted diffusion within the right MCA territory primarily involving the posterior and superior aspects of the right temporal lobe extending into the inferior right parietal lobe. There is mild associated edema and sulcal effacement with mild mass effect on the posterior right temporal horn. No midline shift. Encephalomalacia and gliosis are present in the left parietal lobe, compatible with remote infarct. T2 and FLAIR hyperintensity is noted in the periventricular and subcortical white matter, compatible with chronic microvascular ischemic changes. There is mild age-related parenchymal volume loss. A small remote infarct is seen in the right cerebellum. No intracranial hemorrhage. No mass. No hydrocephalus. The sella is unremarkable. Normal flow voids. ORBITS: No significant abnormality. SINUSES AND MASTOIDS: Mucosal thickening is present in the left sphenoid sinus. The mastoids show no significant abnormality. BONES AND SOFT TISSUES: Normal marrow signal. No soft tissue abnormality. IMPRESSION: 1. Acute right MCA territory infarct with mild associated edema and sulcal effacement. No midline shift. 2. Encephalomalacia and gliosis in the left parietal lobe compatible with remote infarct. 3. Small remote infarct in the right cerebellum. Electronically signed by: Donnice Mania MD 07/17/2024 11:49 AM EST RP Workstation:  [id number]   CT ANGIO HEAD NECK W WO CM Result Date: 07/17/2024 EXAM: CTA HEAD AND NECK WITH  AND WITHOUT 07/17/2024 05:00:19 AM TECHNIQUE: CTA of the head and neck was performed with and without the administration of 75 mL of intravenous contrast (iohexol  (OMNIPAQUE ) 350 MG/ML injection 75 mL IOHEXOL  350 MG/ML SOLN). Multiplanar 2D and/or 3D reformatted images are provided for review. Automated exposure control, iterative reconstruction, and/or weight based adjustment of the mA/kV was utilized to reduce the radiation dose to as low as reasonably achievable. Stenosis of the internal carotid arteries measured using NASCET criteria. COMPARISON: CT head 07/16/2024 and earlier study. CLINICAL HISTORY: 69 year old male with evidence of recent right MCA infarct on plain head CT yesterday. stroke/transient ischemic attack, determine embolic source. FINDINGS: CTA NECK: AORTIC ARCH AND ARCH VESSELS: Soft and calcified aortic arch atherosclerosis. 4-vessel arch configuration. The left vertebral artery arises directly from the arch (normal variant). No dissection or arterial injury. Brachiocephalic artery and right common carotid artery (CCA) calcified atherosclerosis without stenosis. Proximal right subclavian artery calcified atherosclerosis without stenosis. Soft and calcified atherosclerosis of the dominant right vertebral artery origin resulting in moderate stenosis (series 8 image 73). Nondominant left vertebral artery arises directly from the aortic arch with moderate to severe atherosclerosis and stenosis of its origin (series 7 image 176). The vessel remains patent. The left vertebral artery is diminutive but patent to the skull base. CERVICAL CAROTID ARTERIES: Retropharyngeal course of both carotid arteries, normal variant. Right carotid bifurcation, right internal carotid artery (ICA) origin and bulb soft and calcified plaque. Proximal right ICA 57% stenosis. Left common carotid artery (CCA) soft and calcified  atherosclerosis without stenosis. Bulky calcified atherosclerosis at the left carotid bifurcation and proximal left ICA. Proximal left ICA 63% stenosis. Left ICA remains patent. No dissection or arterial injury. CERVICAL VERTEBRAL ARTERIES: Additional right V1 and proximal V2 segment sclerosis with mild stenosis. Dominant right vertebral artery V4 segment calcified atherosclerosis results in mild right V4 segment stenosis. The left vertebral artery is diminutive but patent to the skull base. The left V4 segment is also severely atherosclerotic and irregular but the left posterior inferior cerebellar artery (PICA) origin and left vertebrobasilar junction remain patent (series 6 image 153). No dissection or arterial injury. LUNGS AND MEDIASTINUM: Negative visible superior mediastinum and lungs. SOFT TISSUES: Port in tissue. Other nonvascular neck soft tissue spaces appear negative. BONES: There is a large left of midline maxillary alveolus 17 mm area of bone erosion which appears likely to be odontogenic and inflammatory (series 4 image 89). No acute or suspicious osseous abnormality otherwise. CTA HEAD: ANTERIOR CIRCULATION: Right internal carotid artery (ICA) siphon is moderately to severely calcified with associated moderate supraclinoid stenosis on series 6 image 116 of the distal right ICA. Left ICA siphon is moderately to severely calcified and there is moderate stenosis at both the distal cavernous (series 6 image 124) and supraclinoid (series 6 image 116) segments. Normal right posterior communicating and right ophthalmic artery origins. Normal left ophthalmic artery origin. Normal middle cerebral artery (MCA) and anterior cerebral artery (ACA) origins. Dominant left and diminutive right ACA A1 segments. Normal anterior communicating artery. Severe left ACA stenosis at the distal A2 / A3 segment (series 10 image 15). Distal ACA branches remain patent. No left MCA stenosis. Right MCA M1 segment and right MCA  bifurcation is patent. Right MCA posterior M2 branch is normal and nearly occluded (series 8 image 60) along a segment of about 9 mm. There is some preserved downstream enhancement. No other right MCA branch occlusion. No aneurysm. POSTERIOR CIRCULATION: Fetal right posterior cerebral artery (PCA) origin. Normal superior  cerebellar artery (SCA) and left PCA origins. Mild to moderate left PCA P2 and P3 segment irregularity and stenosis (series 11 image 24). Similar mild more so than moderate right PCA distal P2 and P3 irregularity and stenosis (series 11 image 18). Patent basilar artery with mild irregularity but no stenosis. The left V4 segment is also severely atherosclerotic and irregular but the left PICA origin and left vertebrobasilar junction remain patent (series 6 image 153). Dominant right vertebral artery V4 segment calcified atherosclerosis results in mild right V4 segment stenosis. Normal right PICA origin. No aneurysm. OTHER: Superior sagittal sinus is enhancing and patent. IMPRESSION: 1. Positive for advanced Atherosclerosis with Near-Occlusion of posterior RMCA M2 branch (series 8 image 60), otherwise no LVO. 2. Severe atherosclerotic stenoses: Nondominant left vertebral artery origin and left V4 segments, left ACA A2/A3 junction. Moderate stenoses: Bilateral ICA siphons, dominant right vertebral artery origin. 3. Bilateral carotid bifurcation atherosclerosis with proximal ICA stenoses 63% on the left and 57% on the right. Aortic Atherosclerosis (ICD10-I70.0). Electronically signed by: Helayne Hurst MD 07/17/2024 05:20 AM EST RP Workstation: [id number]   CT HEAD WO CONTRAST Result Date: 07/16/2024 EXAM: CT HEAD WITHOUT CONTRAST 07/16/2024 08:41:11 PM TECHNIQUE: CT of the head was performed without the administration of intravenous contrast. Automated exposure control, iterative reconstruction, and/or weight based adjustment of the mA/kV was utilized to reduce the radiation dose to as low as reasonably  achievable. COMPARISON: Head CT from 05/23/2019. CLINICAL HISTORY: Neuro deficit, acute, stroke suspected. Acute neurologic deficit; stroke suspected. FINDINGS: BRAIN AND VENTRICLES: There is new parenchymal hypodensity, sulcal effacement, swelling and loss of gray-white matter differentiation consistent with an acute nonhemorrhagic infarct in the right parietooccipital area with partial extension into the posterior insula. The infarct measures 5.1 cm AP, 4.8 cm transverse, and 3.6 cm craniocaudal. Portions of the posterior right MCA territory and posterior cerebral artery territory are involved. There is encephalomalacia and gliosis in the high left frontoparietal area from a remote left MCA infarct. Mild atrophy and small vessel disease. Mild atrophic ventriculomegaly without midline shift or downward mass effect. No acute hemorrhage. No other acute infarct is seen. There are chronic small vessel changes in the pons. Brainstem and cerebellum show no acute abnormality. There is calcification in the distal vertebral arteries and siphons without an appreciable hyperdense vessel. No extra-axial collection. No mass effect or midline shift. ORBITS: No acute abnormality. SINUSES: Chronic membrane thickening and hyperostosis in the left sphenoid air cell. Other visualized sinuses and mastoids are clear. SOFT TISSUES AND SKULL: No acute soft tissue abnormality. No skull fracture. IMPRESSION: 1. Acute nonhemorrhagic infarct in the right parietooccipital area with partial extension into the posterior insula, involving portions of the posterior right MCA and posterior cerebral artery territory. 2. Encephalomalacia and gliosis in the high left frontoparietal area from a remote left MCA infarct. 3. Atrophy, small vessel changes, and vascular calcifications. 4. Discussed over the phone with Doctor Floy at 8:56 p.m., 07/16/2024, with verbal acknowledgement of findings. Electronically signed by: Francis Quam MD 07/16/2024  09:07 PM EST RP Workstation: [id number]      Subjective: Seen and examined the day of discharge.  Stable no distress.  Appropriate discharge home.  Discharge Exam: Vitals:   07/18/24 0335 07/18/24 0735  BP: (!) 156/85 (!) 144/90  Pulse: 81 85  Resp: 16 16  Temp: 97.8 F (36.6 C) 98.6 F (37 C)  SpO2: 97% 98%   Vitals:   07/17/24 2300 07/18/24 0223 07/18/24 0335 07/18/24 0735  BP: 127/78 (!) 171/92 ROLLEN)  156/85 (!) 144/90  Pulse: 83 73 81 85  Resp: 17 17 16 16   Temp: 97.7 F (36.5 C) 98.2 F (36.8 C) 97.8 F (36.6 C) 98.6 F (37 C)  TempSrc: Axillary Oral Oral Oral  SpO2: 97% 96% 97% 98%  Weight:   65 kg   Height:        General: Pt is alert, awake, not in acute distress Cardiovascular: RRR, S1/S2 +, no rubs, no gallops Respiratory: CTA bilaterally, no wheezing, no rhonchi Abdominal: Soft, NT, ND, bowel sounds + Extremities: no edema, no cyanosis    The results of significant diagnostics from this hospitalization (including imaging, microbiology, ancillary and laboratory) are listed below for reference.     Microbiology: No results found for this or any previous visit (from the past 240 hours).   Labs: BNP (last 3 results) Recent Labs    12/29/23 1245  BNP 308.3*   Basic Metabolic Panel: Recent Labs  Lab 07/16/24 1953 07/17/24 0500  NA 141 140  K 4.1 3.8  CL 111 111  CO2 18* 17*  GLUCOSE 101* 86  BUN 27* 24*  CREATININE 1.71* 1.57*  CALCIUM  9.0 8.1*   Liver Function Tests: Recent Labs  Lab 07/16/24 1953  AST 20  ALT 9  ALKPHOS 87  BILITOT 1.1  PROT 7.4  ALBUMIN 4.1   No results for input(s): LIPASE, AMYLASE in the last 168 hours. No results for input(s): AMMONIA in the last 168 hours. CBC: Recent Labs  Lab 07/16/24 1953 07/17/24 0500  WBC 8.9 8.4  NEUTROABS 6.5  --   HGB 16.8 15.3  HCT 49.6 45.4  MCV 94.3 95.0  PLT 129* 120*   Cardiac Enzymes: No results for input(s): CKTOTAL, CKMB, CKMBINDEX, TROPONINI in the  last 168 hours. BNP: Invalid input(s): POCBNP CBG: Recent Labs  Lab 07/16/24 1955  GLUCAP 85   D-Dimer No results for input(s): DDIMER in the last 72 hours. Hgb A1c Recent Labs    07/17/24 0055  HGBA1C 5.2   Lipid Profile Recent Labs    07/17/24 0500  CHOL 158  HDL 31*  LDLCALC 112*  TRIG 75  CHOLHDL 5.1   Thyroid function studies No results for input(s): TSH, T4TOTAL, T3FREE, THYROIDAB in the last 72 hours.  Invalid input(s): FREET3 Anemia work up No results for input(s): VITAMINB12, FOLATE, FERRITIN, TIBC, IRON, RETICCTPCT in the last 72 hours. Urinalysis    Component Value Date/Time   COLORURINE YELLOW (A) 05/19/2019 1059   APPEARANCEUR CLEAR (A) 05/19/2019 1059   LABSPEC 1.012 05/19/2019 1059   PHURINE 5.0 05/19/2019 1059   GLUCOSEU NEGATIVE 05/19/2019 1059   HGBUR NEGATIVE 05/19/2019 1059   BILIRUBINUR NEGATIVE 05/19/2019 1059   KETONESUR NEGATIVE 05/19/2019 1059   PROTEINUR NEGATIVE 05/19/2019 1059   NITRITE NEGATIVE 05/19/2019 1059   LEUKOCYTESUR NEGATIVE 05/19/2019 1059   Sepsis Labs Recent Labs  Lab 07/16/24 1953 07/17/24 0500  WBC 8.9 8.4   Microbiology No results found for this or any previous visit (from the past 240 hours).   Time coordinating discharge: 40 minutes  SIGNED:   Calvin KATHEE Robson, MD  [facility] 07/18/2024, 11:33 AM Pager   If 7PM-7AM, please contact night-coverage     [1] No Known Allergies
